# Patient Record
Sex: Female | Born: 1960 | Race: Black or African American | Hispanic: No | Marital: Married | Smoking: Former smoker
Health system: Southern US, Community
[De-identification: ages and names within clinical notes are randomized; demographics above are authoritative.]

## PROBLEM LIST (undated history)

## (undated) DIAGNOSIS — E119 Type 2 diabetes mellitus without complications: Secondary | ICD-10-CM

## (undated) DIAGNOSIS — I10 Essential (primary) hypertension: Secondary | ICD-10-CM

## (undated) DIAGNOSIS — K219 Gastro-esophageal reflux disease without esophagitis: Secondary | ICD-10-CM

## (undated) HISTORY — PX: APPENDECTOMY: SHX54

## (undated) HISTORY — DX: Gastro-esophageal reflux disease without esophagitis: K21.9

---

## 2015-09-19 ENCOUNTER — Encounter (HOSPITAL_COMMUNITY): Payer: Self-pay

## 2015-09-19 ENCOUNTER — Emergency Department (HOSPITAL_COMMUNITY)
Admission: EM | Admit: 2015-09-19 | Discharge: 2015-09-19 | Disposition: A | Discharge status: Home / Self Care | Payer: Medicaid Other | Attending: Emergency Medicine | Admitting: Emergency Medicine

## 2015-09-19 ENCOUNTER — Emergency Department (HOSPITAL_COMMUNITY): Payer: Medicaid Other

## 2015-09-19 DIAGNOSIS — M549 Dorsalgia, unspecified: Secondary | ICD-10-CM | POA: Insufficient documentation

## 2015-09-19 DIAGNOSIS — G4489 Other headache syndrome: Secondary | ICD-10-CM | POA: Diagnosis not present

## 2015-09-19 DIAGNOSIS — R51 Headache: Secondary | ICD-10-CM | POA: Diagnosis present

## 2015-09-19 LAB — COMPREHENSIVE METABOLIC PANEL
ALK PHOS: 46 U/L (ref 38–126)
ALT: 13 U/L — AB (ref 14–54)
AST: 20 U/L (ref 15–41)
Albumin: 3.5 g/dL (ref 3.5–5.0)
Anion gap: 10 (ref 5–15)
BUN: 7 mg/dL (ref 6–20)
CALCIUM: 9 mg/dL (ref 8.9–10.3)
CO2: 25 mmol/L (ref 22–32)
CREATININE: 0.72 mg/dL (ref 0.44–1.00)
Chloride: 103 mmol/L (ref 101–111)
Glucose, Bld: 90 mg/dL (ref 65–99)
Potassium: 3.4 mmol/L — ABNORMAL LOW (ref 3.5–5.1)
SODIUM: 138 mmol/L (ref 135–145)
Total Bilirubin: 0.8 mg/dL (ref 0.3–1.2)
Total Protein: 6.9 g/dL (ref 6.5–8.1)

## 2015-09-19 LAB — CBC
HCT: 38.6 % (ref 36.0–46.0)
HEMOGLOBIN: 12.8 g/dL (ref 12.0–15.0)
MCH: 28.4 pg (ref 26.0–34.0)
MCHC: 33.2 g/dL (ref 30.0–36.0)
MCV: 85.8 fL (ref 78.0–100.0)
Platelets: 255 10*3/uL (ref 150–400)
RBC: 4.5 MIL/uL (ref 3.87–5.11)
RDW: 12.1 % (ref 11.5–15.5)
WBC: 6.4 10*3/uL (ref 4.0–10.5)

## 2015-09-19 MED ORDER — METOCLOPRAMIDE HCL 5 MG/ML IJ SOLN
5.0000 mg | Freq: Once | INTRAMUSCULAR | Status: AC
  Administered 2015-09-19: 5 mg via INTRAVENOUS
  Filled 2015-09-19: qty 2

## 2015-09-19 MED ORDER — SODIUM CHLORIDE 0.9 % IV BOLUS (SEPSIS)
1000.0000 mL | Freq: Once | INTRAVENOUS | Status: AC
  Administered 2015-09-19: 1000 mL via INTRAVENOUS

## 2015-09-19 MED ORDER — DIPHENHYDRAMINE HCL 50 MG/ML IJ SOLN
25.0000 mg | Freq: Once | INTRAMUSCULAR | Status: AC
  Administered 2015-09-19: 25 mg via INTRAVENOUS
  Filled 2015-09-19: qty 1

## 2015-09-19 MED ORDER — BUTALBITAL-APAP-CAFFEINE 50-325-40 MG PO TABS: 1.0000 | ORAL_TABLET | Freq: Four times a day (QID) | ORAL | Status: AC | PRN

## 2015-09-19 NOTE — ED Notes (Signed)
MD at bedside. 

## 2015-09-19 NOTE — ED Provider Notes (Signed)
CSN: [id number]     Arrival date & time 09/19/15  1248 History   First MD Initiated Contact with Patient 09/19/15 1612     Chief Complaint  Patient presents with  . Headache  . Back Pain     (Consider location/radiation/quality/duration/timing/severity/associated sxs/prior Treatment) HPI Comments: Patient here complaining of headache 1 week which began after she sneezed. Has had these headaches before in the past when she was in a refugee For 20 years. Denies any fever or chills. Some mild blurred vision without light flashing. Denies any weakness in her arms or legs. No neck pain. Location of her headache is diffuse and is vaguely described. Symptoms wax and wane and nothing seems to make them better or worse. No treatment use prior to arrival.  Patient is a 55 y.o. female presenting with headaches and back pain. The history is provided by the patient. A language interpreter was used.  Headache Associated symptoms: back pain   Back Pain Associated symptoms: headaches     History reviewed. No pertinent past medical history. History reviewed. No pertinent past surgical history. No family history on file. Social History  Substance Use Topics  . Smoking status: Never Smoker   . Smokeless tobacco: None  . Alcohol Use: None   OB History    No data available     Review of Systems  Musculoskeletal: Positive for back pain.  Neurological: Positive for headaches.  All other systems reviewed and are negative.     Allergies  Review of patient's allergies indicates not on file.  Home Medications   Prior to Admission medications   Not on File   BP 131/74 mmHg  Pulse 75  Temp(Src) 97.5 F (36.4 C) (Oral)  Resp 20  SpO2 100% Physical Exam  Constitutional: She is oriented to person, place, and time. She appears well-developed and well-nourished.  Non-toxic appearance. No distress.  HENT:  Head: Normocephalic and atraumatic.  Eyes: Conjunctivae, EOM and lids are normal.  Pupils are equal, round, and reactive to light.  Neck: Normal range of motion. Neck supple. No tracheal deviation present. No thyroid mass present.  Cardiovascular: Normal rate, regular rhythm and normal heart sounds.  Exam reveals no gallop.   No murmur heard. Pulmonary/Chest: Effort normal and breath sounds normal. No stridor. No respiratory distress. She has no decreased breath sounds. She has no wheezes. She has no rhonchi. She has no rales.  Abdominal: Soft. Normal appearance and bowel sounds are normal. She exhibits no distension. There is no tenderness. There is no rebound and no CVA tenderness.  Musculoskeletal: Normal range of motion. She exhibits no edema or tenderness.  Neurological: She is alert and oriented to person, place, and time. She has normal strength. No cranial nerve deficit or sensory deficit. GCS eye subscore is 4. GCS verbal subscore is 5. GCS motor subscore is 6.  Skin: Skin is warm and dry. No abrasion and no rash noted.  Psychiatric: She has a normal mood and affect. Her speech is normal and behavior is normal.  Nursing note and vitals reviewed.   ED Course  Procedures (including critical care time) Labs Review Labs Reviewed  CBC  COMPREHENSIVE METABOLIC PANEL    Imaging Review No results found. I have personally reviewed and evaluated these images and lab results as part of my medical decision-making.   EKG Interpretation None      MDM   Final diagnoses:  None    Patient given Reglan and Benadryl and feels better. Repeat neurological  exam is stable. Will be given primary care referral as well as return precautions    Lacretia Leigh, MD 09/19/15 2120

## 2015-09-19 NOTE — ED Notes (Signed)
Patient here with headache and back pain x 1 week pain with ambulation.  Alert and oriented, denies visual changes, ambulatory all info from interpretor phone

## 2015-09-19 NOTE — ED Notes (Signed)
Interpreter line used for discharge instructions given by Md

## 2015-09-19 NOTE — Discharge Instructions (Signed)

## 2015-11-19 DIAGNOSIS — R1030 Lower abdominal pain, unspecified: Secondary | ICD-10-CM

## 2015-11-20 NOTE — Congregational Nurse Program (Signed)
Congregational Nurse Program Note  Date of Encounter: 11/19/2015  Past Medical History: No past medical history on file.  Encounter Details:     CNP Questionnaire - 11/20/15 2349    Patient Demographics   Is this a new or existing patient? New   Patient is considered a/an Refugee   Race African   Patient Assistance   Location of Patient Assistance Not Applicable   Patient's financial/insurance status Low Income;Medicaid   Uninsured Patient No   Patient referred to apply for the following financial assistance Not Applicable   Food insecurities addressed Not Applicable   Transportation assistance No   Assistance securing medications No   Educational health offerings Nutrition   Encounter Details   Primary purpose of visit [place]   Was an Emergency Department visit averted? Not Applicable   Does patient have a medical provider? No   Patient referred to Establish PCP   Was a mental health screening completed? (GAINS tool) No   Does patient have dental issues? No   Does patient have vision issues? No   Since previous encounter, have you referred patient for abnormal blood pressure that resulted in a new diagnosis or medication change? No   Since previous encounter, have you referred patient for abnormal blood glucose that resulted in a new diagnosis or medication change? No     11-19-15 Initial office visit for this lady speaking Swahili requesting assistance in establishing PCP for family including self, spouse and son. Medicaid information unavailable. Referral given from [facility] at [facility]. Complained of low back and low abdominal pain especially. Described problem with constipation. Due to clients limited time today, assessment incomplete. Needed more time for interpretation and Family's Medicaid information. Return 11-21-15 to schedule PCP and further assessment. Provide Medicaid copies.  Increase water intake. Jannetta Quint, RN/CN

## 2015-11-21 ENCOUNTER — Encounter (HOSPITAL_COMMUNITY): Payer: Self-pay | Admitting: *Deleted

## 2015-11-21 ENCOUNTER — Ambulatory Visit (HOSPITAL_COMMUNITY)
Admission: EM | Admit: 2015-11-21 | Discharge: 2015-11-21 | Disposition: A | Discharge status: Home / Self Care | Payer: Medicaid Other | Attending: Family Medicine | Admitting: Family Medicine

## 2015-11-21 DIAGNOSIS — F418 Other specified anxiety disorders: Secondary | ICD-10-CM

## 2015-11-21 DIAGNOSIS — R1030 Lower abdominal pain, unspecified: Secondary | ICD-10-CM

## 2015-11-21 DIAGNOSIS — IMO0001 Reserved for inherently not codable concepts without codable children: Secondary | ICD-10-CM

## 2015-11-21 DIAGNOSIS — F329 Major depressive disorder, single episode, unspecified: Secondary | ICD-10-CM

## 2015-11-21 DIAGNOSIS — F419 Anxiety disorder, unspecified: Principal | ICD-10-CM

## 2015-11-21 LAB — POCT I-STAT, CHEM 8
BUN: 13 mg/dL (ref 6–20)
CREATININE: 0.7 mg/dL (ref 0.44–1.00)
Calcium, Ion: 1.22 mmol/L (ref 1.12–1.23)
Chloride: 99 mmol/L — ABNORMAL LOW (ref 101–111)
Glucose, Bld: 90 mg/dL (ref 65–99)
HEMATOCRIT: 45 % (ref 36.0–46.0)
HEMOGLOBIN: 15.3 g/dL — AB (ref 12.0–15.0)
POTASSIUM: 3.7 mmol/L (ref 3.5–5.1)
Sodium: 140 mmol/L (ref 135–145)
TCO2: 28 mmol/L (ref 0–100)

## 2015-11-21 LAB — POCT URINALYSIS DIP (DEVICE)
Bilirubin Urine: NEGATIVE
GLUCOSE, UA: NEGATIVE mg/dL
Ketones, ur: NEGATIVE mg/dL
LEUKOCYTES UA: NEGATIVE
Nitrite: NEGATIVE
Protein, ur: NEGATIVE mg/dL
UROBILINOGEN UA: 0.2 mg/dL (ref 0.0–1.0)
pH: 6.5 (ref 5.0–8.0)

## 2015-11-21 MED ORDER — POLYETHYLENE GLYCOL 3350 17 G PO PACK: 17.0000 g | PACK | Freq: Every day | ORAL | Status: DC

## 2015-11-21 NOTE — Discharge Instructions (Signed)
Use medicine daily and see dr Mingo Amber for further checkup.

## 2015-11-21 NOTE — Congregational Nurse Program (Signed)
Congregational Nurse Program Note  Date of Encounter: 11/21/2015  Past Medical History: No past medical history on file.  Encounter Details:     CNP Questionnaire - 11/21/15 1634    Patient Demographics   Is this a new or existing patient? New   Patient is considered a/an Refugee   Race African   Patient Assistance   Location of Patient Assistance Not Applicable   Patient's financial/insurance status Medicaid   Uninsured Patient No   Patient referred to apply for the following financial assistance Not Applicable   Food insecurities addressed Not Applicable   Transportation assistance Yes   Type of Assistance Taxi Voucher Given   Assistance securing medications No   Educational health offerings Navigating the healthcare system;Medications   Encounter Details   Primary purpose of visit Acute Illness/Condition Visit   Was an Emergency Department visit averted? Not Applicable   Does patient have a medical provider? No   Patient referred to Establish PCP   Was a mental health screening completed? (GAINS tool) No   Does patient have dental issues? No   Was a dental referral made? Yes   Does patient have vision issues? No   Does your patient have an abnormal blood pressure today? No   Since previous encounter, have you referred patient for abnormal blood pressure that resulted in a new diagnosis or medication change? No   Does your patient have an abnormal blood glucose today? No   Since previous encounter, have you referred patient for abnormal blood glucose that resulted in a new diagnosis or medication change? No   Was there a life-saving intervention made? No     Received telephone call from [facility] on [facility], patient is being seen and does not have transportation home.  Patient was originally referred to Urgent Care by Jannetta Quint RN, CNP from [facility]. Traveled to Urgent Care to assist with patient needs.  Urgent Care discharge  instructions instructed patient to make an appointment with Dr. Annabell Sabal.  Via interpreter, scheduled appointment for Wednesday, Nov 27, 2015 at 2:00pm and reviewed with patient that her Rx would be picked up from pharmacy by Jannetta Quint and she would deliver it to her home.  Via interpreter, reviewed Rx administration instructions.  Patient verbalized understanding about next weeks doctor's appointment, how to take medication, how medication was going to be delivered to her home by [facility], she is to go see Jannetta Quint at the [place] next Tuesday and at that time she will receive a bus pass or taxi voucher to travel to her appointment on Wednesday and that we would be calling a taxi for her to take her home today.  Stayed with patient until Taxi arrived. Lessie Dings RN, [place]  [phone]

## 2015-11-21 NOTE — Congregational Nurse Program (Signed)
Congregational Nurse Program Note  Date of Encounter: 11/21/2015  Past Medical History: No past medical history on file.  Encounter Details:     CNP Questionnaire - 11/21/15 1436    Patient Demographics   Is this a new or existing patient? Existing   Patient is considered a/an Refugee   Race African   Patient Assistance   Patient's financial/insurance status Medicaid   Uninsured Patient No   Patient referred to apply for the following financial assistance Not Applicable   Food insecurities addressed Not Applicable   Transportation assistance Yes   Type of Assistance Taxi Voucher Given   Assistance securing medications No   Educational health offerings Navigating the healthcare system   Encounter Details   Primary purpose of visit Acute Illness/Condition Visit   Was an Emergency Department visit averted? Not Applicable   Does patient have a medical provider? No   Patient referred to Urgent Care   Was a mental health screening completed? (GAINS tool) No   Does patient have dental issues? No   Was a dental referral made? Yes   Does patient have vision issues? No   Since previous encounter, have you referred patient for abnormal blood pressure that resulted in a new diagnosis or medication change? No   Since previous encounter, have you referred patient for abnormal blood glucose that resulted in a new diagnosis or medication change? No     Return visit for this [place] speaking lady from the [place] as a [facility]. Continue to experience moderate to severe lower abdominal and back pain unresolved over several months. Thin and frail appearance with facial stress.  Pain felt upon pressure to lower abdomen. Denies nausea and vomiting. No history of surgery. "I feel like worms in my stomach, bubbling feeling." Hx of constipation. Had laboratory test in past. Unable to provide details. Resides with son and spouse. Return to continue follow-up and assistance scheduling family  PCP on May  09,2017. Refer to Urgent Care for evaluation today. Jannetta Quint, RN. [phone].

## 2015-11-21 NOTE — ED Notes (Signed)
PACIFIC    INTERPRETORS    UTILIZED

## 2015-11-21 NOTE — ED Notes (Addendum)
Pt  Reports       abd   Pain    And  Weakness      She  denys  Any  Fever  Or  Vomiting         She    Was  Referred  By    Congregational       Nurse          She  Reports  Back  Hurts                 She  Has  Only  Been  In  The  [place]  For  3       Months       She  Has  Been  C/o  Low  Back  And  Abdominal pain

## 2015-11-21 NOTE — ED Provider Notes (Signed)
CSN: [id number]     Arrival date & time 11/21/15  1340 History   First MD Initiated Contact with Patient 11/21/15 1431     Chief Complaint  Patient presents with  . Weakness   (Consider location/radiation/quality/duration/timing/severity/associated sxs/prior Treatment) Patient is a 55 y.o. female presenting with weakness. The history is provided by the patient. The history is limited by a language barrier. A language interpreter was used (swahili.).  Weakness This is a chronic problem. Episode onset: in refugee center [place]yr, in [place] for 55mos, current problems for prolonged period. Associated symptoms include chest pain, abdominal pain and headaches.    History reviewed. No pertinent past medical history. History reviewed. No pertinent past surgical history. History reviewed. No pertinent family history. Social History  Substance Use Topics  . Smoking status: Never Smoker   . Smokeless tobacco: None  . Alcohol Use: No   OB History    No data available     Review of Systems  Constitutional: Negative for fever.  Cardiovascular: Positive for chest pain.  Gastrointestinal: Positive for abdominal pain and constipation. Negative for nausea, vomiting and diarrhea.  Neurological: Positive for weakness and headaches.  All other systems reviewed and are negative.   Allergies  Review of patient's allergies indicates no known allergies.  Home Medications   Prior to Admission medications   Medication Sig Start Date End Date Taking? Authorizing Provider  butalbital-acetaminophen-caffeine (FIORICET) 50-325-40 MG tablet Take 1 tablet by mouth every 6 (six) hours as needed for headache. 09/19/15 09/18/16  Lacretia Leigh, MD  polyethylene glycol [facility] / Floria Raveling) packet Take 17 g by mouth daily. 11/21/15   Billy Fischer, MD   Meds Ordered and Administered this Visit  Medications - No data to display  BP 145/65 mmHg  Pulse 68  Temp(Src) 97.7 F (36.5 C) (Oral)  Resp 16  SpO2 100%  LMP   (Within Weeks) No data found.   Physical Exam  Constitutional: She is oriented to person, place, and time. She appears well-developed and well-nourished. No distress.  Neck: Normal range of motion. Neck supple.  Cardiovascular: Normal rate, regular rhythm, normal heart sounds and intact distal pulses.   Pulmonary/Chest: Effort normal and breath sounds normal.  Lymphadenopathy:    She has no cervical adenopathy.  Neurological: She is alert and oriented to person, place, and time.  Skin: Skin is warm and dry.  Nursing note and vitals reviewed.   ED Course  Procedures (including critical care time)  Labs Review Labs Reviewed  POCT URINALYSIS DIP (DEVICE) - Abnormal; Notable for the following:    Hgb urine dipstick TRACE (*)    All other components within normal limits  POCT I-STAT, CHEM 8 - Abnormal; Notable for the following:    Chloride 99 (*)    Hemoglobin 15.3 (*)    All other components within normal limits    Imaging Review No results found.   Visual Acuity Review  Right Eye Distance:   Left Eye Distance:   Bilateral Distance:    Right Eye Near:   Left Eye Near:    Bilateral Near:         MDM   1. Anxiety and depression        Billy Fischer, MD 11/21/15 1505

## 2015-11-26 NOTE — Congregational Nurse Program (Signed)
Congregational Nurse Program Note  Date of Encounter: 11/21/2015  Past Medical History: No past medical history on file.  Encounter Details:     CNP Questionnaire - 11/21/15 1634    Patient Demographics   Is this a new or existing patient? New   Patient is considered a/an Refugee   Race African   Patient Assistance   Location of Patient Assistance Not Applicable   Patient's financial/insurance status Medicaid   Uninsured Patient No   Patient referred to apply for the following financial assistance Not Applicable   Food insecurities addressed Not Applicable   Transportation assistance Yes   Type of Assistance Taxi Voucher Given   Assistance securing medications No   Educational health offerings Navigating the healthcare system;Medications   Encounter Details   Primary purpose of visit Acute Illness/Condition Visit   Was an Emergency Department visit averted? Not Applicable   Does patient have a medical provider? No   Patient referred to Establish PCP   Was a mental health screening completed? (GAINS tool) No   Does patient have dental issues? No   Was a dental referral made? Yes   Does patient have vision issues? No   Does your patient have an abnormal blood pressure today? No   Since previous encounter, have you referred patient for abnormal blood pressure that resulted in a new diagnosis or medication change? No   Does your patient have an abnormal blood glucose today? No   Since previous encounter, have you referred patient for abnormal blood glucose that resulted in a new diagnosis or medication change? No   Was there a life-saving intervention made? No      Transportation provided to client from [facility] to home. Prescription for Miralax/Glycolax packets taken to [facility], filled  and delivered to home. Instructions and details of med and purpose reviewed with client several times. Feedback indicated understanding. Appointment for Family Medicine scheduled for  Nov 27, 2015, 2:00 pm; [address] Assistance with transportation provided for appointment. Return to [place] office for taxi vouchers ON 11-26-15. Jannetta Quint, RN/CN. [phone].

## 2015-11-27 ENCOUNTER — Other Ambulatory Visit (HOSPITAL_COMMUNITY)
Admission: RE | Admit: 2015-11-27 | Discharge: 2015-11-27 | Disposition: A | Discharge status: Home / Self Care | Payer: Medicaid Other | Source: Ambulatory Visit | Attending: Family Medicine | Admitting: Family Medicine

## 2015-11-27 ENCOUNTER — Ambulatory Visit (HOSPITAL_COMMUNITY)
Admission: RE | Admit: 2015-11-27 | Discharge: 2015-11-27 | Disposition: A | Discharge status: Home / Self Care | Payer: Medicaid Other | Source: Ambulatory Visit | Attending: Family Medicine | Admitting: Family Medicine

## 2015-11-27 ENCOUNTER — Ambulatory Visit (INDEPENDENT_AMBULATORY_CARE_PROVIDER_SITE_OTHER): Payer: Medicaid Other | Admitting: Family Medicine

## 2015-11-27 VITALS — BP 121/71 | HR 83 | Temp 97.3°F | Ht 64.0 in | Wt 128.4 lb

## 2015-11-27 DIAGNOSIS — M545 Low back pain: Secondary | ICD-10-CM | POA: Diagnosis not present

## 2015-11-27 DIAGNOSIS — Z113 Encounter for screening for infections with a predominantly sexual mode of transmission: Secondary | ICD-10-CM | POA: Diagnosis not present

## 2015-11-27 DIAGNOSIS — M549 Dorsalgia, unspecified: Secondary | ICD-10-CM | POA: Insufficient documentation

## 2015-11-27 DIAGNOSIS — R1084 Generalized abdominal pain: Secondary | ICD-10-CM | POA: Diagnosis not present

## 2015-11-27 DIAGNOSIS — Z0289 Encounter for other administrative examinations: Secondary | ICD-10-CM

## 2015-11-27 DIAGNOSIS — R109 Unspecified abdominal pain: Secondary | ICD-10-CM | POA: Insufficient documentation

## 2015-11-27 DIAGNOSIS — R002 Palpitations: Secondary | ICD-10-CM

## 2015-11-27 DIAGNOSIS — L905 Scar conditions and fibrosis of skin: Secondary | ICD-10-CM

## 2015-11-27 DIAGNOSIS — G8929 Other chronic pain: Secondary | ICD-10-CM | POA: Insufficient documentation

## 2015-11-27 DIAGNOSIS — Z008 Encounter for other general examination: Secondary | ICD-10-CM | POA: Diagnosis present

## 2015-11-27 HISTORY — DX: Dorsalgia, unspecified: M54.9

## 2015-11-27 HISTORY — DX: Palpitations: R00.2

## 2015-11-27 HISTORY — DX: Scar conditions and fibrosis of skin: L90.5

## 2015-11-27 HISTORY — DX: Encounter for other administrative examinations: Z02.89

## 2015-11-27 LAB — CBC WITH DIFFERENTIAL/PLATELET
BASOS PCT: 0 %
Basophils Absolute: 0 cells/uL (ref 0–200)
Eosinophils Absolute: 54 cells/uL (ref 15–500)
Eosinophils Relative: 1 %
HCT: 40.8 % (ref 35.0–45.0)
Hemoglobin: 13.5 g/dL (ref 11.7–15.5)
LYMPHS PCT: 31 %
Lymphs Abs: 1674 cells/uL (ref 850–3900)
MCH: 28.8 pg (ref 27.0–33.0)
MCHC: 33.1 g/dL (ref 32.0–36.0)
MCV: 87.2 fL (ref 80.0–100.0)
MONOS PCT: 5 %
MPV: 9.9 fL (ref 7.5–12.5)
Monocytes Absolute: 270 cells/uL (ref 200–950)
Neutro Abs: 3402 cells/uL (ref 1500–7800)
Neutrophils Relative %: 63 %
PLATELETS: 262 10*3/uL (ref 140–400)
RBC: 4.68 MIL/uL (ref 3.80–5.10)
RDW: 13.4 % (ref 11.0–15.0)
WBC: 5.4 10*3/uL (ref 3.8–10.8)

## 2015-11-27 LAB — POCT H PYLORI SCREEN: H PYLORI SCREEN, POC: NEGATIVE

## 2015-11-27 LAB — TSH: TSH: 0.36 m[IU]/L — AB

## 2015-11-27 MED ORDER — ACETAMINOPHEN 500 MG PO TABS: 500.0000 mg | ORAL_TABLET | Freq: Four times a day (QID) | ORAL | Status: DC | PRN

## 2015-11-27 NOTE — Patient Instructions (Addendum)
Take the Tylenol for your back pain.   Come back in about a month.  We will have the results of your lab tests at that visit.
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
## 2015-11-28 LAB — HIV ANTIBODY (ROUTINE TESTING W REFLEX): HIV 1&2 Ab, 4th Generation: NONREACTIVE

## 2015-11-28 LAB — COMPREHENSIVE METABOLIC PANEL
ALK PHOS: 46 U/L (ref 33–130)
ALT: 14 U/L (ref 6–29)
AST: 17 U/L (ref 10–35)
Albumin: 4.1 g/dL (ref 3.6–5.1)
BILIRUBIN TOTAL: 0.3 mg/dL (ref 0.2–1.2)
BUN: 19 mg/dL (ref 7–25)
CHLORIDE: 103 mmol/L (ref 98–110)
CO2: 27 mmol/L (ref 20–31)
CREATININE: 0.86 mg/dL (ref 0.50–1.05)
Calcium: 9.2 mg/dL (ref 8.6–10.4)
Glucose, Bld: 73 mg/dL (ref 65–99)
Potassium: 3.9 mmol/L (ref 3.5–5.3)
SODIUM: 138 mmol/L (ref 135–146)
Total Protein: 7.1 g/dL (ref 6.1–8.1)

## 2015-11-28 LAB — HEPATITIS B SURFACE ANTIGEN: Hepatitis B Surface Ag: NEGATIVE

## 2015-11-28 LAB — RPR

## 2015-11-28 LAB — VARICELLA ZOSTER ANTIBODY, IGG: VARICELLA IGG: 510.2 {index} — AB (ref <135.00)

## 2015-11-28 LAB — BRAIN NATRIURETIC PEPTIDE: Brain Natriuretic Peptide: 23.1 pg/mL (ref <100)

## 2015-11-28 LAB — SICKLE CELL SCREEN: SICKLE CELL SCREEN: NEGATIVE

## 2015-11-29 LAB — URINE CYTOLOGY ANCILLARY ONLY
CHLAMYDIA, DNA PROBE: NEGATIVE
Neisseria Gonorrhea: NEGATIVE

## 2015-11-29 LAB — URINE CULTURE: Colony Count: 8000
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
## 2015-11-29 NOTE — Assessment & Plan Note (Signed)
RUQ pain on abdominal exam. Checking h pylori.  Fairly focal exam.  Depending on h pylori results, send for RUQ U/S.

## 2015-12-04 ENCOUNTER — Encounter: Payer: Self-pay | Admitting: Family Medicine

## 2015-12-11 DIAGNOSIS — R1030 Lower abdominal pain, unspecified: Secondary | ICD-10-CM

## 2015-12-11 NOTE — Congregational Nurse Program (Unsigned)
Congregational Nurse Program Note  Date of Encounter: 12/11/2015  Past Medical History: No past medical history on file.  Encounter Details:     CNP Questionnaire - 12/11/15 1405    Patient Demographics   Is this a new or existing patient? Existing   Patient is considered a/an Refugee   Race African   Patient Assistance   Location of Patient Assistance Not Applicable   Patient's financial/insurance status Low Income;Medicaid   Uninsured Patient No   Patient referred to apply for the following financial assistance Not Applicable   Food insecurities addressed Not Applicable   Transportation assistance No   Assistance securing medications No   Educational health offerings Medications   Encounter Details   Primary purpose of visit Education/Health Concerns   Was an Emergency Department visit averted? Not Applicable   Patient referred to Not Applicable   Was a mental health screening completed? (GAINS tool) No   Does patient have dental issues? Yes   Was a dental referral made? Yes   Does patient have vision issues? No   Does your patient have an abnormal blood pressure today? No   Since previous encounter, have you referred patient for abnormal blood pressure that resulted in a new diagnosis or medication change? No   Does your patient have an abnormal blood glucose today? No   Since previous encounter, have you referred patient for abnormal blood glucose that resulted in a new diagnosis or medication change? No   Was there a life-saving intervention made? No         Amb Nursing Assessment - 11/27/15 1445    Pre-visit preparation   Pre-visit preparation completed Yes   Abuse/Neglect Assessment   Do you feel unsafe in your current relationship? No   Do you feel physically threatened by others? No   Anyone hurting you at home, work, or school? No   Investment banker, operational Needed? Yes   Deborah Sampson   Interpreter ID  [phone]     Office visit for this lady Refuge from [facility].Expressed concern that recent medications prescribed causing drowsiness. "I feel sleepy all the time." Concerns about safety if sleeping too much while spouse away working at night. Requesting to d/c medications until week-end. Completed dosing of Myralax/Glycolax. Reports abdomen feeling much better. Overall expression improved and smiling without facial tension visible as before. Explained use of medications can be used as needed for headache every 6 hours versus routinely. Stressed drinking 4-6 or more glasses water daily,increase fresh veggies and fruits in diet.Return to see nurse 12-17-15 to review status. Jannetta Quint, RN/CN . [phone]

## 2016-01-14 DIAGNOSIS — R1030 Lower abdominal pain, unspecified: Secondary | ICD-10-CM

## 2016-01-14 NOTE — Congregational Nurse Program (Signed)
Congregational Nurse Program Note  Date of Encounter: 01/14/2016  Past Medical History: No past medical history on file.  Encounter Details:     CNP Questionnaire - 01/14/16 1435    Patient Demographics   Is this a new or existing patient? Existing   Patient is considered a/an Refugee   Race African   Patient Assistance   Location of Patient Assistance Not Applicable   Patient's financial/insurance status Low Income;Medicaid   Uninsured Patient No   Patient referred to apply for the following financial assistance Not Applicable   Food insecurities addressed Not Applicable   Transportation assistance No   Assistance securing medications No   Educational health offerings Medications   Encounter Details   Primary purpose of visit Education/Health Concerns   Was an Emergency Department visit averted? Not Applicable   Does patient have a medical provider? No   Patient referred to Not Applicable   Was a mental health screening completed? (GAINS tool) No   Does patient have dental issues? Yes   Does patient have vision issues? No   Does your patient have an abnormal blood pressure today? No   Since previous encounter, have you referred patient for abnormal blood pressure that resulted in a new diagnosis or medication change? No   Does your patient have an abnormal blood glucose today? No   Since previous encounter, have you referred patient for abnormal blood glucose that resulted in a new diagnosis or medication change? No   Was there a life-saving intervention made? No     Office visit to follow-up recent dental appointment. Appointment scheduled January 02, 2016 for dental care cancelled due to absence of interpreter. Family members unavailable to accompany to appointment due to work schedule. Reports feeling better after PCP visit and medication for constipation. Constipation continues to occur. Using Tylenol for pain on week-ends to avoid drowsiness while attending school and during  time home alone while husband working. Expressed concern about safety if in " deep sleep". Follow-up appointment scheduled with Dr. [facility] on January 24, 2016 at 10 am for re-evaluation of abdominal pain and medication. Jannetta Quint, RN/CN.

## 2016-01-24 ENCOUNTER — Ambulatory Visit (INDEPENDENT_AMBULATORY_CARE_PROVIDER_SITE_OTHER): Payer: Medicaid Other | Admitting: Family Medicine

## 2016-01-24 ENCOUNTER — Encounter: Payer: Self-pay | Admitting: Family Medicine

## 2016-01-24 VITALS — BP 131/62 | HR 70 | Temp 97.9°F | Ht 64.0 in | Wt 134.2 lb

## 2016-01-24 DIAGNOSIS — K5900 Constipation, unspecified: Secondary | ICD-10-CM | POA: Insufficient documentation

## 2016-01-24 DIAGNOSIS — K5901 Slow transit constipation: Secondary | ICD-10-CM | POA: Diagnosis not present

## 2016-01-24 DIAGNOSIS — R002 Palpitations: Secondary | ICD-10-CM

## 2016-01-24 DIAGNOSIS — K5909 Other constipation: Secondary | ICD-10-CM | POA: Insufficient documentation

## 2016-01-24 LAB — TSH: TSH: 0.7 m[IU]/L

## 2016-01-24 MED ORDER — DOCUSATE SODIUM 100 MG PO CAPS: 100.0000 mg | ORAL_CAPSULE | Freq: Two times a day (BID) | ORAL | Status: DC

## 2016-01-24 NOTE — Assessment & Plan Note (Signed)
To continue Miralax.  Can increase to 2-3 x daily if needed. Also adding colace. She needs colonoscopy based on age.  She is following up with me in 1 month and we'll discuss further at that visit.

## 2016-01-24 NOTE — Patient Instructions (Addendum)
We are rechecking your thyroid and I will call you with the results.  Take the colace 2 pills in the morning to help with constipation.  If you are still struggling, you can take 2 or 3 glasses of the powder to help you go.  Come back and see me in a month and we'll talk about a colonoscopy.

## 2016-01-24 NOTE — Progress Notes (Signed)
Subjective:   [place] interpreter [place] [phone] today.  Deborah Sampson is a 55 y.o. female who presents to [facility] today for lower abdominal pain:  1.  Constipation:  Not particularly better.  Going every 2-3 days at times.  Difficult for her to use the bathroom when she tries.  Very small amounts, denies any melena or hematochezia.  States she is using her medicine, but only using one glass per day.  Not taking anything else for relief.    2.  Palpitations:  Persists.  Worse during constipation.   No shortness of breath or chest pain.  Occurs once every 2-3 weeks, except for straining with constipation, which is more frequent.  No heat/cold intolerance.    ROS as above per HPI, otherwise neg.   The following portions of the patient's history were reviewed and updated as appropriate: allergies, current medications, past medical history, family and social history, and problem list. Patient is a nonsmoker.    PMH reviewed.  No past medical history on file. No past surgical history on file.  Medications reviewed. Current Outpatient Prescriptions  Medication Sig Dispense Refill  . acetaminophen (TYLENOL) 500 MG tablet Take 1 tablet (500 mg total) by mouth every 6 (six) hours as needed. 30 tablet 0  . butalbital-acetaminophen-caffeine (FIORICET) 50-325-40 MG tablet Take 1 tablet by mouth every 6 (six) hours as needed for headache. 20 tablet 0  . polyethylene glycol (MIRALAX / GLYCOLAX) packet Take 17 g by mouth daily. 14 each 0   No current facility-administered medications for this visit.     Objective:   Physical Exam Ht 5\' 4"  (1.626 m)  Wt 134 lb 3.2 oz (60.873 kg)  BMI 23.02 kg/m2  LMP 01/24/2016 Gen:  Alert, cooperative patient who appears stated age in no acute distress.  Vital signs reviewed. HEENT: EOMI,  MMM Neck:  No thyroid nodules Cardiac:  Regular rate and rhythm without murmur auscultated.  Good S1/S2. Pulm:  Clear to auscultation bilaterally with good air movement.   No wheezes or rales noted.   Ext:  No edema.   No results found for this or any previous visit (from the past 72 hour(s)).

## 2016-01-24 NOTE — Assessment & Plan Note (Signed)
Rechecking TSH today.  If still low, will need treatment.  Likely cause of palpitations.  Normal EKG last visit.

## 2016-01-25 LAB — HEPATITIS C ANTIBODY: HCV Ab: NEGATIVE

## 2016-01-27 ENCOUNTER — Encounter: Payer: Self-pay | Admitting: Family Medicine

## 2016-01-28 DIAGNOSIS — R1030 Lower abdominal pain, unspecified: Secondary | ICD-10-CM

## 2016-01-28 DIAGNOSIS — IMO0001 Reserved for inherently not codable concepts without codable children: Secondary | ICD-10-CM

## 2016-01-28 NOTE — Congregational Nurse Program (Signed)
Congregational Nurse Program Note  Date of Encounter: 01/28/2016  Past Medical History: No past medical history on file.  Encounter Details:     CNP Questionnaire - 01/28/16 1100    Patient Demographics   Is this a new or existing patient? Existing   Patient is considered a/an Refugee   Race African   Patient Assistance   Location of Patient Assistance Not Applicable   Patient's financial/insurance status Low Income;Medicaid   Uninsured Patient No   Patient referred to apply for the following financial assistance Not Applicable   Food insecurities addressed Not Applicable   Transportation assistance No   Assistance securing medications No   Educational health offerings Medications   Encounter Details   Primary purpose of visit Education/Health Concerns   Was an Emergency Department visit averted? Not Applicable   Does patient have a medical provider? No   Patient referred to Not Applicable   Was a mental health screening completed? (GAINS tool) No   Does patient have dental issues? Yes   Was a dental referral made? Yes   Does patient have vision issues? No   Does your patient have an abnormal blood pressure today? No   Since previous encounter, have you referred patient for abnormal blood pressure that resulted in a new diagnosis or medication change? No   Does your patient have an abnormal blood glucose today? No   Since previous encounter, have you referred patient for abnormal blood glucose that resulted in a new diagnosis or medication change? No   Was there a life-saving intervention made? No         Amb Nursing Assessment - 01/24/16 1036    Pre-visit preparation   Pre-visit preparation completed Yes   Abuse/Neglect Assessment   Do you feel unsafe in your current relationship? No   Do you feel physically threatened by others? No   Anyone hurting you at home, work, or school? 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 Reports abdominal pain and constipation improved with Colace, but prefers to have liquid medication, Miralax originally provided. Main concern today is to receive assistance in scheduling a dental appointment for tooth pain before Medicaid expires. Last appointment canceled due to absence of interpreter.  Plan: Cautioned regarding use of frequent laxatives and importance of taking Colace as directed. Drink 8 plus glasses of water daily and increase fiber in diet; less rice.  Appointment scheduled with Dr.Green; January 30, 2016 at 9:00 am. Request family member to accompany and interpret. Jannetta Quint, RN/CN

## 2016-01-30 DIAGNOSIS — K029 Dental caries, unspecified: Secondary | ICD-10-CM

## 2016-02-03 NOTE — Congregational Nurse Program (Signed)
Congregational Nurse Program Note  Date of Encounter: 01/30/2016  Past Medical History: No past medical history on file.  Encounter Details:     CNP Questionnaire - 01/30/16 0930    Patient Demographics   Is this a new or existing patient? Existing   Patient is considered a/an Refugee   Race African   Patient Assistance   Location of Patient Assistance Not Applicable   Patient's financial/insurance status Low Income;Medicaid   Uninsured Patient No   Patient referred to apply for the following financial assistance Not Applicable   Food insecurities addressed Not Applicable   Transportation assistance No   Assistance securing medications No   Educational health offerings Medications   Encounter Details   Primary purpose of visit Education/Health Concerns   Was an Emergency Department visit averted? Not Applicable   Does patient have a medical provider? No   Patient referred to Not Applicable   Was a mental health screening completed? (GAINS tool) No   Does patient have dental issues? Yes   Was a dental referral made? Yes   Does patient have vision issues? No   Does your patient have an abnormal blood pressure today? No   Since previous encounter, have you referred patient for abnormal blood pressure that resulted in a new diagnosis or medication change? No   Does your patient have an abnormal blood glucose today? No   Since previous encounter, have you referred patient for abnormal blood glucose that resulted in a new diagnosis or medication change? No   Was there a life-saving intervention made? No         Amb Nursing Assessment - 01/24/16 1036    Pre-visit preparation   Pre-visit preparation completed Yes   Abuse/Neglect Assessment   Do you feel unsafe in your current relationship? No   Do you feel physically threatened by others? No   Anyone hurting you at home, work, or school? No   Investment banker, operational Needed? Yes   [place]   Interpreter Name [place]   Interpreter ID [phone]     Follow-up with dentist, Dr. Rolly Salter office for appointment scheduled today at 9:00 am. Office contacted by family member, but unable to provide interpreter at time of appointment. Requested appointment be rescheduled. Jannetta Quint, RN/CN

## 2016-09-28 ENCOUNTER — Ambulatory Visit (INDEPENDENT_AMBULATORY_CARE_PROVIDER_SITE_OTHER): Payer: Medicaid Other | Admitting: Family Medicine

## 2016-09-28 ENCOUNTER — Encounter: Payer: Self-pay | Admitting: Family Medicine

## 2016-09-28 DIAGNOSIS — K5901 Slow transit constipation: Secondary | ICD-10-CM

## 2016-09-28 MED ORDER — POLYETHYLENE GLYCOL 3350 17 G PO PACK: 17.0000 g | PACK | Freq: Every day | ORAL | 0 refills | Status: DC

## 2016-09-28 MED ORDER — MINERAL OIL RE ENEM: 1.0000 | ENEMA | Freq: Once | RECTAL | 0 refills | Status: AC

## 2016-09-28 NOTE — Progress Notes (Signed)
   Subjective:    Patient ID: Deborah Sampson , female   DOB: 12-15-1960 , 56 y.o..   MRN: [id number]  HPI  Euphemia Lingerfelt is here for a same day visit for stomach pain  This visit was performed with a [place] phone interpreter  1. Stomach pain: Patient notes that for about 6 days she has had left-sided abdominal pain. She thinks that this is related to sometimes her body feeling hot and achy. She has had no sick contacts. She admits to nausea but no vomiting. Admits to feeling bloated. Has not tried any new foods recently. Denies any diarrhea. Admits to constipation and notes that her last bowel movement was 2 days ago and was small and dry. She has a history of constipation and has been taking MiraLAX in the past but has not taken any recently. No hematochezia or melena stools. Her by mouth intake has been decreased because she feels so bloated. She is mostly drinking milk. Denies shortness of breath, chest pain.  Review of Systems: Per HPI. All other systems reviewed and are negative.  Social Hx:  reports that she has never smoked. She has never used smokeless tobacco.   Objective:   BP 124/76   Pulse 74   Temp 97.7 F (36.5 C) (Oral)   Ht 5\' 4"  (1.626 m)   Wt 152 lb 12.8 oz (69.3 kg)   SpO2 98%   BMI 26.23 kg/m  Physical Exam  Gen: NAD, alert, cooperative with exam, well-appearing HEENT: NCAT, PERRL, clear conjunctiva, oropharynx clear, supple neck Cardiac: Regular rate and rhythm, no edema, capillary refill brisk  Respiratory: Clear to auscultation bilaterally, no wheezes, non-labored breathing Gastrointestinal: soft, tender to deep palpation in the left upper quadrant, nontender everywhere else, non distended, bowel sounds present Skin: no rashes, normal turgor  Neurological: no gross deficits.  Psych: good insight, normal mood and affect  Assessment & Plan:  Constipation Abdominal pain likely due to constipation. Abdominal exam reveals no peritoneal signs and no  signs of acute abdomen. -Discussed MiraLAX cleanout with patient in detail.  -Advised to use a daily MiraLAX afterwards.  -Discussed that if she still has not had a bowel movement after using MiraLAX and to use an enema. -If she has still not had a bowel movement after an enema return to clinic next -paper prescriptions for MiraLAX and enema provided  Meds ordered this encounter  Medications  . polyethylene glycol (MIRALAX / GLYCOLAX) packet    Sig: Take 17 g by mouth daily.    Dispense:  100 each    Refill:  0  . mineral oil enema    Sig: Place 133 mLs (1 enema total) rectally once.    Dispense:  133 mL    Refill:  0    Smitty Cords, MD [place], PGY-2

## 2016-09-28 NOTE — Patient Instructions (Addendum)
You have constipation which is hard stools that are difficult to pass. It is important to have regular bowel movements every 1-3 days that are soft and easy to pass. Hard stools increase your risk of hemorrhoids and are very uncomfortable.   To prevent constipation you can increase the amount of fiber in your diet. Examples of foods with fiber are leafy greens, whole grain breads, oatmeal and other grains.  It is also important to drink at least eight 8oz glass of water everyday.   If you have not has a bowel movement in 4-5 days you made need to clean out your bowel.  This will have establish normal movement through your bowel.    Miralax Clean out  Take 8 capfuls of miralax in 64 oz of gatorade. You can use any fluid that appeals to you (gatorade, water, juice)  Continue to drink at least eight 8 oz glasses of water throughout the day  You can repeat with another 8 packets of miralax in 64 oz of gatorade if you are not having a large amount of stools  You will need to be at home and close to a bathroom for about 8 hours when you do the above as you may need to go to the bathroom frequently.   If that does not work, you can try the enema  If the enema does not work, come back to the clinic.  After you are cleaned out: - Start Miralax once daily - if you are having diarrhea you can reduce Miralax

## 2016-09-28 NOTE — Assessment & Plan Note (Signed)
Abdominal pain likely due to constipation. Abdominal exam reveals no peritoneal signs and no signs of acute abdomen. -Discussed MiraLAX cleanout with patient in detail.  -Advised to use a daily MiraLAX afterwards.  -Discussed that if she still has not had a bowel movement after using MiraLAX and to use an enema. -If she has still not had a bowel movement after an enema return to clinic next -paper prescriptions for MiraLAX and enema provided

## 2016-12-15 ENCOUNTER — Ambulatory Visit (INDEPENDENT_AMBULATORY_CARE_PROVIDER_SITE_OTHER): Payer: BLUE CROSS/BLUE SHIELD | Admitting: Family Medicine

## 2016-12-15 ENCOUNTER — Encounter: Payer: Self-pay | Admitting: Family Medicine

## 2016-12-15 VITALS — BP 118/60 | HR 68 | Temp 98.1°F | Ht 64.0 in | Wt 156.0 lb

## 2016-12-15 DIAGNOSIS — R519 Headache, unspecified: Secondary | ICD-10-CM

## 2016-12-15 DIAGNOSIS — R51 Headache: Secondary | ICD-10-CM

## 2016-12-15 DIAGNOSIS — G4489 Other headache syndrome: Secondary | ICD-10-CM

## 2016-12-15 HISTORY — DX: Headache, unspecified: R51.9

## 2016-12-15 MED ORDER — KETOROLAC TROMETHAMINE 30 MG/ML IJ SOLN
30.0000 mg | Freq: Once | INTRAMUSCULAR | Status: AC
  Administered 2016-12-15: 30 mg via INTRAMUSCULAR

## 2016-12-15 MED ORDER — PROMETHAZINE HCL 25 MG PO TABS
25.0000 mg | ORAL_TABLET | Freq: Once | ORAL | Status: AC
Start: 2016-12-15 — End: 2016-12-15
  Administered 2016-12-15: 25 mg via ORAL

## 2016-12-15 MED ORDER — IBUPROFEN 400 MG PO TABS: 400.0000 mg | ORAL_TABLET | Freq: Three times a day (TID) | ORAL | 0 refills | Status: DC | PRN

## 2016-12-15 NOTE — Patient Instructions (Signed)
Cluster Headache Cluster headaches are deeply painful. They normally occur on one side of your head, but they may switch sides. Often, cluster headaches:  Are severe.  Happen often for a few weeks or months and then go away for a while.  Last from 15 minutes to 3 hours.  Happen at the same time each day.  Happen at night.  Happen many times a day.  Follow these instructions at home: Follow instructions from your doctor to care for yourself at home:  Go to bed at the same time each night. Get the same amount of sleep every night.  Avoid alcohol.  Stop smoking if you smoke. This includes cigarettes and e-cigarettes.  Take over-the-counter and prescription medicines only as told by your doctor.  Do not drive or use heavy machinery while taking prescription pain medicine.  Use oxygen as told by your doctor.  Exercise regularly.  Eat a healthy diet.  Write down when each headache happened, what kind of pain you had, how bad your pain was, and what you tried to help your pain. This is called a headache diary. Use it as told by your doctor.  Contact a doctor if:  Your headaches get worse or they happen more often.  Your medicines are not helping. Get help right away if:  You pass out (faint).  You get weak or lose feeling (have numbness) on one side of your body or face.  You see two of everything (double vision).  You feel sick to your stomach (nauseous) or you throw up (vomit), and you do not stop after many hours.  You have trouble with your balance or with walking.  You have trouble talking.  You have neck pain or stiffness.  You have a fever. This information is not intended to replace advice given to you by your health care provider. Make sure you discuss any questions you have with your health care provider. Document Released: 08/13/2004 Document Revised: 03/13/2016 Document Reviewed: 03/13/2016 Elsevier Interactive Patient Education  2017 Elsevier  Inc.  

## 2016-12-15 NOTE — Progress Notes (Signed)
Subjective:     Patient ID: Deborah Sampson, female   DOB: 01-02-1961, 56 y.o.   MRN: [id number]  Headache   This is a new problem. The current episode started in the past 7 days (Started 3-4 days ago). The problem occurs intermittently. The problem has been waxing and waning. The pain is located in the bilateral, frontal, parietal and temporal region. Radiates to: back of her neck. The pain quality is similar to prior headaches (She has had headache in the past but this is worse. She has recurrent headache at baseline but worsened recently). The quality of the pain is described as sharp and aching. The pain is at a severity of 8/10. The pain is moderate. Associated symptoms include neck pain. Pertinent negatives include no anorexia, blurred vision, dizziness, eye pain, fever, hearing loss, loss of balance, phonophobia, photophobia, seizures, visual change, vomiting or weakness. Associated symptoms comments: Feels heaviness in her head. Feels tired, also feels weak inher eyes.. Nothing aggravates the symptoms. She has tried nothing for the symptoms. The treatment provided no relief.   Current Outpatient Prescriptions on File Prior to Visit  Medication Sig Dispense Refill  . acetaminophen (TYLENOL) 500 MG tablet Take 1 tablet (500 mg total) by mouth every 6 (six) hours as needed. 30 tablet 0  . docusate sodium (COLACE) 100 MG capsule Take 1 capsule (100 mg total) by mouth 2 (two) times daily. 60 capsule 2  . polyethylene glycol (MIRALAX / GLYCOLAX) packet Take 17 g by mouth daily. 100 each 0   No current facility-administered medications on file prior to visit.    No past medical history on file.   Review of Systems  Constitutional: Negative for fever.  HENT: Negative for hearing loss.   Eyes: Negative for blurred vision, photophobia and pain.  Respiratory: Negative.   Cardiovascular: Negative.   Gastrointestinal: Negative.  Negative for anorexia and vomiting.  Musculoskeletal: Positive for neck  pain.  Neurological: Positive for headaches. Negative for dizziness, seizures, weakness and loss of balance.  All other systems reviewed and are negative.  Vitals:   12/15/16 1057  BP: 118/60  Pulse: 68  Temp: 98.1 F (36.7 C)  TempSrc: Oral  SpO2: 98%  Weight: 156 lb (70.8 kg)  Height: 5\' 4"  (1.626 m)       Objective:   Physical Exam  Constitutional: She is oriented to person, place, and time. She appears well-developed. No distress.  HENT:  Head: Normocephalic and atraumatic.  Right Ear: Tympanic membrane, external ear and ear canal normal.  Left Ear: Tympanic membrane, external ear and ear canal normal.  Mouth/Throat: Oropharynx is clear and moist.  Eyes: Conjunctivae, EOM and lids are normal. Pupils are equal, round, and reactive to light. Right pupil is reactive. Left pupil is reactive.  Fundoscopic exam:      The right eye shows no papilledema.       The left eye shows no papilledema.    Neck: Neck supple. No neck rigidity. No Brudzinski's sign and no Kernig's sign noted. No thyroid mass present.  Cardiovascular: Normal rate, regular rhythm and normal heart sounds.   No murmur heard. Pulmonary/Chest: Effort normal and breath sounds normal. No respiratory distress. She has no wheezes.  Abdominal: Bowel sounds are normal. She exhibits no mass. There is no tenderness.  Musculoskeletal: Normal range of motion. She exhibits no edema.  Neurological: She is alert and oriented to person, place, and time. She has normal strength and normal reflexes. She displays no tremor. No cranial  nerve deficit or sensory deficit. She displays no Babinski's sign on the right side. She displays no Babinski's sign on the left side.  Nursing note and vitals reviewed.      Assessment:     Headache     Plan:     Check problem list   More than 50% of the 45 min face to face encounter was spent on counseling and coordination of care.

## 2016-12-15 NOTE — Assessment & Plan Note (Addendum)
Old record reviewed. Seems to have had recurrent headache in the past. Lightly migraine vs stress induced headache. No red flags. No neurologic deficit on physical exam. No signs of meningeal irritation. Phenergan 25 mg oral and Toradol 30 mg IM given during today's visit.  She was monitored for few min and she endorsed some improvement of her symptoms. Indication for ED visit discussed. F/U with PCP recommended to reevaluate and consider prophylactic treatment for her headache vs need for imaging. In the mean time use Ibuprofen as needed for headache. She verbalized understanding. Interpreter used.

## 2016-12-21 ENCOUNTER — Ambulatory Visit (HOSPITAL_COMMUNITY)
Admission: EM | Admit: 2016-12-21 | Discharge: 2016-12-21 | Disposition: A | Discharge status: Home / Self Care | Payer: BLUE CROSS/BLUE SHIELD | Attending: Emergency Medicine | Admitting: Emergency Medicine

## 2016-12-21 ENCOUNTER — Encounter (HOSPITAL_COMMUNITY): Payer: Self-pay | Admitting: Emergency Medicine

## 2016-12-21 DIAGNOSIS — R1031 Right lower quadrant pain: Secondary | ICD-10-CM | POA: Diagnosis present

## 2016-12-21 DIAGNOSIS — D259 Leiomyoma of uterus, unspecified: Secondary | ICD-10-CM | POA: Diagnosis not present

## 2016-12-21 DIAGNOSIS — R1030 Lower abdominal pain, unspecified: Secondary | ICD-10-CM | POA: Diagnosis not present

## 2016-12-21 LAB — POCT URINALYSIS DIP (DEVICE)
BILIRUBIN URINE: NEGATIVE
Glucose, UA: NEGATIVE mg/dL
HGB URINE DIPSTICK: NEGATIVE
Leukocytes, UA: NEGATIVE
Nitrite: NEGATIVE
PH: 6 (ref 5.0–8.0)
Protein, ur: NEGATIVE mg/dL
SPECIFIC GRAVITY, URINE: 1.02 (ref 1.005–1.030)
Urobilinogen, UA: 0.2 mg/dL (ref 0.0–1.0)

## 2016-12-21 LAB — COMPREHENSIVE METABOLIC PANEL
ALBUMIN: 3.8 g/dL (ref 3.5–5.0)
ALT: 17 U/L (ref 14–54)
AST: 26 U/L (ref 15–41)
Alkaline Phosphatase: 54 U/L (ref 38–126)
Anion gap: 10 (ref 5–15)
BILIRUBIN TOTAL: 0.8 mg/dL (ref 0.3–1.2)
BUN: 10 mg/dL (ref 6–20)
CO2: 24 mmol/L (ref 22–32)
Calcium: 8.9 mg/dL (ref 8.9–10.3)
Chloride: 100 mmol/L — ABNORMAL LOW (ref 101–111)
Creatinine, Ser: 0.74 mg/dL (ref 0.44–1.00)
GFR calc Af Amer: >60 mL/min (ref >60)
GFR calc non Af Amer: >60 mL/min (ref >60)
GLUCOSE: 83 mg/dL (ref 65–99)
POTASSIUM: 4 mmol/L (ref 3.5–5.1)
Sodium: 134 mmol/L — ABNORMAL LOW (ref 135–145)
TOTAL PROTEIN: 7.1 g/dL (ref 6.5–8.1)

## 2016-12-21 LAB — CBC WITH DIFFERENTIAL/PLATELET
BASOS ABS: 0 10*3/uL (ref 0.0–0.1)
BASOS PCT: 0 %
Eosinophils Absolute: 0 10*3/uL (ref 0.0–0.7)
Eosinophils Relative: 1 %
HEMATOCRIT: 40.6 % (ref 36.0–46.0)
HEMOGLOBIN: 13.7 g/dL (ref 12.0–15.0)
Lymphocytes Relative: 32 %
Lymphs Abs: 2.1 10*3/uL (ref 0.7–4.0)
MCH: 29 pg (ref 26.0–34.0)
MCHC: 33.7 g/dL (ref 30.0–36.0)
MCV: 85.8 fL (ref 78.0–100.0)
Monocytes Absolute: 0.4 10*3/uL (ref 0.1–1.0)
Monocytes Relative: 6 %
NEUTROS ABS: 4.1 10*3/uL (ref 1.7–7.7)
NEUTROS PCT: 61 %
Platelets: 300 10*3/uL (ref 150–400)
RBC: 4.73 MIL/uL (ref 3.87–5.11)
RDW: 12.5 % (ref 11.5–15.5)
WBC: 6.6 10*3/uL (ref 4.0–10.5)

## 2016-12-21 LAB — POCT PREGNANCY, URINE: PREG TEST UR: NEGATIVE

## 2016-12-21 NOTE — ED Triage Notes (Signed)
The patient presented to the [facility] with a complaint of abdominal pain and a headache x 2 weeks.

## 2016-12-21 NOTE — ED Triage Notes (Signed)
Pt c/o lower abd pain and headache onset earlier today.  Pt denies any nausea or vomiting

## 2016-12-21 NOTE — ED Provider Notes (Signed)
CSN: [id number]     Arrival date & time 12/21/16  1631 History   First MD Initiated Contact with Patient 12/21/16 1956     Chief Complaint  Patient presents with  . Abdominal Pain  . Headache   (Consider location/radiation/quality/duration/timing/severity/associated sxs/prior Treatment) 56 year old female presents with lower abdominal pain that has been occurring for the past 2 months but much worse today. Pain is radiating to her back. Also experiencing headache but no fever, URI symptoms, nausea, vomiting or diarrhea. Her last period was 3 months ago and uncertain if pain is related to GYN issue. Was seen at her PCP about 1 week ago for headache. Was given Toradol and Phenergan with no relief. No distinct mention of abdominal pain in the notes from her PCP. Denies any distinct pain when urinating. No unusual vaginal discharge. Has not taken any medication for symptoms today. Using an audio interpreter but having a poor connection so very difficult to obtain an accurate history.    The history is limited by a language barrier. A language interpreter was used (Very difficult to understand interpreter- connection kept breaking up so limited information obtained).    History reviewed. No pertinent past medical history. History reviewed. No pertinent surgical history. History reviewed. No pertinent family history. Social History  Substance Use Topics  . Smoking status: Never Smoker  . Smokeless tobacco: Never Used  . Alcohol use No   OB History    No data available     Review of Systems  Constitutional: Positive for appetite change and fatigue. Negative for chills and fever.  HENT: Negative for congestion, ear pain, sinus pain, sinus pressure, sore throat and trouble swallowing.   Eyes: Negative for photophobia and visual disturbance.  Respiratory: Negative for cough, chest tightness, shortness of breath and wheezing.   Cardiovascular: Negative for chest pain.  Gastrointestinal:  Positive for abdominal pain. Negative for blood in stool, diarrhea, nausea and vomiting.  Genitourinary: Positive for pelvic pain. Negative for difficulty urinating, dysuria, flank pain, hematuria, vaginal bleeding and vaginal discharge.  Musculoskeletal: Positive for back pain.  Skin: Negative for rash and wound.  Neurological: Positive for weakness and headaches. Negative for dizziness, syncope and light-headedness.  Hematological: Negative for adenopathy.    Allergies  Penicillins  Home Medications   Prior to Admission medications   Medication Sig Start Date End Date Taking? Authorizing Provider  acetaminophen (TYLENOL) 500 MG tablet Take 1 tablet (500 mg total) by mouth every 6 (six) hours as needed. 11/27/15   Alveda Reasons, MD  docusate sodium (COLACE) 100 MG capsule Take 1 capsule (100 mg total) by mouth 2 (two) times daily. 01/24/16   Alveda Reasons, MD  ibuprofen (ADVIL,MOTRIN) 400 MG tablet Take 1 tablet (400 mg total) by mouth every 8 (eight) hours as needed for headache. 12/15/16   Kinnie Feil, MD  polyethylene glycol (MIRALAX / GLYCOLAX) packet Take 17 g by mouth daily. 09/28/16   Carlyle Dolly, MD   Meds Ordered and Administered this Visit  Medications - No data to display  BP 125/66 (BP Location: Left Arm)   Pulse 73   Temp 98.3 F (36.8 C) (Oral)   Resp 16   SpO2 99%  No data found.   Physical Exam  Constitutional: She appears well-developed and well-nourished. She appears ill. She appears distressed.  Sitting in wheelchair and needs help changing positions due to intense abdominal pain. Is doubled over in pain when standing.   HENT:  Head:  Normocephalic and atraumatic.  Right Ear: Hearing, tympanic membrane, external ear and ear canal normal.  Left Ear: Hearing, tympanic membrane, external ear and ear canal normal.  Nose: Nose normal.  Mouth/Throat: Uvula is midline, oropharynx is clear and moist and mucous membranes are normal.  Eyes:  Conjunctivae and EOM are normal.  Neck: Normal range of motion. Neck supple.  Cardiovascular: Normal rate, regular rhythm and normal heart sounds.   No murmur heard. Pulmonary/Chest: Effort normal and breath sounds normal. No respiratory distress. She has no wheezes. She has no rales.  Abdominal: Soft. Normal appearance and bowel sounds are normal. There is no hepatosplenomegaly. There is generalized tenderness and tenderness in the right lower quadrant and left lower quadrant. There is guarding. There is no rigidity, no rebound, no CVA tenderness and negative Murphy's sign.  Musculoskeletal: Normal range of motion.  Lymphadenopathy:    She has no cervical adenopathy.  Neurological: She is alert.  Skin: Skin is warm and dry.    Urgent Care Course     Procedures (including critical care time)  Labs Review Labs Reviewed  POCT URINALYSIS DIP (DEVICE) - Abnormal; Notable for the following:       Result Value   Ketones, ur TRACE (*)    All other components within normal limits  POCT PREGNANCY, URINE    Imaging Review No results found.   Visual Acuity Review  Right Eye Distance:   Left Eye Distance:   Bilateral Distance:    Right Eye Near:   Left Eye Near:    Bilateral Near:         MDM   1. Lower abdominal pain    Reviewed urinalysis results with patient through interpreter. Due to intense abdominal pain (patient doubled over in pain) and uncertain of cause, recommend patient go to ER now for further evaluation. Patient understands and is transported by shuttle to the ER.     Katy Apo, NP 12/22/16 [phone]

## 2016-12-21 NOTE — ED Notes (Signed)
Family member translated for intake.

## 2016-12-22 ENCOUNTER — Emergency Department (HOSPITAL_COMMUNITY)
Admission: EM | Admit: 2016-12-22 | Discharge: 2016-12-22 | Disposition: A | Discharge status: Home / Self Care | Payer: BLUE CROSS/BLUE SHIELD | Attending: Emergency Medicine | Admitting: Emergency Medicine

## 2016-12-22 ENCOUNTER — Emergency Department (HOSPITAL_COMMUNITY): Payer: BLUE CROSS/BLUE SHIELD

## 2016-12-22 DIAGNOSIS — R1084 Generalized abdominal pain: Secondary | ICD-10-CM

## 2016-12-22 DIAGNOSIS — D259 Leiomyoma of uterus, unspecified: Secondary | ICD-10-CM

## 2016-12-22 MED ORDER — IOPAMIDOL (ISOVUE-300) INJECTION 61%
100.0000 mL | Freq: Once | INTRAVENOUS | Status: AC | PRN
  Administered 2016-12-22: 100 mL via INTRAVENOUS

## 2016-12-22 MED ORDER — ONDANSETRON HCL 4 MG/2ML IJ SOLN
4.0000 mg | Freq: Once | INTRAMUSCULAR | Status: AC
  Administered 2016-12-22: 4 mg via INTRAVENOUS
  Filled 2016-12-22: qty 2

## 2016-12-22 MED ORDER — HYDROCODONE-ACETAMINOPHEN 5-325 MG PO TABS: 1.0000 | ORAL_TABLET | ORAL | 0 refills | Status: DC | PRN

## 2016-12-22 MED ORDER — SODIUM CHLORIDE 0.9 % IV SOLN
Freq: Once | INTRAVENOUS | Status: AC
  Administered 2016-12-22: 04:00:00 via INTRAVENOUS

## 2016-12-22 MED ORDER — MORPHINE SULFATE (PF) 4 MG/ML IV SOLN
4.0000 mg | Freq: Once | INTRAVENOUS | Status: AC
  Administered 2016-12-22: 4 mg via INTRAVENOUS
  Filled 2016-12-22: qty 1

## 2016-12-22 NOTE — ED Notes (Signed)
Patient transported to CT 

## 2016-12-22 NOTE — Discharge Instructions (Signed)
Your CT cans shows you have Fibroids in your uterus you will need to see a gynecologist for further evaluation

## 2016-12-22 NOTE — ED Provider Notes (Signed)
[facility] DEPT Provider Note   CSN: [id number] Arrival date & time: 12/21/16  2125     History   Chief Complaint Chief Complaint  Patient presents with  . Abdominal Pain  . Headache    HPI Deborah Sampson is a 56 y.o. female.  56 year old female who presents to the emergency vomiting tonight with worsening abdominal pain.  She states that the pain started approximately a month ago, not associated with nausea, vomiting, diarrhea, constipation.  Denies any vaginal discharge.  She is concerned she has not had a menstrual cycle in 3 months, although she is 56 years old. She was seen at urgent care and due to the acuity and intensity of her pain was sent to the emergency room for further evaluation. With the help of an interpreter-  She states that she's never had pain like this before.  She has no significant GYN history.  Denies any vaginal discharge or dysuria. She describes the pain as cramping and intense starting in the left and right lower quadrant and radiating to the back bilaterally      History reviewed. No pertinent past medical history.  Patient Active Problem List   Diagnosis Date Noted  . Headache 12/15/2016  . Constipation 01/24/2016  . Refugee health examination 11/27/2015  . Back pain 11/27/2015  . Abdominal pain 11/27/2015  . Palpitations 11/27/2015  . Scars 11/27/2015    History reviewed. No pertinent surgical history.  OB History    No data available       Home Medications    Prior to Admission medications   Medication Sig Start Date End Date Taking? Authorizing Provider  ibuprofen (ADVIL,MOTRIN) 400 MG tablet Take 1 tablet (400 mg total) by mouth every 8 (eight) hours as needed for headache. 12/15/16  Yes Kinnie Feil, MD  acetaminophen (TYLENOL) 500 MG tablet Take 1 tablet (500 mg total) by mouth every 6 (six) hours as needed. Patient not taking: Reported on 12/22/2016 11/27/15   Alveda Reasons, MD  docusate sodium (COLACE) 100 MG  capsule Take 1 capsule (100 mg total) by mouth 2 (two) times daily. Patient not taking: Reported on 12/22/2016 01/24/16   Alveda Reasons, MD  HYDROcodone-acetaminophen (NORCO/VICODIN) 5-325 MG tablet Take 1 tablet by mouth every 4 (four) hours as needed. 12/22/16   Junius Creamer, NP  polyethylene glycol [facility] / Floria Raveling) packet Take 17 g by mouth daily. Patient not taking: Reported on 12/22/2016 09/28/16   Carlyle Dolly, MD    Family History No family history on file.  Social History Social History  Substance Use Topics  . Smoking status: Never Smoker  . Smokeless tobacco: Never Used  . Alcohol use No     Allergies   Penicillins   Review of Systems Review of Systems  Constitutional: Negative for fever.  Respiratory: Negative for shortness of breath.   Cardiovascular: Negative for chest pain.  Gastrointestinal: Positive for abdominal pain. Negative for abdominal distention, constipation, diarrhea, nausea, rectal pain and vomiting.  Genitourinary: Negative for dysuria and frequency.  Skin: Negative for rash and wound.  All other systems reviewed and are negative.    Physical Exam Updated Vital Signs BP 123/79 (BP Location: Right Arm)   Pulse 65   Temp 98.2 F (36.8 C) (Oral)   Resp 16   Ht 5\' 4"  (1.626 m)   SpO2 99%   Physical Exam  Constitutional: She appears well-developed and well-nourished.  HENT:  Head: Normocephalic.  Right Ear: External ear normal.  Left  Ear: External ear normal.  Eyes: Pupils are equal, round, and reactive to light.  Neck: Normal range of motion.  Cardiovascular: Normal rate and regular rhythm.   Pulmonary/Chest: Effort normal and breath sounds normal.  Abdominal: Soft. Bowel sounds are normal. She exhibits no distension. There is tenderness. There is no guarding.  Musculoskeletal: Normal range of motion.  Neurological: She is alert.  Skin: Skin is warm.  Psychiatric: She has a normal mood and affect.  Nursing note and vitals  reviewed.    ED Treatments / Results  Labs (all labs ordered are listed, but only abnormal results are displayed) Labs Reviewed  COMPREHENSIVE METABOLIC PANEL - Abnormal; Notable for the following:       Result Value   Sodium 134 (*)    Chloride 100 (*)    All other components within normal limits  CBC WITH DIFFERENTIAL/PLATELET    EKG  EKG Interpretation None       Radiology Ct Abdomen Pelvis W Contrast  Result Date: 12/22/2016 CLINICAL DATA:  Abdominal pain. EXAM: CT ABDOMEN AND PELVIS WITH CONTRAST TECHNIQUE: Multidetector CT imaging of the abdomen and pelvis was performed using the standard protocol following bolus administration of intravenous contrast. CONTRAST:  133mL ISOVUE-300 IOPAMIDOL (ISOVUE-300) INJECTION 61% COMPARISON:  None. FINDINGS: Lower chest: Atelectasis or scarring at the lung bases, left greater than right. Hepatobiliary: No focal liver abnormality is seen. No gallstones, gallbladder wall thickening, or biliary dilatation. Pancreas: No ductal dilatation or inflammation. Spleen: Normal in size without focal abnormality. Adrenals/Urinary Tract: No adrenal nodule. No hydronephrosis or perinephric edema. Small subcentimeter low-density lesions within both kidneys are too small to characterize, likely cysts. Urinary bladder is nondistended, possible bladder wall thickening. Stomach/Bowel: The stomach is decompressed. No evidence of bowel wall thickening, distention or inflammation. Small volume of colonic stool. Minimal high density within the proximal appendix may be an appendicolith, however the appendix is normal in caliber without periappendiceal inflammation or evidence of appendicitis. Vascular/Lymphatic: No significant vascular findings are present. No enlarged abdominal or pelvic lymph nodes. Reproductive: The uterus is bulbous, with probable fibroids. No adnexal mass. Other: No free air, free fluid, or intra-abdominal fluid collection. 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 There are no acute or suspicious osseous abnormalities. IMPRESSION: 1. Possible urinary bladder wall thickening, may simply be secondary to nondistention, cannot exclude cystitis. Otherwise no acute abnormality. 2. Probable uterine fibroids. 3. Possible appendicolith at the base of otherwise normal appendix. No evidence of appendicitis or periappendiceal inflammation. Electronically Signed   By: Jeb Levering M.D.   On: 12/22/2016 05:09    Procedures Procedures (including critical care time)  Medications Ordered in ED Medications  0.9 %  sodium chloride infusion ( Intravenous New Bag/Given 12/22/16 0357)  morphine 4 MG/ML injection 4 mg (4 mg Intravenous Given 12/22/16 0359)  ondansetron (ZOFRAN) injection 4 mg (4 mg Intravenous Given 12/22/16 0358)  iopamidol (ISOVUE-300) 61 % injection 100 mL (100 mLs Intravenous Contrast Given 12/22/16 0425)     Initial Impression / Assessment and Plan / ED Course  I have reviewed the triage vital signs and the nursing notes.  Pertinent labs & imaging results that were available during my care of the patient were reviewed by me and considered in my medical decision making (see chart for details).      CT scan shows that the patient has numerous small fibroids within the uterus, although other abdominal organs within normal parameters.  She'll be discharged home  with a prescription for Norco and GYN follow-up  Final Clinical Impressions(s) / ED Diagnoses   Final diagnoses:  Uterine leiomyoma, unspecified location  Generalized abdominal pain    New Prescriptions New Prescriptions   HYDROCODONE-ACETAMINOPHEN (NORCO/VICODIN) 5-325 MG TABLET    Take 1 tablet by mouth every 4 (four) hours as needed.     Junius Creamer, NP 12/22/16 [id number]    Ripley Fraise, MD 12/22/16 [phone]

## 2016-12-29 ENCOUNTER — Telehealth: Payer: Self-pay | Admitting: Family Medicine

## 2016-12-29 NOTE — Telephone Encounter (Signed)
Clinical info completed on Return to work form.  Place form in [place] box for completion.  Katharina Caper, April D, [place]

## 2016-12-29 NOTE — Telephone Encounter (Signed)
Return to work form dropped off for at front desk for completion.  Verified that patient section of form has been completed.  Last DOS with PCP was 01/24/16.  Placed form in team folder to be completed by clinical staff.  Crista Luria

## 2016-12-29 NOTE — Telephone Encounter (Signed)
Will complete when I'm back in the office on Friday.

## 2017-01-01 NOTE — Telephone Encounter (Signed)
Patient came into clinic today.  I was able to complete the form for her and give it to her.  She simply needed a form from her work stating she was cleared to go back to work.  She feels well and wants to go back to work.

## 2017-03-15 ENCOUNTER — Ambulatory Visit (INDEPENDENT_AMBULATORY_CARE_PROVIDER_SITE_OTHER): Payer: BLUE CROSS/BLUE SHIELD | Admitting: Family Medicine

## 2017-03-15 DIAGNOSIS — L819 Disorder of pigmentation, unspecified: Secondary | ICD-10-CM | POA: Insufficient documentation

## 2017-03-15 MED ORDER — TRIAMCINOLONE ACETONIDE 0.1 % EX CREA: 1.0000 "application " | TOPICAL_CREAM | Freq: Two times a day (BID) | CUTANEOUS | 0 refills | Status: DC

## 2017-03-15 NOTE — Progress Notes (Signed)
    Subjective:  Deborah Sampson is a 56 y.o. female who presents to the [facility] today with a chief complaint of skin changes on legs  HPI:  57yo F recently moved to [place] from [place] who is here today for same day visit due to some skin changes she has noticed on her bilateral legs.  There are hypopigmented lesions scattered on her bilateral legs from her knees down to the anterior surfaces of her feet. They're not painful, scaly, red, warm or itchy. She denies any numbness or tingling in her legs.  She started having these changes about 1 year ago and feels that they're now getting bigger.  We do not have any pictures on file, unfortunately.  No one else in her family has any skin lesions like this.   Tobacco use reviewed Medication: reviewed and updated ROS: see HPI   Objective:  Physical Exam: There were no vitals taken for this visit.  Gen: 57 year old female in NAD, resting comfortably Skin: warm, dry, scattered hypopigmented lesions that are well circumscribed from the bilateral knees down to the anterior surfaces of the feet and are circumferentially  No results found for this or any previous visit (from the past 72 hour(s)).        Assessment/Plan:  Hypopigmentation Multiple scattered hypopigmented lesions on the lower extremities that are nonpalpable non-scaly, nonpainful and non-itchy and has no changes in sensation.  Unlikely to be leprosy. 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  - We will try Kenalog 0.1% 6 weeks - Follow patient in 2 weeks if no progress, will get biopsy

## 2017-03-15 NOTE — Assessment & Plan Note (Signed)
Multiple scattered hypopigmented lesions on the lower extremities that are nonpalpable non-scaly, nonpainful and non-itchy and has no changes in sensation.  Unlikely to be leprosy. 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  - We will try Kenalog 0.1% 6 weeks - Follow patient in 2 weeks if no progress, will get biopsy

## 2017-03-15 NOTE — Patient Instructions (Signed)
Deborah Sampson, you were seen today for skin changes on your legs.  I have prescribed you a cream that you can apply two times a day and I will see you back in the office in two weeks an we can see if it improves.   It was very nice meeting you today, Deborah Sampson. Rosalyn Gess, [place] Resident PGY-2 03/15/2017 12:11 PM

## 2017-04-09 ENCOUNTER — Ambulatory Visit: Payer: BLUE CROSS/BLUE SHIELD | Admitting: Family Medicine

## 2017-04-23 ENCOUNTER — Ambulatory Visit (HOSPITAL_COMMUNITY)
Admission: RE | Admit: 2017-04-23 | Discharge: 2017-04-23 | Disposition: A | Discharge status: Home / Self Care | Payer: BLUE CROSS/BLUE SHIELD | Source: Ambulatory Visit | Attending: Family Medicine | Admitting: Family Medicine

## 2017-04-23 ENCOUNTER — Ambulatory Visit (INDEPENDENT_AMBULATORY_CARE_PROVIDER_SITE_OTHER): Payer: BLUE CROSS/BLUE SHIELD | Admitting: Family Medicine

## 2017-04-23 ENCOUNTER — Encounter: Payer: Self-pay | Admitting: Family Medicine

## 2017-04-23 VITALS — BP 118/66 | HR 67 | Temp 97.9°F | Ht 64.0 in | Wt 161.0 lb

## 2017-04-23 DIAGNOSIS — R002 Palpitations: Secondary | ICD-10-CM

## 2017-04-23 DIAGNOSIS — L819 Disorder of pigmentation, unspecified: Secondary | ICD-10-CM

## 2017-04-23 MED ORDER — TRIAMCINOLONE ACETONIDE 0.1 % EX CREA: 1.0000 "application " | TOPICAL_CREAM | Freq: Two times a day (BID) | CUTANEOUS | 2 refills | Status: DC

## 2017-04-23 NOTE — Assessment & Plan Note (Signed)
Possibly vitiligo though not a clear presentation. No other hypopigmented spots noted. She did have some relief of her symptoms and return of normal pigment with triamcinolone. We'll provide her with a refill today.

## 2017-04-23 NOTE — Progress Notes (Signed)
Subjective:   Deborah Sampson interpreter [phone] Deborah Sampson used for entire visit  Deborah Sampson is a 56 y.o. female who presents to [facility] today for hypopigmentation on legs:  1.  Hypopigmentation:  Seen here about a month ago for the same.  Present while in [place].  However over the past 1.5 years these have been worsening.  Prescribed Triamcinolone at that time with Relief but she ran out of this and has not been able to secure a refill. No pain. No itching.  2.  Palpitations:  Patient states this is been ongoing for the past several months or maybe longer but she just has not brought this up at her previous visit. Can happen once or twice a week. Last for a few seconds and then resolves. Occasionally become short of breath but happens. No lightheadedness. No syncope or presyncope symptoms.  No chest pain on rest or exertion. No recent illnesses.  3.  Family history of diabetes:  Would like her blood sugar checked.  No polyuria/polydispia.     ROS as above per HPI.    The following portions of the patient's history were reviewed and updated as appropriate: allergies, current medications, past medical history, family and social history, and problem list. Patient is a nonsmoker.    PMH reviewed.  No past medical history on file. No past surgical history on file.  Medications reviewed. Current Outpatient Prescriptions  Medication Sig Dispense Refill  . acetaminophen (TYLENOL) 500 MG tablet Take 1 tablet (500 mg total) by mouth every 6 (six) hours as needed. (Patient not taking: Reported on 12/22/2016) 30 tablet 0  . docusate sodium (COLACE) 100 MG capsule Take 1 capsule (100 mg total) by mouth 2 (two) times daily. (Patient not taking: Reported on 12/22/2016) 60 capsule 2  . HYDROcodone-acetaminophen (NORCO/VICODIN) 5-325 MG tablet Take 1 tablet by mouth every 4 (four) hours as needed. 10 tablet 0  . ibuprofen (ADVIL,MOTRIN) 400 MG tablet Take 1 tablet (400 mg total) by mouth every 8 (eight) hours  as needed for headache. 60 tablet 0  . polyethylene glycol (MIRALAX / GLYCOLAX) packet Take 17 g by mouth daily. (Patient not taking: Reported on 12/22/2016) 100 each 0  . triamcinolone cream (KENALOG) 0.1 % Apply 1 application topically 2 (two) times daily. Dispense qs x4 weeks 30 g 0   No current facility-administered medications for this visit.      Objective:   Physical Exam BP 118/66   Pulse 67   Temp 97.9 F (36.6 C) (Oral)   Ht 5\' 4"  (1.626 m)   Wt 161 lb (73 kg)   LMP 03/27/2017   SpO2 97%   BMI 27.64 kg/m  Gen:  Alert, cooperative patient who appears stated age in no acute distress.  Vital signs reviewed. HEENT: EOMI,  MMM Cardiac:  Regular rate and rhythm  Pulm:  Clear to auscultation bilaterally with good air movement.  No wheezes or rales noted.   Skin: Flat, hypopigmented macules scattered circumferentially around bilateral legs and feet. None on hands arms or upper body. No excoriations. No other lesions noted.  No results found for this or any previous visit (from the past 72 hour(s)).

## 2017-04-23 NOTE — Assessment & Plan Note (Signed)
Has had this previously in the past on records review. She has had a low TSH the past. We are rechecking TSH today. EKG showed borderline QTC. Some left atrial enlargement. Otherwise normal EKG. If symptoms persist will send to cardiology for Holter monitor.

## 2017-04-23 NOTE — Patient Instructions (Signed)
It was good to see you today!  Use the cream on your legs.  We will get your family scheduled with [place].    Come back and see me in 1 month. We will talk about your labs at that visit.

## 2017-04-24 LAB — TSH
TSH: 1.23 u[IU]/mL (ref 0.450–4.500)
TSH: 1.28 u[IU]/mL (ref 0.450–4.500)

## 2017-04-24 LAB — BASIC METABOLIC PANEL
BUN / CREAT RATIO: 9 (ref 9–23)
BUN: 7 mg/dL (ref 6–24)
CO2: 24 mmol/L (ref 20–29)
CREATININE: 0.79 mg/dL (ref 0.57–1.00)
Calcium: 9.4 mg/dL (ref 8.7–10.2)
Chloride: 99 mmol/L (ref 96–106)
GFR, EST AFRICAN AMERICAN: 98 mL/min/{1.73_m2} (ref >59)
GFR, EST NON AFRICAN AMERICAN: 85 mL/min/{1.73_m2} (ref >59)
Glucose: 109 mg/dL — ABNORMAL HIGH (ref 65–99)
POTASSIUM: 4.2 mmol/L (ref 3.5–5.2)
SODIUM: 138 mmol/L (ref 134–144)

## 2017-04-24 LAB — CBC
HEMATOCRIT: 38.8 % (ref 34.0–46.6)
HEMOGLOBIN: 13.4 g/dL (ref 11.1–15.9)
MCH: 29.8 pg (ref 26.6–33.0)
MCHC: 34.5 g/dL (ref 31.5–35.7)
MCV: 86 fL (ref 79–97)
Platelets: 300 10*3/uL (ref 150–379)
RBC: 4.49 x10E6/uL (ref 3.77–5.28)
RDW: 12.8 % (ref 12.3–15.4)
WBC: 5.4 10*3/uL (ref 3.4–10.8)

## 2017-04-24 LAB — HEMOGLOBIN A1C
Est. average glucose Bld gHb Est-mCnc: 108 mg/dL
Hgb A1c MFr Bld: 5.4 % (ref 4.8–5.6)

## 2017-04-26 ENCOUNTER — Encounter: Payer: Self-pay | Admitting: Family Medicine

## 2017-11-12 ENCOUNTER — Ambulatory Visit (INDEPENDENT_AMBULATORY_CARE_PROVIDER_SITE_OTHER): Payer: BLUE CROSS/BLUE SHIELD | Admitting: Family Medicine

## 2017-11-12 ENCOUNTER — Other Ambulatory Visit: Payer: Self-pay

## 2017-11-12 VITALS — BP 140/82 | HR 65 | Temp 97.5°F | Ht 64.0 in | Wt 167.2 lb

## 2017-11-12 DIAGNOSIS — K5901 Slow transit constipation: Secondary | ICD-10-CM | POA: Diagnosis not present

## 2017-11-12 MED ORDER — POLYETHYLENE GLYCOL 3350 17 G PO PACK: 17.0000 g | PACK | Freq: Every day | ORAL | 0 refills | Status: DC

## 2017-11-12 MED ORDER — PSYLLIUM 0.52 G PO CAPS: 0.5200 g | ORAL_CAPSULE | Freq: Every day | ORAL | 0 refills | Status: DC

## 2017-11-12 MED ORDER — IBUPROFEN 400 MG PO TABS: 400.0000 mg | ORAL_TABLET | Freq: Three times a day (TID) | ORAL | 0 refills | Status: DC | PRN

## 2017-11-12 NOTE — Patient Instructions (Addendum)
It was great seeing you today! We have addressed the following issues today  1. You are constipated I will prescribed miralax which will help you. If you do not have a bowel movement in the next day or so you should have an ENEMA. 2. I also prescribed a new medication you will take 1-2 caps a day to help you be regular with your bowel movement. It is rich in fiber. 3. Follow up with your regular doctor in 2-3 weeks.  If we did any lab work today, and the results require attention, either me or my nurse will get in touch with you. If everything is normal, you will get a letter in mail and a message via . If you don't hear from [place] in two weeks, please give [place] a call. Otherwise, we look forward to seeing you again at your next visit. If you have any questions or concerns before then, please call the clinic at [phone].  Please bring all your medications to every doctors visit  Sign up for My Chart to have easy access to your labs results, and communication with your Primary care physician. Please ask Front Desk for some assistance.   Please check-out at the front desk before leaving the clinic.    Take Care,   Dr. Andy Gauss

## 2017-11-13 NOTE — Assessment & Plan Note (Signed)
Patient presented with generalized abdominal pain for the past few days.  Denies any nausea and vomiting.  Exam is remarkable for tenderness to palpation in all 4 quadrants worse in the lower quadrant bilaterally.  Mild abdominal distention.  History,  symptoms and physical exam findings are consistent with constipation. No other red flags concerning for abdominal infection, pancreatitis, appendicitis or diverticular. disease. --Prescribed MiraLAX 1-2 Daily in the next few days.  If no improvement in the next 2 days patient will likely need enema.  --Prescribed Metamucil 1 to 2 capsules daily to promote regularity in bowel movement.  --Patient will make an appointment to see PCP in 2 to 3 weeks to follow-up on symptoms.

## 2017-11-13 NOTE — Progress Notes (Signed)
   Subjective:    Patient ID: Deborah Sampson, female    DOB: [birth year], 57 y.o.   MRN: [id number]   CC: Abdominal pain and distention  HPI: Patient is a 57 year old female who presents today complaining of generalized abdominal pain and distention for the past few days.  Patient reports that she has had pain for the past week worsening in the past 2 to 3 days.  She also feels her abdomen is distended.  Patient reports last bowel movement was this evening, however she had very little stool.  Patient has a history of constipation requiring MiraLAX past.  She denies any vomiting, nausea, fever, chills.  Interpreter was used during visit.  Smoking status reviewed   ROS: all other systems were reviewed and are negative other than in the HPI   History reviewed. No pertinent past medical history.  History reviewed. No pertinent surgical history.  Past medical history, surgical, family, and social history reviewed and updated in the EMR as appropriate.  Objective:  BP 140/82   Pulse 65   Temp (!) 97.5 F (36.4 C) (Oral)   Ht 5\' 4"  (1.626 m)   Wt 167 lb 3.2 oz (75.8 kg)   SpO2 97%   BMI 28.70 kg/m   Vitals and nursing note reviewed  General: NAD, pleasant, able to participate in exam Cardiac: RRR, normal heart sounds, no murmurs. 2+ radial and PT pulses bilaterally Respiratory: CTAB, normal effort, No wheezes, rales or rhonchi Abdomen: soft, tender to palpation in all 4 quadrants worse in right left lower quadrant, mildly distended, no hepatic or splenomegaly, +BS Extremities: no edema or cyanosis. WWP. Skin: warm and dry, no rashes noted Neuro: alert and oriented x4, no focal deficits Psych: Normal affect and mood   Assessment & Plan:    Constipation Patient presented with generalized abdominal pain for the past few days.  Denies any nausea and vomiting.  Exam is remarkable for tenderness to palpation in all 4 quadrants worse in the lower quadrant bilaterally.  Mild abdominal  distention.  History,  symptoms and physical exam findings are consistent with constipation. No other red flags concerning for abdominal infection, pancreatitis, appendicitis or diverticular. disease. --Prescribed MiraLAX 1-2 Daily in the next few days.  If no improvement in the next 2 days patient will likely need enema.  --Prescribed Metamucil 1 to 2 capsules daily to promote regularity in bowel movement.  --Patient will make an appointment to see PCP in 2 to 3 weeks to follow-up on symptoms.     Marjie Skiff, MD [place] PGY-2

## 2017-12-07 ENCOUNTER — Ambulatory Visit: Payer: BLUE CROSS/BLUE SHIELD | Admitting: Family Medicine

## 2018-02-23 ENCOUNTER — Ambulatory Visit: Payer: BLUE CROSS/BLUE SHIELD | Admitting: Family Medicine

## 2018-03-21 ENCOUNTER — Ambulatory Visit (INDEPENDENT_AMBULATORY_CARE_PROVIDER_SITE_OTHER)
Admission: EM | Admit: 2018-03-21 | Discharge: 2018-03-21 | Disposition: A | Discharge status: Home / Self Care | Payer: BLUE CROSS/BLUE SHIELD | Source: Home / Self Care

## 2018-03-21 ENCOUNTER — Encounter (HOSPITAL_COMMUNITY): Payer: Self-pay | Admitting: Emergency Medicine

## 2018-03-21 ENCOUNTER — Emergency Department (HOSPITAL_COMMUNITY)
Admission: EM | Admit: 2018-03-21 | Discharge: 2018-03-21 | Disposition: A | Discharge status: Home / Self Care | Payer: BLUE CROSS/BLUE SHIELD | Attending: Emergency Medicine | Admitting: Emergency Medicine

## 2018-03-21 ENCOUNTER — Other Ambulatory Visit: Payer: Self-pay

## 2018-03-21 DIAGNOSIS — G44209 Tension-type headache, unspecified, not intractable: Secondary | ICD-10-CM

## 2018-03-21 DIAGNOSIS — R079 Chest pain, unspecified: Secondary | ICD-10-CM | POA: Diagnosis not present

## 2018-03-21 DIAGNOSIS — R002 Palpitations: Secondary | ICD-10-CM | POA: Diagnosis not present

## 2018-03-21 DIAGNOSIS — R51 Headache: Secondary | ICD-10-CM | POA: Diagnosis present

## 2018-03-21 DIAGNOSIS — Z79899 Other long term (current) drug therapy: Secondary | ICD-10-CM | POA: Diagnosis not present

## 2018-03-21 LAB — BASIC METABOLIC PANEL
Anion gap: 8 (ref 5–15)
BUN: 8 mg/dL (ref 6–20)
CO2: 27 mmol/L (ref 22–32)
CREATININE: 0.89 mg/dL (ref 0.44–1.00)
Calcium: 9.3 mg/dL (ref 8.9–10.3)
Chloride: 104 mmol/L (ref 98–111)
GFR calc Af Amer: >60 mL/min (ref >60)
Glucose, Bld: 126 mg/dL — ABNORMAL HIGH (ref 70–99)
POTASSIUM: 4.4 mmol/L (ref 3.5–5.1)
SODIUM: 139 mmol/L (ref 135–145)

## 2018-03-21 LAB — CBC
HCT: 41.8 % (ref 36.0–46.0)
Hemoglobin: 13.9 g/dL (ref 12.0–15.0)
MCH: 29.4 pg (ref 26.0–34.0)
MCHC: 33.3 g/dL (ref 30.0–36.0)
MCV: 88.6 fL (ref 78.0–100.0)
PLATELETS: 265 10*3/uL (ref 150–400)
RBC: 4.72 MIL/uL (ref 3.87–5.11)
RDW: 11.7 % (ref 11.5–15.5)
WBC: 5.4 10*3/uL (ref 4.0–10.5)

## 2018-03-21 LAB — I-STAT TROPONIN, ED: Troponin i, poc: 0.02 ng/mL (ref 0.00–0.08)

## 2018-03-21 MED ORDER — ASPIRIN 81 MG PO CHEW
324.0000 mg | CHEWABLE_TABLET | Freq: Once | ORAL | Status: AC
  Administered 2018-03-21: 324 mg via ORAL

## 2018-03-21 MED ORDER — NAPROXEN 500 MG PO TABS: 500.0000 mg | ORAL_TABLET | Freq: Two times a day (BID) | ORAL | 0 refills | Status: DC

## 2018-03-21 MED ORDER — ASPIRIN 81 MG PO CHEW
CHEWABLE_TABLET | ORAL | Status: AC
  Filled 2018-03-21: qty 4

## 2018-03-21 NOTE — ED Provider Notes (Signed)
[facility] EMERGENCY DEPARTMENT Provider Note   CSN: [id number] Arrival date & time: 03/21/18  1554     History   Chief Complaint Chief Complaint  Patient presents with  . Headache    HPI Deborah Sampson is a 57 y.o. female with a history of palpitations who presents to the emergency department from UC with a chief complaint of headache.   The patient reports that she awoke this morning with an all-over headache. She describes the headache as feeling like a band around her head.  She reports that she was having some associated photophobia, neck pain, generalized weakness, and feeling like her heart was racing.   She denies fever, chills, numbness, weakness, nausea, vomiting, abdominal pain, chest pain, cough, rash, dizziness, lightheadedness, tinnitus, otalgia, or URI symptoms.  She denies a history of frequent headaches.  She states that she took a pill at home for her symptoms no improvement.  She is unsure of the name of the medication.  She was given for ASA prior to arrival, and reports that her headache as well as her other symptoms have now all resolved.  No recent tick bites.  No recent travel outside of the country.  No recent change in her diet.  She reports that she works very long hours and does not sleep as much as she should.  She reports that she was on her way to work today when she wanted to stop by urgent care and get checked out for her headache prior to going into work.  At urgent care, there was some concern for chest pain and dyspnea so the patient had an EKG performed and was advised to follow-up with the ED for further evaluation.   The history is provided by the patient. The history is limited by a language barrier. A language interpreter was used Stephani Police).    History reviewed. No pertinent past medical history.  Patient Active Problem List   Diagnosis Date Noted  . Hypopigmentation 03/15/2017  . Headache 12/15/2016  .  Constipation 01/24/2016  . Refugee health examination 11/27/2015  . Back pain 11/27/2015  . Palpitations 11/27/2015  . Scars 11/27/2015    History reviewed. No pertinent surgical history.   OB History   None      Home Medications    Prior to Admission medications   Medication Sig Start Date End Date Taking? Authorizing Provider  ibuprofen (ADVIL,MOTRIN) 400 MG tablet Take 1 tablet (400 mg total) by mouth every 8 (eight) hours as needed for headache. 11/12/17   Diallo, Earna Coder, MD  naproxen (NAPROSYN) 500 MG tablet Take 1 tablet (500 mg total) by mouth 2 (two) times daily. 03/21/18   Christina Gintz A, PA-C  polyethylene glycol (MIRALAX / GLYCOLAX) packet Take 17 g by mouth daily. 11/12/17   Diallo, Earna Coder, MD  psyllium (METAMUCIL) 0.52 g capsule Take 1 capsule (0.52 g total) by mouth daily. 11/12/17   Diallo, Earna Coder, MD  triamcinolone cream (KENALOG) 0.1 % Apply 1 application topically 2 (two) times daily. Dispense qs x4 weeks 04/23/17   Alveda Reasons, MD    Family History No family history on file.  Social History Social History   Tobacco Use  . Smoking status: Never Smoker  . Smokeless tobacco: Never Used  Substance Use Topics  . Alcohol use: No  . Drug use: No     Allergies   Penicillins   Review of Systems Review of Systems  Constitutional: Negative for activity change, chills and fever.  HENT: Negative for congestion and sore throat.   Eyes: Positive for photophobia. Negative for pain.  Respiratory: Negative for cough and shortness of breath.   Cardiovascular: Positive for palpitations. Negative for chest pain.  Gastrointestinal: Negative for abdominal pain, diarrhea, nausea and vomiting.  Genitourinary: Negative for dysuria.  Musculoskeletal: Positive for neck pain. Negative for back pain and neck stiffness.  Skin: Negative for rash.  Allergic/Immunologic: Negative for immunocompromised state.  Neurological: Positive for weakness (generalized) and  headaches.  Psychiatric/Behavioral: Negative for confusion.     Physical Exam Updated Vital Signs BP 132/70   Pulse 60   Temp 98.5 F (36.9 C) (Oral)   Resp 13   SpO2 100%   Physical Exam  Constitutional: She is oriented to person, place, and time. No distress.  HENT:  Head: Normocephalic.  Eyes: Pupils are equal, round, and reactive to light. Conjunctivae and EOM are normal. No scleral icterus.  Neck: Neck supple.  No meningismus.  Cardiovascular: Normal rate, regular rhythm, normal heart sounds and intact distal pulses. Exam reveals no gallop and no friction rub.  No murmur heard. Pulmonary/Chest: Effort normal. No stridor. No respiratory distress. She has no wheezes. She has no rales. She exhibits no tenderness.  Abdominal: Soft. She exhibits no distension and no mass. There is no tenderness. There is no rebound and no guarding. No hernia.  Neurological: She is alert and oriented to person, place, and time.  Cranial nerves II through XII are grossly intact.  Sensation is intact and equal throughout the bilateral upper and lower extremity's.  5 out of 5 strength against resistance of the bilateral upper and lower extremities.  Finger-to-nose is intact bilaterally.  No clonus.  Normal gait.   Skin: Skin is warm. No rash noted. She is not diaphoretic.  Psychiatric: Her behavior is normal.  Nursing note and vitals reviewed.  ED Treatments / Results  Labs (all labs ordered are listed, but only abnormal results are displayed) Labs Reviewed  BASIC METABOLIC PANEL - Abnormal; Notable for the following components:      Result Value   Glucose, Bld 126 (*)    All other components within normal limits  CBC  CBG MONITORING, ED  I-STAT TROPONIN, ED    EKG None  Radiology No results found.  Procedures Procedures (including critical care time)  Medications Ordered in ED Medications - No data to display   Initial Impression / Assessment and Plan / ED Course  I have  reviewed the triage vital signs and the nursing notes.  Pertinent labs & imaging results that were available during my care of the patient were reviewed by me and considered in my medical decision making (see chart for details).     57 year old female history of palpitations presenting to the emergency department from urgent care with headache, neck pain, generalized weakness, photophobia, and palpitations, onset this morning. A [place] interpreter through [employer] was used for the entirety of my interactions with this patient.  Upon reviewing her medical record, it appears that she was seen at urgent care on 12/15/2016 with very similar symptoms.  She is also previously been evaluated for palpitations.  In the ED, her headache has resolved after she was given ASA.  She is requesting a work note for the next 2 days so that she can sleep and get more rest.  EKG with normal sinus rhythm.  Troponin is negative.  The patient is adamant that she was not having any chest pain or shortness of breath  along with her symptoms.  Labs are otherwise unremarkable.  On exam, she has no focal neuro deficits.  No meningismus.   At this time, she is asymptomatic, well-appearing, in no acute distress.  She is safe for discharge to home with outpatient follow-up.   Final Clinical Impressions(s) / ED Diagnoses   Final diagnoses:  Acute non intractable tension-type headache  Palpitations    ED Discharge Orders         Ordered    naproxen (NAPROSYN) 500 MG tablet  2 times daily     03/21/18 1937           Heidee Audi, Laymond Purser, PA-C 03/22/18 0111    Dorie Rank, MD 03/23/18 2210

## 2018-03-21 NOTE — ED Triage Notes (Signed)
Ronald interpreter used for translation during triage. Pt reports that today she began having a headache, feeling generally weak, having shortness of breath, back pain, and feeling like her heart was racing. Pt has no neuro deficits. States she took an unknown pill and 4 ASA prior to arrival and that her headache has improved.

## 2018-03-21 NOTE — Discharge Instructions (Addendum)
Thank you for allowing me to care for you today in the Emergency Department.   Take 1 tablet of naproxen 2 times daily for pain or headache.  If you continue to feel symptoms, such as your heart beating out of your chest, please follow-up with your primary care provider, Dr. Mingo Amber.  Return to the emergency department if you develop a high fever, temperature of 100.4 F or higher, with stiffness of the neck so that you are not able to move your neck, vomiting, severe headache, seeing double vision, or other new, concerning symptoms.

## 2018-03-21 NOTE — ED Notes (Signed)
EKG given Dr. Gwendlyn Deutscher, sending patient to the ER

## 2018-03-21 NOTE — ED Notes (Addendum)
Patient Alert and oriented to baseline. Stable and ambulatory to baseline. Language interpreter used for discharge instructions, Patient verbalized understanding of the discharge instructions.  Patient belongings were taken by the patient.

## 2018-03-21 NOTE — ED Notes (Signed)
Interpreter service used for patient assessment.

## 2018-03-21 NOTE — ED Notes (Signed)
Pt presented with heart palpitations and chest pain, seeming SOB EKG was completed Pt discharged to ED per provider

## 2018-03-21 NOTE — ED Notes (Signed)
Attempted UA collection. Patient aware urine sample is needed for UA.

## 2018-06-15 ENCOUNTER — Encounter (HOSPITAL_COMMUNITY): Payer: Self-pay | Admitting: Emergency Medicine

## 2018-06-15 ENCOUNTER — Ambulatory Visit (HOSPITAL_COMMUNITY)
Admission: EM | Admit: 2018-06-15 | Discharge: 2018-06-15 | Disposition: A | Discharge status: Home / Self Care | Payer: BLUE CROSS/BLUE SHIELD | Attending: Family Medicine | Admitting: Family Medicine

## 2018-06-15 DIAGNOSIS — L732 Hidradenitis suppurativa: Secondary | ICD-10-CM | POA: Diagnosis not present

## 2018-06-15 MED ORDER — NAPROXEN 500 MG PO TABS: 500.0000 mg | ORAL_TABLET | Freq: Two times a day (BID) | ORAL | 0 refills | Status: DC

## 2018-06-15 MED ORDER — SULFAMETHOXAZOLE-TRIMETHOPRIM 800-160 MG PO TABS: 1.0000 | ORAL_TABLET | Freq: Two times a day (BID) | ORAL | 0 refills | Status: AC

## 2018-06-15 NOTE — ED Triage Notes (Signed)
Pt here with abscess under right axillary area

## 2018-06-17 NOTE — ED Provider Notes (Signed)
Deborah Sampson   [id number] 06/15/18 Arrival Time: 1112  ASSESSMENT & PLAN:  1. Hidradenitis suppurativa of right axilla   Declines I&D today. Prefers trial of antibiotic.  Meds ordered this encounter  Medications  . naproxen (NAPROSYN) 500 MG tablet    Sig: Take 1 tablet (500 mg total) by mouth 2 (two) times daily.    Dispense:  20 tablet    Refill:  0  . sulfamethoxazole-trimethoprim (BACTRIM DS,SEPTRA DS) 800-160 MG tablet    Sig: Take 1 tablet by mouth 2 (two) times daily for 10 days.    Dispense:  20 tablet    Refill:  0   Follow-up Information    Alveda Reasons, MD.   Specialty:  Family Medicine Why:  As needed. Contact information: [place] [address] [place].   Specialty:  Urgent Care Why:  If you are not seeing improvement with the antibiotic. Contact information: [address] [phone]         Reviewed expectations re: course of current medical issues. Questions answered. Outlined signs and symptoms indicating need for more acute intervention. Patient verbalized understanding. After Visit Summary given.   SUBJECTIVE:  Deborah Sampson is a 57 y.o. female who presents with a skin complaint.   Location: R axilla; no trauma to area Onset: gradual Duration: several days Associated pruritis? none Associated pain? Moderate Progression: increasing steadily  Drainage? No  Known trigger? No  New soaps/lotions/topicals/detergents/environmental exposures? No Contacts with similar? No Recent travel? No  Other associated symptoms: none Therapies tried thus far: none Arthralgia or myalgia? none Recent illness? none Fever? none No specific aggravating or alleviating factors reported.  ROS: As per HPI.  OBJECTIVE: Vitals:   06/15/18 1130  BP: 131/86  Pulse: 71  Resp: 18  Temp: 98.1 F (36.7 C)  TempSrc: Oral   SpO2: 100%    General appearance: alert; no distress Lungs: clear to auscultation bilaterally Heart: regular rate and rhythm Extremities: no edema Skin: warm and dry; approx 2 x 1 cm indurations of her R axilla; tender to touch; no drainage or bleeding; FROM of RUE Psychological: alert and cooperative; normal mood and affect  Allergies  Allergen Reactions  . Penicillins Rash     Social History   Socioeconomic History  . Marital status: Married    Spouse name: Not on file  . Number of children: Not on file  . Years of education: Not on file  . Highest education level: Not on file  Occupational History  . Not on file  Social Needs  . Financial resource strain: Not on file  . Food insecurity:    Worry: Not on file    Inability: Not on file  . Transportation needs:    Medical: Not on file    Non-medical: Not on file  Tobacco Use  . Smoking status: Never Smoker  . Smokeless tobacco: Never Used  Substance and Sexual Activity  . Alcohol use: No  . Drug use: No  . Sexual activity: Not on file  Lifestyle  . Physical activity:    Days per week: Not on file    Minutes per session: Not on file  . Stress: Not on file  Relationships  . Social connections:    Talks on phone: Not on file    Gets together: Not on file    Attends religious service: Not on  file    Active member of club or organization: Not on file    Attends meetings of clubs or organizations: Not on file    Relationship status: Not on file  . Intimate partner violence:    Fear of current or ex partner: Not on file    Emotionally abused: Not on file    Physically abused: Not on file    Forced sexual activity: Not on file  Other Topics Concern  . Not on file  Social History Narrative  . Not on file   History reviewed. No pertinent family history. History reviewed. No pertinent surgical history.   Vanessa Kick, MD 06/17/18 [id number]

## 2018-07-05 ENCOUNTER — Ambulatory Visit: Payer: BLUE CROSS/BLUE SHIELD | Admitting: Family Medicine

## 2018-11-01 ENCOUNTER — Telehealth (INDEPENDENT_AMBULATORY_CARE_PROVIDER_SITE_OTHER): Payer: BLUE CROSS/BLUE SHIELD | Admitting: Family Medicine

## 2018-11-01 ENCOUNTER — Other Ambulatory Visit: Payer: Self-pay

## 2018-11-01 DIAGNOSIS — R519 Headache, unspecified: Secondary | ICD-10-CM

## 2018-11-01 DIAGNOSIS — R51 Headache: Secondary | ICD-10-CM

## 2018-11-01 MED ORDER — NAPROXEN 500 MG PO TABS: 500.0000 mg | ORAL_TABLET | Freq: Two times a day (BID) | ORAL | 1 refills | Status: DC

## 2018-11-01 NOTE — Addendum Note (Signed)
Addended byMingo Amber, Leylah Tarnow H on: 11/01/2018 10:12 AM   Modules accepted: Orders

## 2018-11-01 NOTE — Progress Notes (Signed)
[facility] Telemedicine Visit  Patient consented to have virtual visit. Method of visit: Telephone  Encounter participants: Patient: Deborah Sampson - located at home Provider: Annabell Sabal - located at office Others (if applicable): Interpreter (husband acted as Astronomer at patient request)  Chief Complaint: Headaches  HPI:  Patient has been complaining of headaches on and off for the past 2 weeks.  Not a daily occurrence.  She does not have any medicine at home to take for this.  She said no changes in vision.  No nausea or vomiting.  Dull bilateral aching frontal region of her head.  Typically in the past this is improved with either naproxen or ibuprofen but she does not have either of these at home.  She is continuing to work.  She would like to be out of work today and tomorrow for her headaches.  No fevers or chills.  No recent URI symptoms.  She has no cough.  No confusion.  No changes in mental status.  ROS: per HPI  Pertinent PMHx: Past no history of headaches.  Exam:  Respiratory: Strong voice.  Good work of breathing.  (I was able to hear her while she was talking with the interpreter.)  Assessment/Plan:  1.  Headaches: -She has longstanding history of these in the past. -No red flags. -Question history of migraines.  This is not actually diagnosed. -Plan to refill her naproxen.  It is more likely these are tension style headaches.  Treating him as such.  Red flags and warning precautions provided.  Time spent during visit with patient: 11 minutes

## 2018-11-01 NOTE — Addendum Note (Signed)
Addended byMingo Amber, Kilie Rund H on: 11/01/2018 04:09 PM   Modules accepted: Orders

## 2018-11-02 ENCOUNTER — Ambulatory Visit (HOSPITAL_COMMUNITY)
Admission: EM | Admit: 2018-11-02 | Discharge: 2018-11-02 | Disposition: A | Discharge status: Home / Self Care | Payer: BLUE CROSS/BLUE SHIELD | Attending: Family Medicine | Admitting: Family Medicine

## 2018-11-02 ENCOUNTER — Other Ambulatory Visit: Payer: Self-pay

## 2018-11-02 ENCOUNTER — Encounter (HOSPITAL_COMMUNITY): Payer: Self-pay

## 2018-11-02 DIAGNOSIS — R509 Fever, unspecified: Secondary | ICD-10-CM

## 2018-11-02 DIAGNOSIS — R1011 Right upper quadrant pain: Secondary | ICD-10-CM

## 2018-11-02 DIAGNOSIS — K5900 Constipation, unspecified: Secondary | ICD-10-CM

## 2018-11-02 DIAGNOSIS — R519 Headache, unspecified: Secondary | ICD-10-CM

## 2018-11-02 DIAGNOSIS — R51 Headache: Secondary | ICD-10-CM

## 2018-11-02 MED ORDER — ACETAMINOPHEN 500 MG PO TABS: 500.0000 mg | ORAL_TABLET | Freq: Four times a day (QID) | ORAL | 0 refills | Status: DC | PRN

## 2018-11-02 MED ORDER — FLUTICASONE PROPIONATE 50 MCG/ACT NA SUSP: 2.0000 | Freq: Every day | NASAL | 0 refills | Status: DC

## 2018-11-02 NOTE — Discharge Instructions (Addendum)
You were not tested for COVID today Get plenty of rest and push fluids.  Drink at least half your body weight in ounces Tylenol prescribed.  Take as needed for fever and/or headache Follow up with PCP next week for recheck and to ensure your symptoms are improving.   You should remain isolated in your home for 7 days from symptom onset AND greater than 72 hours after symptoms resolution (absence of fever without the use of fever-reducing medication and improvement in respiratory symptoms), whichever is longer Call or go to the ED if you have any new or worsening symptoms such as fever, worsening cough, shortness of breath, chest tightness, chest pain, turning blue, changes in mental status, etc...  Please follow up with PCP for right upper abdominal discomfort.  Symptoms most likely related to gallstones.  Follow up with PCP for further evaluation and management.  Go to the ED if you have any new or worsening symptoms such as nausea, vomiting, severe abdominal discomfort, changes in bowel or bladder habits, etc...  You may also use OTC miralax as needed for constipation

## 2018-11-02 NOTE — ED Provider Notes (Addendum)
[place]   [id number] 11/02/18 Arrival Time: 8676   CC: fever, HA, and fatigue SUBJECTIVE: History from: Phone interpreter  Deborah Sampson is a 58 y.o. female who presents with HA, subjective fever, nasal congestion, and fatigue x 2 weeks.  Denies sick exposure to COVID, flu or strep.  Denies recent travel. Localizes HA to top of head, and describes as burning.  Has tried warm water with ginger and honey with minimal relief.  Denies previous symptoms in the past.   Denies rhinorrhea, sore throat, cough, SOB, wheezing, chest pain, chest pressure, nausea, vomiting, changes in bladder habits.    Pt also mentions RUQ pain for the past few days.  Denies precipitating event.  Worse with eating.  Has a PCP.    Also mentions constipation, but last bowel movement this morning, smaller than normal.    ROS: As per HPI.  History reviewed. No pertinent past medical history. History reviewed. No pertinent surgical history. Allergies  Allergen Reactions  . Penicillins Rash   No current facility-administered medications on file prior to encounter.    Current Outpatient Medications on File Prior to Encounter  Medication Sig Dispense Refill  . ibuprofen (ADVIL,MOTRIN) 400 MG tablet Take 1 tablet (400 mg total) by mouth every 8 (eight) hours as needed for headache. 60 tablet 0  . naproxen (NAPROSYN) 500 MG tablet Take 1 tablet (500 mg total) by mouth 2 (two) times daily. 20 tablet 1  . polyethylene glycol (MIRALAX / GLYCOLAX) packet Take 17 g by mouth daily. 100 each 0  . psyllium (METAMUCIL) 0.52 g capsule Take 1 capsule (0.52 g total) by mouth daily. 60 capsule 0  . triamcinolone cream (KENALOG) 0.1 % Apply 1 application topically 2 (two) times daily. Dispense qs x4 weeks 80 g 2   Social History   Socioeconomic History  . Marital status: Married    Spouse name: Not on file  . Number of children: Not on file  . Years of education: Not on file  . Highest education level: Not on  file  Occupational History  . Not on file  Social Needs  . Financial resource strain: Not on file  . Food insecurity:    Worry: Not on file    Inability: Not on file  . Transportation needs:    Medical: Not on file    Non-medical: Not on file  Tobacco Use  . Smoking status: Never Smoker  . Smokeless tobacco: Never Used  Substance and Sexual Activity  . Alcohol use: No  . Drug use: No  . Sexual activity: Not on file  Lifestyle  . Physical activity:    Days per week: Not on file    Minutes per session: Not on file  . Stress: Not on file  Relationships  . Social connections:    Talks on phone: Not on file    Gets together: Not on file    Attends religious service: Not on file    Active member of club or organization: Not on file    Attends meetings of clubs or organizations: Not on file    Relationship status: Not on file  . Intimate partner violence:    Fear of current or ex partner: Not on file    Emotionally abused: Not on file    Physically abused: Not on file    Forced sexual activity: Not on file  Other Topics Concern  . Not on file  Social History Narrative  . Not on file  Family History  Family history unknown: Yes    OBJECTIVE:  Vitals:   11/02/18 1246  BP: (!) 141/72  Pulse: 75  Resp: 18  Temp: 98.2 F (36.8 C)  TempSrc: Oral  SpO2: 99%     General appearance: alert; appears mildly fatigued, but nontoxic; speaking in full sentences and tolerating own secretions HEENT: NCAT, mildly TTP over crown of head, no obvious rash; Ears: EACs clear, TMs pearly gray; Eyes: PERRL.  EOM grossly intact. Nose: nares patent without rhinorrhea, Throat: oropharynx clear, tonsils non erythematous or enlarged, uvula midline  Neck: supple without LAD Lungs: unlabored respirations, symmetrical air entry; cough: absent; no respiratory distress; CTAB Heart: regular rate and rhythm.  Radial pulses 2+ symmetrical bilaterally Abdomen: soft, nondistended, normal active bowel  sounds; TTP over RUQ; no guarding; + Murphy's sign Skin: warm and dry Psychological: alert and cooperative; normal mood and affect  ASSESSMENT & PLAN:  1. Fever, unspecified   2. Acute nonintractable headache, unspecified headache type   3. RUQ discomfort   4. Constipation, unspecified constipation type     Meds ordered this encounter  Medications  . acetaminophen (TYLENOL) 500 MG tablet    Sig: Take 1 tablet (500 mg total) by mouth every 6 (six) hours as needed.    Dispense:  30 tablet    Refill:  0    Order Specific Question:   Supervising Provider    Answer:   Raylene Everts [[id number]]  . fluticasone (FLONASE) 50 MCG/ACT nasal spray    Sig: Place 2 sprays into both nostrils daily.    Dispense:  16 g    Refill:  0    Order Specific Question:   Supervising Provider    Answer:   Raylene Everts [[id number]]   You were not tested for COVID today Get plenty of rest and push fluids.  Drink at least half your body weight in ounces Tylenol prescribed.  Take as needed for fever and/or headache Follow up with PCP next week for recheck and to ensure your symptoms are improving.   You should remain isolated in your home for 7 days from symptom onset AND greater than 72 hours after symptoms resolution (absence of fever without the use of fever-reducing medication and improvement in respiratory symptoms), whichever is longer Call or go to the ED if you have any new or worsening symptoms such as fever, worsening cough, shortness of breath, chest tightness, chest pain, turning blue, changes in mental status, etc...  Please follow up with PCP for right upper abdominal discomfort.  Symptoms most likely related to gallstones.  Follow up with PCP for further evaluation and management.  Go to the ED if you have any new or worsening symptoms such as nausea, vomiting, severe abdominal discomfort, changes in bowel or bladder habits, etc...  You may also use OTC miralax as needed for constipation   Flonase also sent to pharmacy for nasal congestion  Reviewed expectations re: course of current medical issues. Questions answered. Outlined signs and symptoms indicating need for more acute intervention. Patient verbalized understanding. After Visit Summary given.    Lestine Box, PA-C 11/02/18 1414

## 2018-11-02 NOTE — ED Triage Notes (Signed)
Pt presents with severe headache and fatigue that she says has progressively got worse over the past 2 weeks.

## 2018-11-21 ENCOUNTER — Observation Stay (HOSPITAL_COMMUNITY): Payer: BLUE CROSS/BLUE SHIELD | Admitting: Certified Registered"

## 2018-11-21 ENCOUNTER — Observation Stay (HOSPITAL_COMMUNITY)
Admission: EM | Admit: 2018-11-21 | Discharge: 2018-11-22 | Disposition: A | Discharge status: Home / Self Care | Payer: BLUE CROSS/BLUE SHIELD | Attending: General Surgery | Admitting: General Surgery

## 2018-11-21 ENCOUNTER — Other Ambulatory Visit: Payer: Self-pay

## 2018-11-21 ENCOUNTER — Encounter (HOSPITAL_COMMUNITY)
Admission: EM | Disposition: A | Discharge status: Home / Self Care | Payer: Self-pay | Source: Home / Self Care | Attending: Emergency Medicine

## 2018-11-21 ENCOUNTER — Ambulatory Visit (HOSPITAL_COMMUNITY)
Admission: EM | Admit: 2018-11-21 | Discharge: 2018-11-21 | Disposition: A | Discharge status: Home / Self Care | Payer: BLUE CROSS/BLUE SHIELD | Source: Home / Self Care

## 2018-11-21 ENCOUNTER — Encounter (HOSPITAL_COMMUNITY): Payer: Self-pay | Admitting: Emergency Medicine

## 2018-11-21 ENCOUNTER — Emergency Department (HOSPITAL_COMMUNITY): Payer: BLUE CROSS/BLUE SHIELD

## 2018-11-21 ENCOUNTER — Encounter (HOSPITAL_COMMUNITY): Payer: Self-pay

## 2018-11-21 DIAGNOSIS — Z79899 Other long term (current) drug therapy: Secondary | ICD-10-CM | POA: Insufficient documentation

## 2018-11-21 DIAGNOSIS — Z9049 Acquired absence of other specified parts of digestive tract: Secondary | ICD-10-CM

## 2018-11-21 DIAGNOSIS — M48061 Spinal stenosis, lumbar region without neurogenic claudication: Secondary | ICD-10-CM | POA: Diagnosis not present

## 2018-11-21 DIAGNOSIS — K3580 Unspecified acute appendicitis: Secondary | ICD-10-CM

## 2018-11-21 DIAGNOSIS — Z880 Allergy status to penicillin: Secondary | ICD-10-CM | POA: Insufficient documentation

## 2018-11-21 DIAGNOSIS — Z1159 Encounter for screening for other viral diseases: Secondary | ICD-10-CM | POA: Diagnosis not present

## 2018-11-21 DIAGNOSIS — Z791 Long term (current) use of non-steroidal anti-inflammatories (NSAID): Secondary | ICD-10-CM | POA: Diagnosis not present

## 2018-11-21 DIAGNOSIS — Z7951 Long term (current) use of inhaled steroids: Secondary | ICD-10-CM | POA: Diagnosis not present

## 2018-11-21 DIAGNOSIS — R1031 Right lower quadrant pain: Secondary | ICD-10-CM

## 2018-11-21 DIAGNOSIS — K5900 Constipation, unspecified: Secondary | ICD-10-CM | POA: Diagnosis not present

## 2018-11-21 HISTORY — DX: Acquired absence of other specified parts of digestive tract: Z90.49

## 2018-11-21 HISTORY — PX: LAPAROSCOPIC APPENDECTOMY: SHX408

## 2018-11-21 HISTORY — DX: Unspecified acute appendicitis: K35.80

## 2018-11-21 LAB — COMPREHENSIVE METABOLIC PANEL
ALT: 22 U/L (ref 0–44)
AST: 20 U/L (ref 15–41)
Albumin: 3.6 g/dL (ref 3.5–5.0)
Alkaline Phosphatase: 63 U/L (ref 38–126)
Anion gap: 10 (ref 5–15)
BUN: 6 mg/dL (ref 6–20)
CO2: 26 mmol/L (ref 22–32)
Calcium: 9.6 mg/dL (ref 8.9–10.3)
Chloride: 101 mmol/L (ref 98–111)
Creatinine, Ser: 0.83 mg/dL (ref 0.44–1.00)
GFR calc Af Amer: >60 mL/min (ref >60)
GFR calc non Af Amer: >60 mL/min (ref >60)
Glucose, Bld: 115 mg/dL — ABNORMAL HIGH (ref 70–99)
Potassium: 4.3 mmol/L (ref 3.5–5.1)
Sodium: 137 mmol/L (ref 135–145)
Total Bilirubin: 0.7 mg/dL (ref 0.3–1.2)
Total Protein: 7.6 g/dL (ref 6.5–8.1)

## 2018-11-21 LAB — URINALYSIS, ROUTINE W REFLEX MICROSCOPIC
Bacteria, UA: NONE SEEN
Bilirubin Urine: NEGATIVE
Glucose, UA: NEGATIVE mg/dL
Ketones, ur: NEGATIVE mg/dL
Nitrite: NEGATIVE
Protein, ur: NEGATIVE mg/dL
Specific Gravity, Urine: 1.012 (ref 1.005–1.030)
pH: 6 (ref 5.0–8.0)

## 2018-11-21 LAB — POCT URINALYSIS DIP (DEVICE)
Bilirubin Urine: NEGATIVE
Glucose, UA: 100 mg/dL — AB
Ketones, ur: NEGATIVE mg/dL
Leukocytes,Ua: NEGATIVE
Nitrite: NEGATIVE
Protein, ur: NEGATIVE mg/dL
Specific Gravity, Urine: 1.025 (ref 1.005–1.030)
Urobilinogen, UA: 0.2 mg/dL (ref 0.0–1.0)
pH: 6 (ref 5.0–8.0)

## 2018-11-21 LAB — CBC
HCT: 39.9 % (ref 36.0–46.0)
Hemoglobin: 13.2 g/dL (ref 12.0–15.0)
MCH: 28.1 pg (ref 26.0–34.0)
MCHC: 33.1 g/dL (ref 30.0–36.0)
MCV: 84.9 fL (ref 80.0–100.0)
Platelets: 285 10*3/uL (ref 150–400)
RBC: 4.7 MIL/uL (ref 3.87–5.11)
RDW: 11.6 % (ref 11.5–15.5)
WBC: 13.9 10*3/uL — ABNORMAL HIGH (ref 4.0–10.5)
nRBC: 0 % (ref 0.0–0.2)

## 2018-11-21 LAB — LIPASE, BLOOD: Lipase: 27 U/L (ref 11–51)

## 2018-11-21 LAB — I-STAT BETA HCG BLOOD, ED (MC, WL, AP ONLY): I-stat hCG, quantitative: <5 m[IU]/mL (ref <5)

## 2018-11-21 LAB — SARS CORONAVIRUS 2 BY RT PCR (HOSPITAL ORDER, PERFORMED IN ~~LOC~~ HOSPITAL LAB): SARS Coronavirus 2: NEGATIVE

## 2018-11-21 SURGERY — APPENDECTOMY, LAPAROSCOPIC
Anesthesia: General | Site: Abdomen

## 2018-11-21 MED ORDER — KETOROLAC TROMETHAMINE 15 MG/ML IJ SOLN: 15.0000 mg | Freq: Four times a day (QID) | INTRAMUSCULAR | Status: DC | PRN

## 2018-11-21 MED ORDER — MORPHINE SULFATE (PF) 2 MG/ML IV SOLN: 1.0000 mg | INTRAVENOUS | Status: DC | PRN

## 2018-11-21 MED ORDER — OXYCODONE HCL 5 MG PO TABS: 5.0000 mg | ORAL_TABLET | ORAL | 0 refills | Status: DC | PRN

## 2018-11-21 MED ORDER — ACETAMINOPHEN 500 MG PO TABS: 1000.0000 mg | ORAL_TABLET | Freq: Four times a day (QID) | ORAL | Status: DC

## 2018-11-21 MED ORDER — LACTATED RINGERS IV SOLN
INTRAVENOUS | Status: DC | PRN
  Administered 2018-11-21: 18:00:00 via INTRAVENOUS

## 2018-11-21 MED ORDER — SUCCINYLCHOLINE CHLORIDE 200 MG/10ML IV SOSY
PREFILLED_SYRINGE | INTRAVENOUS | Status: DC | PRN
  Administered 2018-11-21: 80 mg via INTRAVENOUS

## 2018-11-21 MED ORDER — SODIUM CHLORIDE 0.9% FLUSH: 3.0000 mL | Freq: Once | INTRAVENOUS | Status: DC

## 2018-11-21 MED ORDER — IOHEXOL 300 MG/ML  SOLN
100.0000 mL | Freq: Once | INTRAMUSCULAR | Status: AC | PRN
  Administered 2018-11-21: 100 mL via INTRAVENOUS

## 2018-11-21 MED ORDER — ACETAMINOPHEN 500 MG PO TABS
1000.0000 mg | ORAL_TABLET | Freq: Four times a day (QID) | ORAL | Status: DC
  Administered 2018-11-21: 1000 mg via ORAL
  Filled 2018-11-21: qty 2

## 2018-11-21 MED ORDER — OXYCODONE HCL 5 MG PO TABS: 5.0000 mg | ORAL_TABLET | ORAL | Status: DC | PRN

## 2018-11-21 MED ORDER — ONDANSETRON 4 MG PO TBDP: 4.0000 mg | ORAL_TABLET | Freq: Four times a day (QID) | ORAL | Status: DC | PRN

## 2018-11-21 MED ORDER — SUGAMMADEX SODIUM 200 MG/2ML IV SOLN
INTRAVENOUS | Status: DC | PRN
  Administered 2018-11-21: 150 mg via INTRAVENOUS

## 2018-11-21 MED ORDER — POTASSIUM CHLORIDE 2 MEQ/ML IV SOLN: INTRAVENOUS | Status: DC

## 2018-11-21 MED ORDER — LACTATED RINGERS IV SOLN
INTRAVENOUS | Status: DC
  Administered 2018-11-21: 18:00:00 via INTRAVENOUS

## 2018-11-21 MED ORDER — ONDANSETRON HCL 4 MG/2ML IJ SOLN: 4.0000 mg | Freq: Four times a day (QID) | INTRAMUSCULAR | Status: DC | PRN

## 2018-11-21 MED ORDER — FENTANYL CITRATE (PF) 100 MCG/2ML IJ SOLN
INTRAMUSCULAR | Status: DC | PRN
  Administered 2018-11-21 (×2): 50 ug via INTRAVENOUS
  Administered 2018-11-21: 100 ug via INTRAVENOUS
  Administered 2018-11-21: 50 ug via INTRAVENOUS

## 2018-11-21 MED ORDER — ONDANSETRON HCL 4 MG/2ML IJ SOLN
INTRAMUSCULAR | Status: DC | PRN
  Administered 2018-11-21: 4 mg via INTRAVENOUS

## 2018-11-21 MED ORDER — MIDAZOLAM HCL 5 MG/5ML IJ SOLN
INTRAMUSCULAR | Status: DC | PRN
  Administered 2018-11-21: 2 mg via INTRAVENOUS

## 2018-11-21 MED ORDER — SODIUM CHLORIDE 0.9 % IV SOLN
1.0000 g | Freq: Two times a day (BID) | INTRAVENOUS | Status: DC
  Administered 2018-11-21: 1 g via INTRAVENOUS
  Filled 2018-11-21 (×2): qty 1

## 2018-11-21 MED ORDER — SODIUM CHLORIDE 0.9 % IV SOLN: INTRAVENOUS | Status: DC

## 2018-11-21 MED ORDER — PROPOFOL 10 MG/ML IV BOLUS
INTRAVENOUS | Status: AC
  Filled 2018-11-21: qty 20

## 2018-11-21 MED ORDER — BUPIVACAINE-EPINEPHRINE 0.25% -1:200000 IJ SOLN
INTRAMUSCULAR | Status: DC | PRN
  Administered 2018-11-21: 20 mL

## 2018-11-21 MED ORDER — KETOROLAC TROMETHAMINE 30 MG/ML IJ SOLN: 30.0000 mg | Freq: Once | INTRAMUSCULAR | Status: DC | PRN

## 2018-11-21 MED ORDER — ROCURONIUM BROMIDE 50 MG/5ML IV SOSY
PREFILLED_SYRINGE | INTRAVENOUS | Status: DC | PRN
  Administered 2018-11-21: 40 mg via INTRAVENOUS

## 2018-11-21 MED ORDER — PROPOFOL 10 MG/ML IV BOLUS
INTRAVENOUS | Status: DC | PRN
  Administered 2018-11-21: 130 mg via INTRAVENOUS

## 2018-11-21 MED ORDER — HYDROMORPHONE HCL 1 MG/ML IJ SOLN: 0.2500 mg | INTRAMUSCULAR | Status: DC | PRN

## 2018-11-21 MED ORDER — ENOXAPARIN SODIUM 40 MG/0.4ML ~~LOC~~ SOLN: 40.0000 mg | SUBCUTANEOUS | Status: DC

## 2018-11-21 MED ORDER — IBUPROFEN 400 MG PO TABS: 600.0000 mg | ORAL_TABLET | Freq: Three times a day (TID) | ORAL | Status: DC

## 2018-11-21 MED ORDER — MORPHINE SULFATE (PF) 2 MG/ML IV SOLN
2.0000 mg | Freq: Once | INTRAVENOUS | Status: AC
  Administered 2018-11-21: 15:00:00 2 mg via INTRAVENOUS
  Filled 2018-11-21: qty 1

## 2018-11-21 MED ORDER — CELECOXIB 200 MG PO CAPS
400.0000 mg | ORAL_CAPSULE | ORAL | Status: AC
  Administered 2018-11-21: 400 mg via ORAL
  Filled 2018-11-21 (×2): qty 2

## 2018-11-21 MED ORDER — FENTANYL CITRATE (PF) 250 MCG/5ML IJ SOLN
INTRAMUSCULAR | Status: AC
  Filled 2018-11-21: qty 5

## 2018-11-21 MED ORDER — DEXAMETHASONE SODIUM PHOSPHATE 10 MG/ML IJ SOLN
INTRAMUSCULAR | Status: DC | PRN
  Administered 2018-11-21: 10 mg via INTRAVENOUS

## 2018-11-21 MED ORDER — ACETAMINOPHEN 500 MG PO TABS
1000.0000 mg | ORAL_TABLET | ORAL | Status: AC
  Administered 2018-11-21: 1000 mg via ORAL
  Filled 2018-11-21: qty 2

## 2018-11-21 MED ORDER — 0.9 % SODIUM CHLORIDE (POUR BTL) OPTIME
TOPICAL | Status: DC | PRN
  Administered 2018-11-21: 1000 mL

## 2018-11-21 MED ORDER — BUPIVACAINE-EPINEPHRINE (PF) 0.25% -1:200000 IJ SOLN
INTRAMUSCULAR | Status: AC
  Filled 2018-11-21: qty 30

## 2018-11-21 MED ORDER — SIMETHICONE 80 MG PO CHEW: 40.0000 mg | CHEWABLE_TABLET | Freq: Four times a day (QID) | ORAL | Status: DC | PRN

## 2018-11-21 MED ORDER — SODIUM CHLORIDE 0.9 % IV SOLN
1.0000 g | Freq: Two times a day (BID) | INTRAVENOUS | Status: DC
  Filled 2018-11-21 (×3): qty 1

## 2018-11-21 MED ORDER — MIDAZOLAM HCL 2 MG/2ML IJ SOLN
INTRAMUSCULAR | Status: AC
  Filled 2018-11-21: qty 2

## 2018-11-21 MED ORDER — PROMETHAZINE HCL 25 MG/ML IJ SOLN: 6.2500 mg | INTRAMUSCULAR | Status: DC | PRN

## 2018-11-21 MED ORDER — POLYETHYLENE GLYCOL 3350 17 G PO PACK: 17.0000 g | PACK | Freq: Every day | ORAL | Status: DC

## 2018-11-21 MED ORDER — LIDOCAINE 2% (20 MG/ML) 5 ML SYRINGE
INTRAMUSCULAR | Status: DC | PRN
  Administered 2018-11-21: 60 mg via INTRAVENOUS

## 2018-11-21 MED ORDER — SODIUM CHLORIDE 0.9 % IR SOLN
Status: DC | PRN
  Administered 2018-11-21: 1000 mL

## 2018-11-21 SURGICAL SUPPLY — 43 items
APPLIER CLIP ROT 10 11.4 M/L (STAPLE)
CANISTER SUCT 3000ML PPV (MISCELLANEOUS) ×3 IMPLANT
CHLORAPREP W/TINT 26ML (MISCELLANEOUS) ×3 IMPLANT
CLIP APPLIE ROT 10 11.4 M/L (STAPLE) IMPLANT
CLOSURE WOUND 1/2 X4 (GAUZE/BANDAGES/DRESSINGS) ×1
COVER SURGICAL LIGHT HANDLE (MISCELLANEOUS) ×3 IMPLANT
COVER WAND RF STERILE (DRAPES) ×3 IMPLANT
CUTTER FLEX LINEAR 45M (STAPLE) ×3 IMPLANT
DERMABOND ADVANCED (GAUZE/BANDAGES/DRESSINGS) ×2
DERMABOND ADVANCED .7 DNX12 (GAUZE/BANDAGES/DRESSINGS) ×1 IMPLANT
ELECT REM PT RETURN 9FT ADLT (ELECTROSURGICAL) ×3
ELECTRODE REM PT RTRN 9FT ADLT (ELECTROSURGICAL) ×1 IMPLANT
GLOVE BIO SURGEON STRL SZ7 (GLOVE) ×3 IMPLANT
GLOVE BIOGEL PI IND STRL 7.5 (GLOVE) ×1 IMPLANT
GLOVE BIOGEL PI INDICATOR 7.5 (GLOVE) ×2
GOWN STRL REUS W/ TWL LRG LVL3 (GOWN DISPOSABLE) ×3 IMPLANT
GOWN STRL REUS W/TWL LRG LVL3 (GOWN DISPOSABLE) ×6
GRASPER SUT TROCAR 14GX15 (MISCELLANEOUS) ×3 IMPLANT
KIT BASIN OR (CUSTOM PROCEDURE TRAY) ×3 IMPLANT
KIT TURNOVER KIT B (KITS) ×3 IMPLANT
NS IRRIG 1000ML POUR BTL (IV SOLUTION) ×3 IMPLANT
PAD ABD 7.5X8 STRL (GAUZE/BANDAGES/DRESSINGS) ×3 IMPLANT
PAD ARMBOARD 7.5X6 YLW CONV (MISCELLANEOUS) ×6 IMPLANT
POUCH RETRIEVAL ECOSAC 10 (ENDOMECHANICALS) ×1 IMPLANT
POUCH RETRIEVAL ECOSAC 10MM (ENDOMECHANICALS) ×2
RELOAD 45 VASCULAR/THIN (ENDOMECHANICALS) IMPLANT
RELOAD STAPLE TA45 3.5 REG BLU (ENDOMECHANICALS) ×3 IMPLANT
SCISSORS LAP 5X35 DISP (ENDOMECHANICALS) ×3 IMPLANT
SET IRRIG TUBING LAPAROSCOPIC (IRRIGATION / IRRIGATOR) ×3 IMPLANT
SET TUBE SMOKE EVAC HIGH FLOW (TUBING) ×3 IMPLANT
SHEARS HARMONIC ACE PLUS 36CM (ENDOMECHANICALS) ×3 IMPLANT
SLEEVE ENDOPATH XCEL 5M (ENDOMECHANICALS) ×3 IMPLANT
SPECIMEN JAR SMALL (MISCELLANEOUS) ×3 IMPLANT
STRIP CLOSURE SKIN 1/2X4 (GAUZE/BANDAGES/DRESSINGS) ×2 IMPLANT
SUT MNCRL AB 4-0 PS2 18 (SUTURE) ×3 IMPLANT
SUT VICRYL 0 UR6 27IN ABS (SUTURE) ×3 IMPLANT
TOWEL OR 17X24 6PK STRL BLUE (TOWEL DISPOSABLE) ×3 IMPLANT
TOWEL OR 17X26 10 PK STRL BLUE (TOWEL DISPOSABLE) ×3 IMPLANT
TRAY FOLEY CATH SILVER 16FR (SET/KITS/TRAYS/PACK) ×3 IMPLANT
TRAY LAPAROSCOPIC MC (CUSTOM PROCEDURE TRAY) ×3 IMPLANT
TROCAR XCEL BLUNT TIP 100MML (ENDOMECHANICALS) ×3 IMPLANT
TROCAR XCEL NON-BLD 5MMX100MML (ENDOMECHANICALS) ×3 IMPLANT
WATER STERILE IRR 1000ML POUR (IV SOLUTION) ×3 IMPLANT

## 2018-11-21 NOTE — H&P (Signed)
[facility] Admission Note  Deborah Sampson [birth year]  [id number].    Requesting MD: Ray Chief Complaint/Reason for Consult: appendicitis HPI:  Patient is a 58 year old female who presented from home after going to urgent care. Pain started yesterday evening, is 8/10 located in the RLQ and is worse with trying to defecate. Feels constipated. Associated nausea without vomiting. Denied fever, chills, chest pain, SOB, urinary symptoms. Denied recent travel or known exposure to anyone with COVID-19. No other PMH reported. No blood thinning medications. Allergic to PCNs.   Past abdominal surgeries: none  ROS: Review of Systems  Constitutional: Negative for chills and fever.  Respiratory: Negative for shortness of breath and wheezing.   Cardiovascular: Negative for chest pain and palpitations.  Gastrointestinal: Positive for abdominal pain, constipation and nausea. Negative for vomiting.  Genitourinary: Negative for dysuria, frequency and urgency.  All other systems reviewed and are negative.   Family History  Family history unknown: Yes    History reviewed. No pertinent past medical history.  History reviewed. No pertinent surgical history.  Social History:  reports that she has never smoked. She has never used smokeless tobacco. She reports that she does not drink alcohol or use drugs.  Allergies:  Allergies  Allergen Reactions  . Penicillins Rash    (Not in a hospital admission)   Blood pressure 118/85, pulse 90, temperature 99.1 F (37.3 C), temperature source Oral, resp. rate 18, SpO2 99 %. Physical Exam: Physical Exam Vitals signs reviewed.  Constitutional:      General: She is not in acute distress.    Appearance: She is well-developed.  HENT:     Head: Normocephalic and atraumatic.     Mouth/Throat:     Mouth: Mucous membranes are moist.  Eyes:     General: No scleral icterus.    Extraocular Movements: Extraocular movements intact.     Pupils:  Pupils are equal, round, and reactive to light.  Cardiovascular:     Rate and Rhythm: Normal rate and regular rhythm.     Heart sounds: Normal heart sounds.  Pulmonary:     Effort: Pulmonary effort is normal. No respiratory distress.     Breath sounds: Normal breath sounds. No stridor. No wheezing, rhonchi or rales.  Chest:     Chest wall: No tenderness.  Abdominal:     General: Bowel sounds are normal. There is no distension.     Palpations: Abdomen is soft. There is no hepatomegaly or splenomegaly.     Tenderness: There is abdominal tenderness in the right lower quadrant. There is no guarding or rebound.     Hernia: No hernia is present.  Skin:    General: Skin is warm and dry.  Neurological:     General: No focal deficit present.     Mental Status: She is alert and oriented to person, place, and time.     Cranial Nerves: No cranial nerve deficit.  Psychiatric:        Mood and Affect: Mood normal.        Behavior: Behavior normal.     Results for orders placed or performed during the hospital encounter of 11/21/18 (from the past 48 hour(s))  Lipase, blood     Status: None   Collection Time: 11/21/18 11:35 AM  Result Value Ref Range   Lipase 27 11 - 51 U/L    Comment: Performed at [facility], [place] [address] [place], [place] [address]  Comprehensive metabolic panel  Status: Abnormal   Collection Time: 11/21/18 11:35 AM  Result Value Ref Range   Sodium 137 135 - 145 mmol/L   Potassium 4.3 3.5 - 5.1 mmol/L   Chloride 101 98 - 111 mmol/L   CO2 26 22 - 32 mmol/L   Glucose, Bld 115 (H) 70 - 99 mg/dL   BUN 6 6 - 20 mg/dL   Creatinine, Ser 0.83 0.44 - 1.00 mg/dL   Calcium 9.6 8.9 - 10.3 mg/dL   Total Protein 7.6 6.5 - 8.1 g/dL   Albumin 3.6 3.5 - 5.0 g/dL   AST 20 15 - 41 U/L   ALT 22 0 - 44 U/L   Alkaline Phosphatase 63 38 - 126 U/L   Total Bilirubin 0.7 0.3 - 1.2 mg/dL   GFR calc non Af Amer >60 >60 mL/min   GFR calc Af Amer >60 >60 mL/min   Anion gap 10 5 -  15    Comment: Performed at [facility], [address], [place], [place] [address]  CBC     Status: Abnormal   Collection Time: 11/21/18 11:35 AM  Result Value Ref Range   WBC 13.9 (H) 4.0 - 10.5 K/uL   RBC 4.70 3.87 - 5.11 MIL/uL   Hemoglobin 13.2 12.0 - 15.0 g/dL   HCT 39.9 36.0 - 46.0 %   MCV 84.9 80.0 - 100.0 fL   MCH 28.1 26.0 - 34.0 pg   MCHC 33.1 30.0 - 36.0 g/dL   RDW 11.6 11.5 - 15.5 %   Platelets 285 150 - 400 K/uL   nRBC 0.0 0.0 - 0.2 %    Comment: Performed at [facility], [place] [address] [place], [place] [address]  I-Stat beta hCG blood, ED     Status: None   Collection Time: 11/21/18 11:41 AM  Result Value Ref Range   I-stat hCG, quantitative <5.0 <5 mIU/mL   Comment 3            Comment:   GEST. AGE      CONC.  (mIU/mL)   <=1 WEEK        5 - 50     2 WEEKS       50 - 500     3 WEEKS       100 - 10,000     4 WEEKS     1,000 - 30,000        FEMALE AND NON-PREGNANT FEMALE:     LESS THAN 5 mIU/mL   Urinalysis, Routine w reflex microscopic     Status: Abnormal   Collection Time: 11/21/18  1:08 PM  Result Value Ref Range   Color, Urine YELLOW YELLOW   APPearance CLEAR CLEAR   Specific Gravity, Urine 1.012 1.005 - 1.030   pH 6.0 5.0 - 8.0   Glucose, UA NEGATIVE NEGATIVE mg/dL   Hgb urine dipstick SMALL (A) NEGATIVE   Bilirubin Urine NEGATIVE NEGATIVE   Ketones, ur NEGATIVE NEGATIVE mg/dL   Protein, ur NEGATIVE NEGATIVE mg/dL   Nitrite NEGATIVE NEGATIVE   Leukocytes,Ua TRACE (A) NEGATIVE   RBC / HPF 0-5 0 - 5 RBC/hpf   WBC, UA 0-5 0 - 5 WBC/hpf   Bacteria, UA NONE SEEN NONE SEEN   Squamous Epithelial / LPF 0-5 0 - 5   Mucus PRESENT     Comment: Performed at [facility], [place] [address], [place], [place] [address]   Ct Abdomen Pelvis W Contrast  Result Date: 11/21/2018  CLINICAL DATA:  Right lower quadrant abdominal pain. EXAM: CT ABDOMEN AND PELVIS WITH CONTRAST TECHNIQUE: Multidetector CT imaging of the abdomen and pelvis was performed  using the standard protocol following bolus administration of intravenous contrast. CONTRAST:  160mL OMNIPAQUE IOHEXOL 300 MG/ML  SOLN COMPARISON:  CT scan dated 12/22/2016 FINDINGS: Lower chest: Minimal linear scarring at the left lung base. Otherwise normal. Hepatobiliary: No focal liver abnormality is seen. No gallstones, gallbladder wall thickening, or biliary dilatation. Pancreas: Unremarkable. No pancreatic ductal dilatation or surrounding inflammatory changes. Spleen: Normal in size without focal abnormality. Adrenals/Urinary Tract: Adrenal glands are normal. Fourteen and 13 mm simple appearing cyst in the upper pole of the otherwise normal left kidney. 9 mm cyst in the mid right kidney. The right kidney is otherwise normal. No hydronephrosis. Bladder appears normal. Stomach/Bowel: The appendix is dilated to 14 mm with periappendiceal inflammation. The stomach and small bowel and the remainder of the colon are normal. Vascular/Lymphatic: No significant vascular findings are present. No enlarged abdominal or pelvic lymph nodes. Reproductive: Uterus and bilateral adnexa are unremarkable. Other: No abdominal wall hernia or abnormality. No abdominopelvic ascites. Musculoskeletal: No acute abnormality. Moderate degenerative disc disease at L4-5 with a broad-based disc protrusion with hypertrophy of the ligamentum flavum and facet joints creating moderately severe compression of the thecal sac. IMPRESSION: 1. Acute appendicitis. 2. Moderately severe spinal stenosis at L4-5. 3. Critical Value/emergent results were called by telephone at the time of interpretation on 11/21/2018 at 3:32 pm to Mr. Penni Bombard, [place], who verbally acknowledged these results. Electronically Signed   By: Lorriane Shire M.D.   On: 11/21/2018 15:32      Assessment/Plan Moderately severe L4-5 spinal stenosis - seen on CT today  Acute appendicitis - CT today: The appendix is dilated to 14 mm with periappendiceal Inflammation. - WBC 13.9,  afebrile - to OR for laparoscopic appendectomy - risks and benefits of surgery discussed with patient and her husband with an interpretor, and she wishes to proceed  Wellington Hampshire, [facility] 11/21/2018, 5:31 PM Pager: [phone] Mon 7:00 am -11:30 AM Tues-Fri 7:00 am-4:30 pm Sat-Sun 7:00 am-11:30 am

## 2018-11-21 NOTE — Discharge Instructions (Signed)
Patient with 2 day history of RLQ pain that is constant, stabbing pain. Has other generalized abdominal pain that is intermittent. She has had nausea with 1 episode of vomiting, has not tolerated oral intake since then. Has had subjective fever last night.  Had RLQ tenderness and suprapubic tenderness on exam. Guarding without obvious rebound.  Discharged to ED for further evaluation to rule out appendicitis.

## 2018-11-21 NOTE — ED Triage Notes (Signed)
Pt states she has abdominal pain on the right side. This started yesterday.

## 2018-11-21 NOTE — Anesthesia Preprocedure Evaluation (Addendum)
Anesthesia Evaluation  Patient identified by MRN, date of birth, ID band Patient awake    Reviewed: Allergy & Precautions, NPO status , Patient's Chart, lab work & pertinent test results  Airway Mallampati: II  TM Distance: >3 FB Neck ROM: Full    Dental no notable dental hx. (+) Teeth Intact, Dental Advisory Given   Pulmonary neg pulmonary ROS,    Pulmonary exam normal breath sounds clear to auscultation       Cardiovascular negative cardio ROS Normal cardiovascular exam Rhythm:Regular Rate:Normal     Neuro/Psych negative neurological ROS  negative psych ROS   GI/Hepatic negative GI ROS, Neg liver ROS,   Endo/Other  negative endocrine ROS  Renal/GU negative Renal ROS  negative genitourinary   Musculoskeletal negative musculoskeletal ROS (+)   Abdominal   Peds negative pediatric ROS (+)  Hematology negative hematology ROS (+)   Anesthesia Other Findings   Reproductive/Obstetrics negative OB ROS                            Anesthesia Physical Anesthesia Plan  ASA: I  Anesthesia Plan: General   Post-op Pain Management:    Induction: Intravenous, Rapid sequence and Cricoid pressure planned  PONV Risk Score and Plan: 3 and Ondansetron, Dexamethasone, Treatment may vary due to age or medical condition and Midazolam  Airway Management Planned: Oral ETT  Additional Equipment:   Intra-op Plan:   Post-operative Plan: Extubation in OR  Informed Consent: I have reviewed the patients History and Physical, chart, labs and discussed the procedure including the risks, benefits and alternatives for the proposed anesthesia with the patient or authorized representative who has indicated his/her understanding and acceptance.     Dental advisory given  Plan Discussed with: CRNA and Surgeon  Anesthesia Plan Comments:        Anesthesia Quick Evaluation

## 2018-11-21 NOTE — Anesthesia Postprocedure Evaluation (Signed)
Anesthesia Post Note  Patient: Materials engineer  Procedure(s) Performed: APPENDECTOMY LAPAROSCOPIC (N/A Abdomen)     Patient location during evaluation: PACU Anesthesia Type: General Level of consciousness: awake and alert Pain management: pain level controlled Vital Signs Assessment: post-procedure vital signs reviewed and stable Respiratory status: spontaneous breathing, nonlabored ventilation and respiratory function stable Cardiovascular status: blood pressure returned to baseline and stable Postop Assessment: no apparent nausea or vomiting Anesthetic complications: no    Last Vitals:  Vitals:   11/21/18 2010 11/21/18 2024  BP: 128/75 121/70  Pulse: 76 67  Resp: 17 11  Temp:    SpO2: 98% 96%    Last Pain:  Vitals:   11/21/18 1614  TempSrc:   PainSc: 7                  Merrit Friesen,W. EDMOND

## 2018-11-21 NOTE — Discharge Instructions (Signed)
[facility] LAPAROSCOPIC SURG[facility]: POST OP INSTRUCTIONS  Always review your discharge instruction sheet given to you by the facility where your surgery was performed. IF YOU HAVE DISABILITY OR FAMILY LEAVE FORMS, YOU MUST BRING THEM TO THE OFFICE FOR PROCESSING.   DO NOT GIVE THEM TO YOUR DOCTOR.  1. A prescription for pain medication may be given to you upon discharge.  Take your pain medication as prescribed, if needed.  If narcotic pain medicine is not needed, then you may take acetaminophen (Tylenol), naprosyn (Alleve), or ibuprofen (Advil) as needed. 2. Take your usually prescribed medications unless otherwise directed. 3. If you need a refill on your pain medication, please contact your pharmacy.  They will contact our office to request authorization. Prescriptions will not be filled after 5pm or on week-ends. 4. You should follow a light diet the first few days after arrival home, such as soup and crackers, etc.  Be sure to include lots of fluids daily. 5. Most patients will experience some swelling and bruising in the area of the incisions.  Ice packs will help.  Swelling and bruising can take several days to resolve.  6. It is common to experience some constipation if taking pain medication after surgery.  Increasing fluid intake and taking a stool softener (such as Colace) will usually help or prevent this problem from occurring.  A mild laxative (Milk of Magnesia or Miralax) should be taken according to package instructions if there are no bowel movements after 48 hours. 7. Unless discharge instructions indicate otherwise, you may remove your bandages 48 hours after surgery, and you may shower at that time.  You may have steri-strips (small skin tapes) in place directly over the incision.  These strips should be left on the skin for 7-10 days.  If your surgeon used skin glue on the incision, you may shower in 24 hours.  The glue will flake  off over the next 2-3 weeks.  Any sutures or staples will be removed at the office during your follow-up visit. 8. ACTIVITIES:  You may resume regular (light) daily activities beginning the next day--such as daily self-care, walking, climbing stairs--gradually increasing activities as tolerated.  You may have sexual intercourse when it is comfortable.  Refrain from any heavy lifting or straining until approved by your doctor. a. You may drive when you are no longer taking prescription pain medication, you can comfortably wear a seatbelt, and you can safely maneuver your car and apply brakes. b. RETURN TO WORK:  __________________________________________________________ 9. You should see your doctor in the office for a follow-up appointment approximately 2-3 weeks after your surgery.  Make sure that you call for this appointment within a day or two after you arrive home to insure a convenient appointment time. 10. OTHER INSTRUCTIONS: __________________________________________________________________________________________________________________________ __________________________________________________________________________________________________________________________ WHEN TO CALL YOUR DOCTOR: 1. Fever over 101.0 2. Inability to urinate 3. Continued bleeding from incision. 4. Increased pain, redness, or drainage from the incision. 5. Increasing abdominal pain  The clinic staff is available to answer your questions during regular business hours.  Please don't hesitate to call and ask to speak to one of the nurses for clinical concerns.  If you have a medical emergency, go to the nearest emergency room or call 911.  A surgeon from [facility] is always on call at the hospital. [address], [address], [place], [place]  [address] ? [address] [address], , [place]   [address] [phone] ? [phone] ? [phone]   [phone] Web site: [email or web address]  

## 2018-11-21 NOTE — Anesthesia Procedure Notes (Signed)
Procedure Name: Intubation Date/Time: 11/21/2018 6:29 PM Performed by: Orlie Dakin, CRNA Pre-anesthesia Checklist: Emergency Drugs available, Patient identified, Suction available and Patient being monitored Patient Re-evaluated:Patient Re-evaluated prior to induction Oxygen Delivery Method: Circle system utilized Preoxygenation: Pre-oxygenation with 100% oxygen Induction Type: IV induction and Rapid sequence Laryngoscope Size: Miller and 3 Grade View: Grade I Tube type: Oral Tube size: 7.0 mm Number of attempts: 1 Airway Equipment and Method: Stylet Placement Confirmation: ETT inserted through vocal cords under direct vision,  positive ETCO2 and breath sounds checked- equal and bilateral Secured at: 22 cm Tube secured with: Tape Comments: 4x4s bite block used.

## 2018-11-21 NOTE — ED Provider Notes (Signed)
[place]    CSN: [id number] Arrival date & time: 11/21/18  0846     History   Chief Complaint Chief Complaint  Patient presents with  . Abdominal Pain    HPI Deborah Sampson is a 58 y.o. female.   58 year old female comes in for 2-day history of right lower quadrant pain.  HPI obtained by patient through phone translator.  Pain is currently significantly that is constant, stabbing in sensation.  States she also has generalized abdominal pain that is intermittent. She has had nausea with one episode of vomiting.  She tried to eat some food last night which she seems to tolerate, but has not tried oral intake today.  She had subjective fever last night.  Denies urinary symptoms such as frequency, dysuria, hematuria.  Denies vaginal discharge, itching.  She has not tried anything for the symptoms.     History reviewed. No pertinent past medical history.  Patient Active Problem List   Diagnosis Date Noted  . Hypopigmentation 03/15/2017  . Headache 12/15/2016  . Constipation 01/24/2016  . Refugee health examination 11/27/2015  . Back pain 11/27/2015  . Palpitations 11/27/2015  . Scars 11/27/2015    History reviewed. No pertinent surgical history.  OB History   No obstetric history on file.      Home Medications    Prior to Admission medications   Medication Sig Start Date End Date Taking? Authorizing Provider  acetaminophen (TYLENOL) 500 MG tablet Take 1 tablet (500 mg total) by mouth every 6 (six) hours as needed. 11/02/18   Wurst, [place], PA-C  fluticasone (FLONASE) 50 MCG/ACT nasal spray Place 2 sprays into both nostrils daily. 11/02/18   Wurst, [place], PA-C  ibuprofen (ADVIL,MOTRIN) 400 MG tablet Take 1 tablet (400 mg total) by mouth every 8 (eight) hours as needed for headache. 11/12/17   Diallo, Earna Coder, MD  naproxen (NAPROSYN) 500 MG tablet Take 1 tablet (500 mg total) by mouth 2 (two) times daily. 11/01/18   Alveda Reasons, MD  polyethylene  glycol [facility] / Floria Raveling) packet Take 17 g by mouth daily. 11/12/17   Diallo, Earna Coder, MD  psyllium (METAMUCIL) 0.52 g capsule Take 1 capsule (0.52 g total) by mouth daily. 11/12/17   Diallo, Earna Coder, MD  triamcinolone cream (KENALOG) 0.1 % Apply 1 application topically 2 (two) times daily. Dispense qs x4 weeks 04/23/17   Alveda Reasons, MD    Family History Family History  Family history unknown: Yes    Social History Social History   Tobacco Use  . Smoking status: Never Smoker  . Smokeless tobacco: Never Used  Substance Use Topics  . Alcohol use: No  . Drug use: No     Allergies   Penicillins   Review of Systems Review of Systems  Reason unable to perform ROS: See HPI as above.     Physical Exam Triage Vital Signs ED Triage Vitals  Enc Vitals Group     BP 11/21/18 0929 120/68     Pulse Rate 11/21/18 0929 99     Resp 11/21/18 0929 16     Temp 11/21/18 0929 99 F (37.2 C)     Temp Source 11/21/18 0929 Oral     SpO2 11/21/18 0929 100 %     Weight 11/21/18 0925 160 lb (72.6 kg)     Height --      Head Circumference --      Peak Flow --      Pain Score 11/21/18 0925 6  Pain Loc --      Pain Edu? --      Excl. in [place]? --    No data found.  Updated Vital Signs BP 120/68 (BP Location: Left Arm)   Pulse 99   Temp 99 F (37.2 C) (Oral)   Resp 16   Wt 160 lb (72.6 kg)   SpO2 100%   BMI 27.46 kg/m   Physical Exam Constitutional:      General: She is not in acute distress.    Appearance: She is well-developed. She is not ill-appearing, toxic-appearing or diaphoretic.  HENT:     Head: Normocephalic and atraumatic.  Eyes:     Conjunctiva/sclera: Conjunctivae normal.     Pupils: Pupils are equal, round, and reactive to light.  Cardiovascular:     Rate and Rhythm: Normal rate and regular rhythm.     Heart sounds: Normal heart sounds. No murmur. No friction rub. No gallop.   Pulmonary:     Effort: Pulmonary effort is normal.     Breath sounds:  Normal breath sounds. No wheezing or rales.  Abdominal:     General: Bowel sounds are normal.     Palpations: Abdomen is soft.     Tenderness: There is no right CVA tenderness or left CVA tenderness.     Comments: RLQ pain and suprapubic pain with guarding. No rebound. +Mcburney's. No obvious Rovsing's, obturator, psoas sign.   Skin:    General: Skin is warm and dry.  Neurological:     Mental Status: She is alert and oriented to person, place, and time.  Psychiatric:        Behavior: Behavior normal.        Judgment: Judgment normal.      UC Treatments / Results  Labs (all labs ordered are listed, but only abnormal results are displayed) Labs Reviewed  POCT URINALYSIS DIP (DEVICE) - Abnormal; Notable for the following components:      Result Value   Glucose, UA 100 (*)    Hgb urine dipstick TRACE (*)    All other components within normal limits    EKG None  Radiology No results found.  Procedures Procedures (including critical care time)  Medications Ordered in UC Medications - No data to display  Initial Impression / Assessment and Plan / UC Course  I have reviewed the triage vital signs and the nursing notes.  Pertinent labs & imaging results that were available during my care of the patient were reviewed by me and considered in my medical decision making (see chart for details).    58 year old female comes in for 2-day history of right lower quadrant abdominal pain.  She has had nausea with one episode of nonbilious nonbloody emesis.  Has not tried oral intake today.  She has had one episode of subjective fever.  She showed trace blood in urine without leukocytes or nitrates.  On exam, she has suprapubic and right lower quadrant tenderness with guarding.  No obvious rebound.  Given history and exam, cannot rule out appendicitis, will discharge in stable condition to the emergency department for further evaluation and management needed.  Final Clinical Impressions(s)  / UC Diagnoses   Final diagnoses:  RLQ abdominal pain   ED Prescriptions    None        Ok Edwards, PA-C 11/21/18 1043

## 2018-11-21 NOTE — Interval H&P Note (Signed)
History and Physical Interval Note:  11/21/2018 5:59 PM  Deborah Sampson  has presented today for surgery, with the diagnosis of appendicitis.  The various methods of treatment have been discussed with the patient and family. After consideration of risks, benefits and other options for treatment, the patient has consented to  Procedure(s): APPENDECTOMY LAPAROSCOPIC (N/A) as a surgical intervention.  The patient's history has been reviewed, patient examined, no change in status, stable for surgery.  I have reviewed the patient's chart and labs.  Questions were answered to the patient's satisfaction.     Rolm Bookbinder

## 2018-11-21 NOTE — ED Triage Notes (Signed)
Pt sent her from UC to r/o appendicitis. She reports abdominal pain on the RLQ x2 days.

## 2018-11-21 NOTE — Transfer of Care (Signed)
Immediate Anesthesia Transfer of Care Note  Patient: Deborah Sampson  Procedure(s) Performed: APPENDECTOMY LAPAROSCOPIC (N/A Abdomen)  Patient Location: PACU  Anesthesia Type:General  Level of Consciousness: drowsy and patient cooperative  Airway & Oxygen Therapy: Patient Spontanous Breathing and Patient connected to face mask oxygen  Post-op Assessment: Report given to RN and Post -op Vital signs reviewed and stable  Post vital signs: Reviewed and stable  Last Vitals:  Vitals Value Taken Time  BP 126/82 11/21/2018  7:24 PM  Temp    Pulse 94 11/21/2018  7:25 PM  Resp 20 11/21/2018  7:25 PM  SpO2 100 % 11/21/2018  7:25 PM  Vitals shown include unvalidated device data.  Last Pain:  Vitals:   11/21/18 1614  TempSrc:   PainSc: 7          Complications: No apparent anesthesia complications

## 2018-11-21 NOTE — ED Notes (Signed)
Per interpreter pt has RLQ pain and difficulty having bowel movement and reports pain with that. She reports some difficulty urinating yesterday that has resolved.

## 2018-11-21 NOTE — Op Note (Signed)
Preoperative diagnosis: Acute appendicitis Postoperative diagnosis: Same as above Procedure: Laparoscopic appendectomy Surgeon: Dr. Serita Grammes Anesthesia: General Specimens: Appendix to pathology Estimated blood loss: 10 cc Complications: None Drains: None Sponge needle count correct at completion Disposition to recovery stable  Indications: This a 58 year old female who presents with less than 24 hours of right lower quadrant pain, elevated white blood cell count, right lower quadrant tenderness and a CT scan confirmed appendicitis.  I discussed via an interpreter going to the operating room for laparoscopic appendectomy.  Procedure: After informed consent was obtained the patient was taken to the operating room.  She was given antibiotics.  SCDs were in place.  She was placed under general anesthesia without complication.  A Foley catheter was placed.  She was prepped and draped in the standard sterile surgical fashion.  A surgical timeout was then performed.  I filtrated Marcaine below her umbilicus.  I made a vertical incision.  I grasped the fascia with a Kocher clamp and incised this.  I entered the peritoneum bluntly.  I then placed a 0 Vicryl pursestring suture through the fascia.  I inserted a Hassan trocar and insufflated the abdomen to 15 mmHg pressure.  I then inserted 2 further 5 mm trochars in the suprapubic region and left lower quadrant under direct vision after infiltration with local anesthetic.  I then was able to view the appendix.  I remove the omentum off of it.  She had acute suppurative appendicitis that was not perforated.  I used a harmonic scalpel to take down the appendiceal mesentery.  I also took down the white line of Toldt as there was some retroperitoneal attachments.  I then was able to identify the base.  The base was viable although was dilated.  I then used the GIA stapler to divide the appendix at the base.  I placed it in a bag and removed it from the  abdomen.  Hemostasis was observed.  I then remove the [facility] trocar.  I then tied down my pursestring.  I placed 2 additional 0 Vicryl sutures through this to completely obliterate this defect.  I then desufflated the abdomen and remove the remaining trocars.  I then closed these with 4-0 Monocryl, glue, and Steri-Strips.  She tolerated this well was extubated and transferred to recovery stable.

## 2018-11-21 NOTE — ED Provider Notes (Signed)
[place] EMERGENCY DEPARTMENT Provider Note   CSN: [id number] Arrival date & time: 11/21/18  1053    History   Chief Complaint Chief Complaint  Patient presents with   Abdominal Pain    R/O APPENDICITIS    HPI Deborah Sampson is a 59 y.o. female.   Level 5 caveat secondary to arrival.  HPI  58 year old female presents today from home via urgent care.  She began having right lower quadrant pain yesterday evening.  She complains of pain with attempting to have a bowel movement feels like she cannot stool.  She has had some nausea but no vomiting.  She has had decreased p.o. intake with juice only this morning.  She describes the pain as 8 out of 10.  It is worse with movement and palpation.  She has taken no medications for this.  She denies any UTI symptoms.  Her last menstrual cycle was several months ago.  She has not had cough, chest pain, dyspnea, fever, or chills.  She denies any exposure to anybody with COVID-19 or any travel recently.  History reviewed. No pertinent past medical history.  Patient Active Problem List   Diagnosis Date Noted   Hypopigmentation 03/15/2017   Headache 12/15/2016   Constipation 01/24/2016   Refugee health examination 11/27/2015   Back pain 11/27/2015   Palpitations 11/27/2015   Scars 11/27/2015    History reviewed. No pertinent surgical history.   OB History   No obstetric history on file.      Home Medications    Prior to Admission medications   Medication Sig Start Date End Date Taking? Authorizing Provider  acetaminophen (TYLENOL) 500 MG tablet Take 1 tablet (500 mg total) by mouth every 6 (six) hours as needed. 11/02/18   Wurst, [place], PA-C  fluticasone (FLONASE) 50 MCG/ACT nasal spray Place 2 sprays into both nostrils daily. 11/02/18   Wurst, [place], PA-C  ibuprofen (ADVIL,MOTRIN) 400 MG tablet Take 1 tablet (400 mg total) by mouth every 8 (eight) hours as needed for headache. 11/12/17   Diallo,  Earna Coder, MD  naproxen (NAPROSYN) 500 MG tablet Take 1 tablet (500 mg total) by mouth 2 (two) times daily. 11/01/18   Alveda Reasons, MD  polyethylene glycol [facility] / Floria Raveling) packet Take 17 g by mouth daily. 11/12/17   Diallo, Earna Coder, MD  psyllium (METAMUCIL) 0.52 g capsule Take 1 capsule (0.52 g total) by mouth daily. 11/12/17   Diallo, Earna Coder, MD  triamcinolone cream (KENALOG) 0.1 % Apply 1 application topically 2 (two) times daily. Dispense qs x4 weeks 04/23/17   Alveda Reasons, MD    Family History Family History  Family history unknown: Yes    Social History Social History   Tobacco Use   Smoking status: Never Smoker   Smokeless tobacco: Never Used  Substance Use Topics   Alcohol use: No   Drug use: No     Allergies   Penicillins   Review of Systems Review of Systems  All other systems reviewed and are negative.    Physical Exam Updated Vital Signs BP 110/68 (BP Location: Right Arm)    Pulse 95    Temp 99.1 F (37.3 C) (Oral)    Resp 18    SpO2 98%   Physical Exam Vitals signs and nursing note reviewed.  Constitutional:      General: She is not in acute distress.    Appearance: She is well-developed and normal weight.  HENT:     Head: Normocephalic.  Mouth/Throat:     Mouth: Mucous membranes are moist.  Eyes:     Extraocular Movements: Extraocular movements intact.  Cardiovascular:     Rate and Rhythm: Normal rate and regular rhythm.     Heart sounds: Normal heart sounds.  Abdominal:     General: Abdomen is flat.     Palpations: Abdomen is soft.     Tenderness: There is abdominal tenderness in the right lower quadrant.  Skin:    General: Skin is warm and dry.     Capillary Refill: Capillary refill takes less than 2 seconds.  Neurological:     General: No focal deficit present.     Mental Status: She is alert. She is disoriented.  Psychiatric:        Mood and Affect: Mood normal.      ED Treatments / Results  Labs (all  labs ordered are listed, but only abnormal results are displayed) Labs Reviewed  COMPREHENSIVE METABOLIC PANEL - Abnormal; Notable for the following components:      Result Value   Glucose, Bld 115 (*)    All other components within normal limits  CBC - Abnormal; Notable for the following components:   WBC 13.9 (*)    All other components within normal limits  URINALYSIS, ROUTINE W REFLEX MICROSCOPIC - Abnormal; Notable for the following components:   Hgb urine dipstick SMALL (*)    Leukocytes,Ua TRACE (*)    All other components within normal limits  SARS CORONAVIRUS 2 (HOSPITAL ORDER, [place] [facility])  LIPASE, BLOOD  I-STAT BETA HCG BLOOD, ED ([facility], [facility], [facility] ONLY)    EKG None  Radiology Ct Abdomen Pelvis W Contrast  Result Date: 11/21/2018 CLINICAL DATA:  Right lower quadrant abdominal pain. EXAM: CT ABDOMEN AND PELVIS WITH CONTRAST TECHNIQUE: Multidetector CT imaging of the abdomen and pelvis was performed using the standard protocol following bolus administration of intravenous contrast. CONTRAST:  19mL OMNIPAQUE IOHEXOL 300 MG/ML  SOLN COMPARISON:  CT scan dated 12/22/2016 FINDINGS: Lower chest: Minimal linear scarring at the left lung base. Otherwise normal. Hepatobiliary: No focal liver abnormality is seen. No gallstones, gallbladder wall thickening, or biliary dilatation. Pancreas: Unremarkable. No pancreatic ductal dilatation or surrounding inflammatory changes. Spleen: Normal in size without focal abnormality. Adrenals/Urinary Tract: Adrenal glands are normal. Fourteen and 13 mm simple appearing cyst in the upper pole of the otherwise normal left kidney. 9 mm cyst in the mid right kidney. The right kidney is otherwise normal. No hydronephrosis. Bladder appears normal. Stomach/Bowel: The appendix is dilated to 14 mm with periappendiceal inflammation. The stomach and small bowel and the remainder of the colon are normal. Vascular/Lymphatic: No significant vascular  findings are present. No enlarged abdominal or pelvic lymph nodes. Reproductive: Uterus and bilateral adnexa are unremarkable. Other: No abdominal wall hernia or abnormality. No abdominopelvic ascites. Musculoskeletal: No acute abnormality. Moderate degenerative disc disease at L4-5 with a broad-based disc protrusion with hypertrophy of the ligamentum flavum and facet joints creating moderately severe compression of the thecal sac. IMPRESSION: 1. Acute appendicitis. 2. Moderately severe spinal stenosis at L4-5. 3. Critical Value/emergent results were called by telephone at the time of interpretation on 11/21/2018 at 3:32 pm to Mr. Penni Bombard, [place], who verbally acknowledged these results. Electronically Signed   By: Lorriane Shire M.D.   On: 11/21/2018 15:32    Procedures Procedures (including critical care time)  Medications Ordered in ED Medications  sodium chloride flush (NS) 0.9 % injection 3 mL (has no administration  in time range)  cefoTEtan (CEFOTAN) 1 g in sodium chloride 0.9 % 100 mL IVPB (has no administration in time range)  morphine 2 MG/ML injection 2 mg (2 mg Intravenous Given 11/21/18 1440)  iohexol (OMNIPAQUE) 300 MG/ML solution 100 mL (100 mLs Intravenous Contrast Given 11/21/18 1456)     Initial Impression / Assessment and Plan / ED Course  I have reviewed the triage vital signs and the nursing notes.  Pertinent labs & imaging results that were available during my care of the patient were reviewed by me and considered in my medical decision making (see chart for details).       Radiology called diagnosis of appendicitis COVID testing is ordered and pending Patient is n.p.o.  She has had cefotetan 2 g ordered. Discussed with general surgery and will see for admission   Final Clinical Impressions(s) / ED Diagnoses   Final diagnoses:  Acute appendicitis, unspecified acute appendicitis type    ED Discharge Orders    None       Pattricia Boss, MD 11/21/18 1614

## 2018-11-22 ENCOUNTER — Encounter (HOSPITAL_COMMUNITY): Payer: Self-pay | Admitting: General Surgery

## 2018-11-22 NOTE — Progress Notes (Signed)
Pt discharged home last night into care of husband. Discharge instructions reviewed at length via [employer] interpreter. Pt's husband was listening on the phone to discharge instructions. Both patient and her husband verbalized understanding of instructions through interpreter. Patient indicated that she has a friend   who speaks [place] and can help her make appointment with surgeon's office. Pt asked for a note for her employer and I told her through the interpreter that she should request that when she makes the appointment.

## 2018-11-22 NOTE — Discharge Summary (Signed)
[facility] [place] [facility] Discharge Summary   Patient ID: Deborah Sampson MRN: [id number] DOB/AGE: 1961/06/12 58 y.o.  Admit date: 11/21/2018 Discharge date: 11/21/2018  Admitting Diagnosis: Acute appendicitis   Discharge Diagnosis Patient Active Problem List   Diagnosis Date Noted  . Acute appendicitis 11/21/2018  . S/P laparoscopic appendectomy 11/21/2018  . Hypopigmentation 03/15/2017  . Headache 12/15/2016  . Constipation 01/24/2016  . Refugee health examination 11/27/2015  . Back pain 11/27/2015  . Palpitations 11/27/2015  . Scars 11/27/2015    Consultants None  Imaging: Ct Abdomen Pelvis W Contrast  Result Date: 11/21/2018 CLINICAL DATA:  Right lower quadrant abdominal pain. EXAM: CT ABDOMEN AND PELVIS WITH CONTRAST TECHNIQUE: Multidetector CT imaging of the abdomen and pelvis was performed using the standard protocol following bolus administration of intravenous contrast. CONTRAST:  153mL OMNIPAQUE IOHEXOL 300 MG/ML  SOLN COMPARISON:  CT scan dated 12/22/2016 FINDINGS: Lower chest: Minimal linear scarring at the left lung base. Otherwise normal. Hepatobiliary: No focal liver abnormality is seen. No gallstones, gallbladder wall thickening, or biliary dilatation. Pancreas: Unremarkable. No pancreatic ductal dilatation or surrounding inflammatory changes. Spleen: Normal in size without focal abnormality. Adrenals/Urinary Tract: Adrenal glands are normal. Fourteen and 13 mm simple appearing cyst in the upper pole of the otherwise normal left kidney. 9 mm cyst in the mid right kidney. The right kidney is otherwise normal. No hydronephrosis. Bladder appears normal. Stomach/Bowel: The appendix is dilated to 14 mm with periappendiceal inflammation. The stomach and small bowel and the remainder of the colon are normal. Vascular/Lymphatic: No significant vascular findings are present. No enlarged abdominal or pelvic lymph nodes. Reproductive: Uterus and bilateral adnexa are unremarkable.  Other: No abdominal wall hernia or abnormality. No abdominopelvic ascites. Musculoskeletal: No acute abnormality. Moderate degenerative disc disease at L4-5 with a broad-based disc protrusion with hypertrophy of the ligamentum flavum and facet joints creating moderately severe compression of the thecal sac. IMPRESSION: 1. Acute appendicitis. 2. Moderately severe spinal stenosis at L4-5. 3. Critical Value/emergent results were called by telephone at the time of interpretation on 11/21/2018 at 3:32 pm to Mr. Penni Bombard, [place], who verbally acknowledged these results. Electronically Signed   By: Lorriane Shire M.D.   On: 11/21/2018 15:32    Procedures Dr. Donne Hazel (11/21/18) - Laparoscopic Appendectomy  Hospital Course:  Deborah Sampson is a 58yo female who presented to [facility] 5[facility] with 1 day of abdominal pain and nausea.  Workup showed acute appendicitis.  Patient was admitted and underwent procedure listed above.  Tolerated procedure well.  On POD0, the patient was voiding well, tolerating diet, ambulating well, pain well controlled, vital signs stable, incisions c/d/i and felt stable for discharge home.  Patient will follow up as below and knows to call with questions or concerns.     Allergies as of 11/22/2018      Reactions   Penicillins Rash      Medication List    TAKE these medications   acetaminophen 500 MG tablet Commonly known as:  TYLENOL Take 1 tablet (500 mg total) by mouth every 6 (six) hours as needed.   fluticasone 50 MCG/ACT nasal spray Commonly known as:  FLONASE Place 2 sprays into both nostrils daily.   ibuprofen 400 MG tablet Commonly known as:  ADVIL Take 1 tablet (400 mg total) by mouth every 8 (eight) hours as needed for headache.   naproxen 500 MG tablet Commonly known as:  NAPROSYN Take 1 tablet (500 mg total) by mouth 2 (two) times daily.   oxyCODONE 5 MG  immediate release tablet Commonly known as:  Oxy IR/ROXICODONE Take 1 tablet (5 mg total) by mouth every 4 (four)  hours as needed for moderate pain.   polyethylene glycol 17 g packet Commonly known as:  MIRALAX / GLYCOLAX Take 17 g by mouth daily.   psyllium 0.52 g capsule Commonly known as:  Metamucil Take 1 capsule (0.52 g total) by mouth daily.   triamcinolone cream 0.1 % Commonly known as:  KENALOG Apply 1 application topically 2 (two) times daily. Dispense qs x4 weeks        Follow-up [facility] In 3 weeks.   Specialty:  General Surgery Contact information: [address] [place] [phone]          Signed: Wellington Hampshire, [facility] 11/22/2018, 10:50 AM Pager: [phone] Mon-Thurs 7:00 am-4:30 pm Fri 7:00 am -11:30 AM Sat-Sun 7:00 am-11:30 am

## 2018-12-05 ENCOUNTER — Other Ambulatory Visit: Payer: Self-pay | Admitting: Family Medicine

## 2018-12-19 ENCOUNTER — Ambulatory Visit (INDEPENDENT_AMBULATORY_CARE_PROVIDER_SITE_OTHER): Payer: BLUE CROSS/BLUE SHIELD | Admitting: Family Medicine

## 2018-12-19 ENCOUNTER — Other Ambulatory Visit: Payer: Self-pay

## 2018-12-19 VITALS — BP 118/60 | HR 69 | Temp 98.0°F | Wt 164.0 lb

## 2018-12-19 DIAGNOSIS — R1033 Periumbilical pain: Secondary | ICD-10-CM

## 2018-12-19 NOTE — Patient Instructions (Addendum)
Go to [facility] surgery at 1:15pm. Their address is [address], [place], [place] [address]. They are going to check on your abdominal pain and see when to do about this.   Lamount Cohen muri Devol yo hagati saa 1h15. Aderesi yabo ni [address] [place], [place], [place] [address]. Bagiye gusuzuma ububabare bwo munda urebe icyo wakora kuri ibi.

## 2018-12-19 NOTE — Progress Notes (Signed)
   CC: abdominal pain   HPI  [place] interpreter used. Unfortunately patient often did not answer the question that was asked, wonder if the interpretation was accurate.   Recent sx - lap appy on 5/4. She still has pain and was told to go back to work on 6/2. Pain feels like around the incision. She feels weak all over her body. For work, she works at the [employer] she [employer], she lifts plates about 8kg. They did not call her surgeon. No fever. BM daily, and she takes miralax for this. For pain, she has tried the oxy, which helped, and now she has finished this. She has not taken tylenol or used any medications for pain the last few days. She feels the pain more when she is seated, no drainage from incision. She does feel more pain when she moves around. Pain feels more deep than in the incisions. She has less appetite than previous.   ROS: Denies CP, SOB, abdominal pain, dysuria, changes in BMs.   CC, SH/smoking status, and VS noted  Objective: BP 118/60   Pulse 69   Temp 98 F (36.7 C) (Oral)   Wt 164 lb (74.4 kg)   SpO2 98%   BMI 28.15 kg/m  Gen: NAD, alert, cooperative, and pleasant. HEENT: NCAT, EOMI, PERRL CV: RRR, no murmur Resp: CTAB, no wheezes, non-labored Abd: soft, well healed incision clean and dry without drainage. Pain with palpation of bilateral lower quadrants, she uses her hand to grab my hand due to fear of pain. There is fullness below the umbilical incision without fluctuance, redness, or erythema.  Ext: No edema, warm Neuro: Alert and oriented, Speech clear, No gross deficits  Assessment and plan:  Abdominal pain -patient with abdominal pain 4 weeks after lap appendectomy.  She has no fever or chills which makes abscess less likely.  She does have tenderness to bilateral lower quadrants as well as some fullness around the umbilicus.  She has not vomited but has had decreased appetite and has had also regular bowel movements.  The regular bowel  movements and lack of vomiting make SBO less likely.  Differential would include SBO, abscess, or painful scar tissue around the incision which seems the most likely to me. Exam not consistent with peritonitis. Patient due to language barrier did not understand that she was supposed to follow-up with the surgeon.  I called over to [facility] [place] [facility] and spoke to the triage nurse who was able to work the patient in for 130 this afternoon.  Patient was given instructions to go to the emergency room if things change.  Appreciate the help of the surgery team.  If the patient were to re-present to our office without making it to the surgery appointment, as they do have challenges with language barrier although I did give the address of [facility] and showed them on the map where this was, I would consider imaging.  Ralene Ok, MD, PGY3 12/19/2018 10:11 AM

## 2019-03-06 ENCOUNTER — Other Ambulatory Visit: Payer: Self-pay

## 2019-03-06 ENCOUNTER — Telehealth: Payer: BC Managed Care – PPO | Admitting: Family Medicine

## 2019-03-06 NOTE — Progress Notes (Signed)
[place] Telemedicine Visit  Attempted to call patient at phone numbers provided in chart:  [phone], [phone] multiple times with no answer with [facility] interpreters. Believe her contact information may be wrong.  [place] Dashonna Chagnon, DO PGY-3, [facility]

## 2019-03-08 ENCOUNTER — Other Ambulatory Visit: Payer: Self-pay

## 2019-03-08 ENCOUNTER — Ambulatory Visit: Payer: BC Managed Care – PPO | Admitting: Family Medicine

## 2019-03-08 VITALS — BP 110/80 | HR 78 | Wt 170.8 lb

## 2019-03-08 DIAGNOSIS — K219 Gastro-esophageal reflux disease without esophagitis: Secondary | ICD-10-CM | POA: Diagnosis not present

## 2019-03-08 DIAGNOSIS — Z1211 Encounter for screening for malignant neoplasm of colon: Secondary | ICD-10-CM | POA: Diagnosis not present

## 2019-03-08 MED ORDER — OMEPRAZOLE 20 MG PO CPDR: 20.0000 mg | DELAYED_RELEASE_CAPSULE | Freq: Every day | ORAL | 3 refills | Status: DC

## 2019-03-08 NOTE — Assessment & Plan Note (Addendum)
Exam and history consistent with GERD.  Will trial PPI.  Patient given appropriate return precautions, she voiced understanding.  Advised to come back in 1 month or sooner if not improved. -Omeprazole 20 mg daily -Urea breath test today for H. pylori

## 2019-03-08 NOTE — Progress Notes (Signed)
     Subjective: Chief Complaint  Patient presents with  . Abdominal Pain     HPI: Deborah Sampson is a 58 y.o. presenting to clinic today to discuss the following:  1 Abdominal Pain 2 weeks, epigastric radiating in band to upper quadrants, some nausea, no vomiting.  Pain progressively worsening.  Has a history of harder stools in the past, having them now as well.  Last BM this AM, before this was two days ago.  Never had before.  Subjective warmth that "disappears quickly," no fevers.  Pain doesn't worsen with eating.  Happens twice a day, lasts 2 hours.  Notes that bending over worsens the pain and also makes her lower back hurt.  Nothing else worsens pain.  Had appendectomy in May 2020.  Not taking any medication for the pain.  No alleviating factors.  At first the pain will be sharp, then it will become a burning pain.  No chest pain or shortness of breath.  Does note that pain sometimes moves up the center of her chest, but not all the time, mostly the burning pain.  Sometimes feels dry mouth at the beginning.  Pain worsens when she is lying flat.  She does not have pain at present.     ROS noted in HPI.   Past Medical, Surgical, Social, and Family History Reviewed & Updated per EMR.   Pertinent Historical Findings include:   Social History   Tobacco Use  Smoking Status Never Smoker  Smokeless Tobacco Never Used      Objective: BP 110/80   Pulse 78   Wt 170 lb 12.8 oz (77.5 kg)   LMP 09/07/2018   SpO2 99%   BMI 29.32 kg/m  Vitals and nursing notes reviewed  Physical Exam:  General: 58 y.o. female in NAD Cardio: RRR no m/r/g Lungs: CTAB, no wheezing, no rhonchi, no crackles, no IWOB on RA Abdomen: Soft, epigastric tenderness, non-distended, positive bowel sounds Skin: warm and dry Extremities: No edema   No results found for this or any previous visit (from the past 72 hour(s)).  Assessment/Plan:  Gastroesophageal reflux disease Exam and history consistent  with GERD.  Will trial PPI.  Patient given appropriate return precautions, she voiced understanding.  Advised to come back in 1 month or sooner if not improved. -Omeprazole 20 mg daily -Urea breath test today for H. pylori  Screening for malignant neoplasm of colon Patient has never had a colonoscopy, will refer to GI for this.  Patient given information on how to schedule colonoscopy.     PATIENT EDUCATION PROVIDED: See AVS    Diagnosis and plan along with any newly prescribed medication(s) were discussed in detail with this patient today. The patient verbalized understanding and agreed with the plan. Patient advised if symptoms worsen return to clinic or ER.     Orders Placed This Encounter  Procedures  . H. pylori breath test  . Ambulatory referral to Gastroenterology    Referral Priority:   Routine    Referral Type:   Consultation    Referral Reason:   Specialty Services Required    Number of Visits Requested:   1    Meds ordered this encounter  Medications  . omeprazole (PRILOSEC) 20 MG capsule    Sig: Take 1 capsule (20 mg total) by mouth daily.    Dispense:  30 capsule    Refill:  [place], DO 03/08/2019, 1:53 PM PGY-2 [place]

## 2019-03-08 NOTE — Patient Instructions (Signed)
Thank you for coming to see me today. It was a pleasure. Today we talked about:   Your abdominal pain: Your pain is caused by acid reflux.  You should take this medication once a day.  We will test you for an infection that can cause this.  You should also see a stomach doctor for a colonoscopy.  Please follow-up with Dr. Nori Riis in 1 month if no improvement.  If you have any questions or concerns, please do not hesitate to call the office at [phone].  Best,   [place] Constable, DO

## 2019-03-08 NOTE — Assessment & Plan Note (Signed)
Patient has never had a colonoscopy, will refer to GI for this.  Patient given information on how to schedule colonoscopy.

## 2019-03-10 LAB — H. PYLORI BREATH TEST: H pylori Breath Test: POSITIVE — AB

## 2019-04-11 ENCOUNTER — Encounter: Payer: Self-pay | Admitting: Family Medicine

## 2019-05-26 ENCOUNTER — Encounter (HOSPITAL_COMMUNITY): Payer: Self-pay

## 2019-05-26 ENCOUNTER — Other Ambulatory Visit: Payer: Self-pay

## 2019-05-26 ENCOUNTER — Ambulatory Visit (HOSPITAL_COMMUNITY)
Admission: EM | Admit: 2019-05-26 | Discharge: 2019-05-26 | Disposition: A | Discharge status: Home / Self Care | Payer: BC Managed Care – PPO | Attending: Family Medicine | Admitting: Family Medicine

## 2019-05-26 ENCOUNTER — Telehealth (INDEPENDENT_AMBULATORY_CARE_PROVIDER_SITE_OTHER): Payer: BC Managed Care – PPO | Admitting: Family Medicine

## 2019-05-26 DIAGNOSIS — A048 Other specified bacterial intestinal infections: Secondary | ICD-10-CM | POA: Diagnosis not present

## 2019-05-26 MED ORDER — OMEPRAZOLE 20 MG PO CPDR: 20.0000 mg | DELAYED_RELEASE_CAPSULE | Freq: Two times a day (BID) | ORAL | 0 refills | Status: DC

## 2019-05-26 MED ORDER — METRONIDAZOLE 500 MG PO TABS: 500.0000 mg | ORAL_TABLET | Freq: Two times a day (BID) | ORAL | 0 refills | Status: AC

## 2019-05-26 MED ORDER — CLARITHROMYCIN 500 MG PO TABS: 500.0000 mg | ORAL_TABLET | Freq: Two times a day (BID) | ORAL | 0 refills | Status: AC

## 2019-05-26 NOTE — Progress Notes (Deleted)
[place] Telemedicine Visit  Patient consented to have virtual visit. Method of visit: Telephone   Encounter participants: Patient: Deborah Sampson - located at *** Provider: Carollee Leitz - located at home Others (if applicable): [employer]  Chief Complaint: ***  HPI: Phone call at 1101 no answer ***  ROS: per HPI  Pertinent PMHx: ***  Exam:  Respiratory: ***  Assessment/Plan:  No problem-specific Assessment & Plan notes found for this encounter.    Time spent during visit with patient: *** minutes

## 2019-05-26 NOTE — ED Triage Notes (Addendum)
Patient presents to Urgent Care with complaints of abdominal pain and headache since 2 days ago. Patient reports the pain keeps getting worse.  Patient tried to see provider at different office but states she only talked to the lady at the front desk. Pt was called by provider later to discuss care via telephone, pt did not answer and message was left instructing pt to pick up medication to treat H. Pylori that was untreated in 02/2019. Patient stated to this RN that because of the language barrier she does not know how to check her voicemail and did not receive that message. [place] interpreter used for triage.

## 2019-05-26 NOTE — Progress Notes (Addendum)
Patient scheduled a virtual office appointment. Using [employer],  [place] at 1101 am,1110  am x2, 1115am and no answer.  Messages left at each attempt.  Last message left that patient would need to reschedule appointment or if symptoms worsen to seek medical attention as soon as possible.  Of note, on chart review patient tested positive for H.Pylori 02/2019 but I see no treatment.  I will send script to pharm for pt pick up.  Omprazole 20 mg BID x 10 days Biaxin 500 mg BID x 10 days Flagyl 500 mg BID x 10 days  Patient allergic to penicillins (rash) so unable to prescribe Amoxicillin   Follow up with PCP  Carollee Leitz MD

## 2019-05-26 NOTE — ED Provider Notes (Signed)
[facility]    CSN: [id number] Arrival date & time: 05/26/19  1518      History   Chief Complaint Chief Complaint  Patient presents with  . Abdominal Pain    HPI Deborah Sampson is a 58 y.o. female.   58 yo established [place] patient from [place].  Patient is complaining of RUQ and epigastric pain.  She had appendicitis last May.  Because of language barrier, patient did not realize her doctor has called in H. Pylori meds for her.  No nausea, diarrhea, vomiting, fever     History reviewed. No pertinent past medical history.  Patient Active Problem List   Diagnosis Date Noted  . Gastroesophageal reflux disease 03/08/2019  . Screening for malignant neoplasm of colon 03/08/2019  . Acute appendicitis 11/21/2018  . S/P laparoscopic appendectomy 11/21/2018  . Hypopigmentation 03/15/2017  . Headache 12/15/2016  . Constipation 01/24/2016  . Refugee health examination 11/27/2015  . Back pain 11/27/2015  . Palpitations 11/27/2015  . Scars 11/27/2015    Past Surgical History:  Procedure Laterality Date  . LAPAROSCOPIC APPENDECTOMY N/A 11/21/2018   Procedure: APPENDECTOMY LAPAROSCOPIC;  Surgeon: Rolm Bookbinder, MD;  Location: [facility];  Service: General;  Laterality: N/A;    OB History   No obstetric history on file.      Home Medications    Prior to Admission medications   Medication Sig Start Date End Date Taking? Authorizing Provider  acetaminophen (TYLENOL) 500 MG tablet Take 1 tablet (500 mg total) by mouth every 6 (six) hours as needed. 11/02/18   Wurst, [place], PA-C  clarithromycin (BIAXIN) 500 MG tablet Take 1 tablet (500 mg total) by mouth 2 (two) times daily for 10 days. 05/26/19 06/05/19  Carollee Leitz, MD  fluticasone (FLONASE) 50 MCG/ACT nasal spray Place 2 sprays into both nostrils daily. 11/02/18   Wurst, [place], PA-C  ibuprofen (ADVIL,MOTRIN) 400 MG tablet Take 1 tablet (400 mg total) by mouth every 8 (eight) hours as needed for headache.  11/12/17   Diallo, Earna Coder, MD  metroNIDAZOLE (FLAGYL) 500 MG tablet Take 1 tablet (500 mg total) by mouth 2 (two) times daily for 10 days. 05/26/19 06/05/19  Carollee Leitz, MD  naproxen (NAPROSYN) 500 MG tablet TAKE 1 TABLET BY MOUTH TWICE DAILY 12/06/18   Alveda Reasons, MD  omeprazole (PRILOSEC) 20 MG capsule Take 1 capsule (20 mg total) by mouth daily. 03/08/19   Meccariello, Bernita Raisin, DO  omeprazole (PRILOSEC) 20 MG capsule Take 1 capsule (20 mg total) by mouth 2 (two) times daily for 10 days. 05/26/19 06/05/19  Carollee Leitz, MD  polyethylene glycol [facility] Floria Raveling) packet Take 17 g by mouth daily. 11/12/17   Diallo, Earna Coder, MD  psyllium (METAMUCIL) 0.52 g capsule Take 1 capsule (0.52 g total) by mouth daily. 11/12/17   Diallo, Earna Coder, MD  triamcinolone cream (KENALOG) 0.1 % Apply 1 application topically 2 (two) times daily. Dispense qs x4 weeks 04/23/17   Alveda Reasons, MD    Family History Family History  Family history unknown: Yes    Social History Social History   Tobacco Use  . Smoking status: Never Smoker  . Smokeless tobacco: Never Used  Substance Use Topics  . Alcohol use: No  . Drug use: No     Allergies   Penicillins   Review of Systems Review of Systems  Constitutional: Negative.   Gastrointestinal: Positive for abdominal pain.  All other systems reviewed and are negative.    Physical Exam Triage Vital Signs  ED Triage Vitals  Enc Vitals Group     BP      Pulse      Resp      Temp      Temp src      SpO2      Weight      Height      Head Circumference      Peak Flow      Pain Score      Pain Loc      Pain Edu?      Excl. in [place]?    No data found.  Updated Vital Signs BP 131/80 (BP Location: Right Arm)   Pulse 74   Temp 98.2 F (36.8 C) (Oral)   Resp 16   SpO2 100%    Physical Exam Vitals signs and nursing note reviewed.  Constitutional:      Appearance: She is well-developed and normal weight.  HENT:     Head:  Normocephalic.  Pulmonary:     Effort: Pulmonary effort is normal.  Abdominal:     General: Abdomen is flat. Bowel sounds are normal.     Palpations: Abdomen is soft.     Tenderness: There is abdominal tenderness in the epigastric area.  Skin:    General: Skin is warm and dry.  Neurological:     General: No focal deficit present.     Mental Status: She is alert.  Psychiatric:        Mood and Affect: Mood is anxious.      UC Treatments / Results  Labs (all labs ordered are listed, but only abnormal results are displayed) Labs Reviewed - No data to display  EKG   Radiology No results found.  Procedures Procedures (including critical care time)  Medications Ordered in UC Medications - No data to display  Initial Impression / Assessment and Plan / UC Course  I have reviewed the triage vital signs and the nursing notes.  Pertinent labs & imaging results that were available during my care of the patient were reviewed by me and considered in my medical decision making (see chart for details).    Final Clinical Impressions(s) / UC Diagnoses   Final diagnoses:  H. pylori infection   Discharge Instructions   None    ED Prescriptions    None     I have reviewed the PDMP during this encounter.   Robyn Haber, MD 05/26/19 1610

## 2019-05-31 ENCOUNTER — Telehealth: Payer: Self-pay | Admitting: *Deleted

## 2019-05-31 NOTE — Telephone Encounter (Signed)
Contacted pharmacy to see if pt had picked up the Rx's mentioned in the note below from Dr. Volanda Napoleon and they confirmed that they were picked up on 05/26/2019. Deborah Sampson, CMA   Carollee Leitz, MD Yesterday (9:18 AM)     Patient scheduled a virtual office appointment. Using [employer],  [place] at 1101 am,1110  am x2, 1115am and no answer.  Messages left at each attempt.  Last message left that patient would need to reschedule appointment or if symptoms worsen to seek medical attention as soon as possible.  Of note, on chart review patient tested positive for H.Pylori 02/2019 but I see no treatment.  I will send script to pharm for pt pick up.  Omprazole 20 mg BID x 10 days Biaxin 500 mg BID x 10 days Flagyl 500 mg BID x 10 days  Patient allergic to penicillins (rash) so unable to prescribe Amoxicillin   Follow up with PCP  Carollee Leitz MD         Documentation     Carollee Leitz, MD  You 5 days ago   [facility] Deborah, can you please call this patient and tell her that I have sent in a prescription for her. She tested positive for H Pylori in August 2020 and needs to be treated. She will need to take all medication for 10 days total. It will be two antibiotics and a ppi.   So in summary she will need to take Clarithromycin 500 mg twice a day, Flagyl 500 mg twice a day and Omeprazole 20 mg twice a day. All for 10 days. Unfortunately I could not connect with her for her appointment today. She will need follow up with her PCP one week after she completes her medication.   Thank you  Lavella Lemons   Routing comment

## 2019-07-18 ENCOUNTER — Other Ambulatory Visit: Payer: Self-pay

## 2019-07-18 ENCOUNTER — Telehealth (INDEPENDENT_AMBULATORY_CARE_PROVIDER_SITE_OTHER): Payer: BC Managed Care – PPO | Admitting: Family Medicine

## 2019-07-18 NOTE — Progress Notes (Signed)
Attempted to call; however, no answer. See Michelle's note.

## 2019-07-18 NOTE — Progress Notes (Signed)
Attempted to call patient for virtual visit with [place] interpreter [place] ID # [phone].  No answer at any of the numbers in patient's chart.  Ozella Almond, [place]

## 2019-07-24 ENCOUNTER — Ambulatory Visit: Payer: BC Managed Care – PPO

## 2019-07-24 ENCOUNTER — Telehealth: Payer: BC Managed Care – PPO | Admitting: Family Medicine

## 2019-07-24 NOTE — Progress Notes (Deleted)
[place] Telemedicine Visit  Patient consented to have virtual visit. Method of visit: {TELEPHONE VS [id number]  Encounter participants: Patient: Deborah Sampson - located at home  Provider: Stark Klein - located at *** Others (if applicable): ***  Chief Complaint: was unable to reach paient at her provided number or via her spouse's provided number  HPI:  ***  ROS: per HPI  Pertinent PMHx: ***  Exam:  Respiratory: ***  Assessment/Plan:  No problem-specific Assessment & Plan notes found for this encounter.    Time spent during visit with patient: *** minutes

## 2019-07-25 ENCOUNTER — Ambulatory Visit (INDEPENDENT_AMBULATORY_CARE_PROVIDER_SITE_OTHER): Payer: BC Managed Care – PPO | Admitting: Family Medicine

## 2019-07-25 ENCOUNTER — Ambulatory Visit: Payer: Self-pay | Admitting: Pharmacist

## 2019-07-25 ENCOUNTER — Encounter: Payer: Self-pay | Admitting: Family Medicine

## 2019-07-25 ENCOUNTER — Other Ambulatory Visit: Payer: Self-pay

## 2019-07-25 ENCOUNTER — Telehealth: Payer: Self-pay | Admitting: Family Medicine

## 2019-07-25 VITALS — BP 110/70 | HR 71 | Wt 173.0 lb

## 2019-07-25 DIAGNOSIS — R002 Palpitations: Secondary | ICD-10-CM

## 2019-07-25 DIAGNOSIS — K5900 Constipation, unspecified: Secondary | ICD-10-CM | POA: Diagnosis not present

## 2019-07-25 DIAGNOSIS — K5909 Other constipation: Secondary | ICD-10-CM

## 2019-07-25 DIAGNOSIS — R1084 Generalized abdominal pain: Secondary | ICD-10-CM | POA: Diagnosis not present

## 2019-07-25 DIAGNOSIS — Z139 Encounter for screening, unspecified: Secondary | ICD-10-CM

## 2019-07-25 DIAGNOSIS — Z789 Other specified health status: Secondary | ICD-10-CM

## 2019-07-25 DIAGNOSIS — K279 Peptic ulcer, site unspecified, unspecified as acute or chronic, without hemorrhage or perforation: Secondary | ICD-10-CM

## 2019-07-25 DIAGNOSIS — A048 Other specified bacterial intestinal infections: Secondary | ICD-10-CM

## 2019-07-25 DIAGNOSIS — N950 Postmenopausal bleeding: Secondary | ICD-10-CM | POA: Insufficient documentation

## 2019-07-25 DIAGNOSIS — D219 Benign neoplasm of connective and other soft tissue, unspecified: Secondary | ICD-10-CM | POA: Insufficient documentation

## 2019-07-25 HISTORY — DX: Other specified health status: Z78.9

## 2019-07-25 HISTORY — DX: Other specified bacterial intestinal infections: A04.8

## 2019-07-25 MED ORDER — CLARITHROMYCIN 500 MG PO TABS: 500.0000 mg | ORAL_TABLET | Freq: Two times a day (BID) | ORAL | 0 refills | Status: DC

## 2019-07-25 MED ORDER — OMEPRAZOLE 20 MG PO CPDR: 20.0000 mg | DELAYED_RELEASE_CAPSULE | Freq: Two times a day (BID) | ORAL | 3 refills | Status: DC

## 2019-07-25 MED ORDER — METRONIDAZOLE 500 MG PO TABS: 500.0000 mg | ORAL_TABLET | Freq: Two times a day (BID) | ORAL | 0 refills | Status: DC

## 2019-07-25 MED ORDER — POLYETHYLENE GLYCOL 3350 17 GM/SCOOP PO POWD: 17.0000 g | Freq: Every day | ORAL | 99 refills | Status: DC

## 2019-07-25 NOTE — Assessment & Plan Note (Signed)
Established problem Uncontrolled  Recommended daily Miralax 1 scoop

## 2019-07-25 NOTE — Assessment & Plan Note (Signed)
Requested Chronic Care Management assistance in helping this non-english speaking patient navigate her treatments and referrals.   In addition, it may be helpful to ask [facility] pharmacy to assist patient in process of obtaining her treatment medications, and taking the medications as directed.

## 2019-07-25 NOTE — Assessment & Plan Note (Addendum)
Established problem Uncontrolled  Two different abd pain patterns; 1) an upper abdominal pain that is of more recent onset (August 2020), and 2) a lower abdominal pain that is over 57 years old and appears related to constipation and/or possibly the source of reported sporadic post-menopausal bleeding.  1) There are no constitutional findings at this point indicating a major disease process, e.g malignant, vascular, inflammatory diseases,  causing the patient's recurrent upper abdominal pains.  Will treat H pylori infection with Triple therapy (Clarithromycin/metronidazole/omeprazole) x 10 days, followed by addition 30 days omeprazole standard dose.   There are findings for both benign and serious causes of the patient's lower pains.  A worrisome finding includes the patient's reported post-menopausal bleeding and  palpable hypogastric fullness that may just represent a benign fibroid(s).  Given the chronicity, constipation, and lower abdominal pain relief with defecation, I suspect this is a functional abdominal pain with constipation. With that said, given her age, Deborah Sampson will likely need a colonoscopic evaluation before relying on this functional explanation.  This GI referral was not made today.    Plan Miralax 17 gram daily to attain soft BM every 1 to 2 days. May need to titrate to effect.

## 2019-07-25 NOTE — Patient Instructions (Signed)
Take 2 Medicines twice a day  for stomach ulcers.  Take for 10 days  Take the powder mixed in water once a day for one year to treat constipation  Go for the Ultrasound test to look for Cancer  Go see the Women's Doctor at our office in 4 weeks

## 2019-07-25 NOTE — Progress Notes (Signed)
Deborah Sampson is accompanied by husband Sources of clinical information for visit is/are patient, spouse/SO and past medical records. Nursing assessment for this office visit was reviewed with the patient for accuracy and revision.   Previous Report(s) Reviewed: ER records, historical medical records, lab reports, office notes and radiology reports  Depression screen PHQ 2/9 07/25/2019  Decreased Interest 0  Down, Depressed, Hopeless 0  PHQ - 2 Score 0    Fall Risk  04/23/2017  Falls in the past year? No    Adult vaccines due  Topic Date Due  . TETANUS/TDAP  11/05/2025    Health Maintenance Due  Topic Date Due  . PAP SMEAR-Modifier  07/21/1983  . MAMMOGRAM  07/20/2012  . COLONOSCOPY  07/20/2012  . INFLUENZA VACCINE  02/18/2019      History/P.E. limitations: communication barrier Language Entire office visit utilized [place] interpreter by phone  Adult vaccines due  Topic Date Due  . TETANUS/TDAP  11/05/2025   There are no preventive care reminders to display for this patient.  Health Maintenance Due  Topic Date Due  . PAP SMEAR-Modifier  07/21/1983  . MAMMOGRAM  07/20/2012  . COLONOSCOPY  07/20/2012  . INFLUENZA VACCINE  02/18/2019     Chief Complaint  Patient presents with  . Abdominal Pain    ABDOMINAL PAIN  Location: two different pains, one is bilateral upper abdomen the other is bilateral lower abdomen  Onset:   Complaint of abdominal pain first noted in [facility] in 11/2015. Patient has had complaint of constipation since that time as well.     09/29/18 [place]: acute abd pain c/o, (+) constipation.  Working dx: pain related to constipation 11/27/15: POC H.Pylori negative  12/21/16 UC/ED visit: CC lower abdominal pain x 2 months Vs new intense pain bilateral lower quadrants cramping, and vomiting, radiation into back.  Last period 3 months ago (Age 59). Doubled over in pain.  CT AP: uterus is bulbous, with probable fibroids. No adnexal masses.   11/02/17 [place]:  CC progressive abd pain x week. TTp x 4Quad. Rx Miralax and metamucil  11/21/18 Hosp admission: Acute appendicitis, Lap appy  12/19/18 [place]: CC abd pain - ttp bil lower quad and around umbilicus. Acute referral to [facility]   03/08/19 [place]: CC Abd pain bil upper quadrants, (+) nausea. Bend over makes worse. No medications. epigastrum ttp. Dx GERD - trial PPI. Urea breath test (+).  Given information on how to schedule colonoscopy.   05/26/19 UC:  CC RUQ and epigastric pain. Secondary to language barrier, pt did not understand that triple H pylori treatm;ent had been sent to pharmacy. H. Pylori treatment sent again.  Deborah Sampson PharmD reported that the triple ten day course prescription was dispensed, but patient and husband both deny Ms Katy Fitch having picked up nor taken these medications.   TTP epigastrum.  Radiation: no  Quality: lower abdominal crampoing  Pattern: intermittent, daily Course: stable,   Duration: since 2016  Better with: defecation, no change with food, no awakening from sleep Worse with: constipation, lower quadrant pain with vaginal bright red spotting Medications: patient denies taking any medications (Rx or OTC) currently  Symptoms Nausea/Vomiting: no  Diarrhea: no  Constipation: yes  Melena/BRBPR: no  Hematemesis: no  Anorexia: no  Fever/Chills: no, reports sweats  Dysuria: no  Rash: no, chronic hypopigmentation lesions  Wt loss: no, in fact, weight gain over last few years  EtOH use: n NSAIDs/ASA: no  LMP: Reports not having gone more than 3 months  without some vaginal bleeding Vaginal bleeding: yes  Work: 6 days out of 7 driving at [employer] Past Surgeries: C-section, lap appy  FH: No reproductive malignancies in mother or sisters ROS: see HPI (+) palpitations intermittently PHYSICAL EXAM  Vitals:   07/25/19 0859  Weight: 173 lb (78.5 kg)    Wt Readings from Last 3 Encounters:  07/25/19 173 lb (78.5 kg)  03/08/19 170 lb 12.8 oz (77.5 kg)   12/19/18 164 lb (74.4 kg)    VS reviewed GEN: Alert, Cooperative, Groomed, NAD ABDOMEN: (+)BS, soft, grimaces LLQ mod palp, suprapubic mod palp, epigstric mod palp, ND, No HSM, possible fullness palpated hypogastrum (-) [place] sign

## 2019-07-25 NOTE — Progress Notes (Signed)
  Chronic Care Management   Care Coordination Note  07/25/2019 Name: Deborah Sampson MRN: [id number] DOB: 1961-04-02  Per MD request, call placed to [facility] ([place], [place]).  Patient picked up triple therapy for H.pylori on 05/26/2019 (clarithromycin, metronidazole, PPI BID).   SIGNATURE Regina Eck, PharmD, BCPS Clinical Pharmacist, [facility] [place]: [phone]

## 2019-07-25 NOTE — Assessment & Plan Note (Addendum)
New problem Uncertain diagnosis/prognosis Ddx:  Leiyomyoma: CT AP 12/21/16: uterus is bulbous, with probable fibroids. No adnexal masses.  Malignancy/Endometrial Hyperplasia: other than Age, no RF for Endometrial CA Endometrial polyp  cervical cancer Atrophic vaginitis  Plan Pelvic/TVUS Referral to [facility] clinic for consideration of endometrial biopsy and Pap. Patient prefers female physician for reproductive organ examination.  CBC/TSH

## 2019-07-25 NOTE — Chronic Care Management (AMB) (Signed)
  Care Management   Outreach Note  07/25/2019 Name: Deborah Sampson MRN: [id number] DOB: March 02, 1961  Referred by: Dickie La, MD Reason for referral :  Care Management (CM Initial outreach unsuccessful)   An unsuccessful telephone outreach was attempted today. The patient was referred to the case management team by for assistance with care management and care coordination.   Follow Up Plan: The care management team will reach out to the patient again over the next 7 days.   Glenna Durand, LPN Health Advisor, Embedded Care Coordination [place] Care Management ??[email or web address] ??[phone]

## 2019-07-26 LAB — LIPASE: Lipase: 36 U/L (ref 14–72)

## 2019-07-26 LAB — CBC WITH DIFFERENTIAL/PLATELET
Basophils Absolute: 0 10*3/uL (ref 0.0–0.2)
Basos: 1 %
EOS (ABSOLUTE): 0 10*3/uL (ref 0.0–0.4)
Eos: 1 %
Hematocrit: 40.9 % (ref 34.0–46.6)
Hemoglobin: 13.8 g/dL (ref 11.1–15.9)
Immature Grans (Abs): 0 10*3/uL (ref 0.0–0.1)
Immature Granulocytes: 0 %
Lymphocytes Absolute: 1.6 10*3/uL (ref 0.7–3.1)
Lymphs: 42 %
MCH: 28.3 pg (ref 26.6–33.0)
MCHC: 33.7 g/dL (ref 31.5–35.7)
MCV: 84 fL (ref 79–97)
Monocytes Absolute: 0.3 10*3/uL (ref 0.1–0.9)
Monocytes: 8 %
Neutrophils Absolute: 1.8 10*3/uL (ref 1.4–7.0)
Neutrophils: 48 %
Platelets: 309 10*3/uL (ref 150–450)
RBC: 4.87 x10E6/uL (ref 3.77–5.28)
RDW: 12.8 % (ref 11.7–15.4)
WBC: 3.8 10*3/uL (ref 3.4–10.8)

## 2019-07-26 LAB — CMP14+EGFR
ALT: 20 IU/L (ref 0–32)
AST: 22 IU/L (ref 0–40)
Albumin/Globulin Ratio: 1.4 (ref 1.2–2.2)
Albumin: 4.3 g/dL (ref 3.8–4.9)
Alkaline Phosphatase: 96 IU/L (ref 39–117)
BUN/Creatinine Ratio: 19 (ref 9–23)
BUN: 15 mg/dL (ref 6–24)
Bilirubin Total: 0.3 mg/dL (ref 0.0–1.2)
CO2: 25 mmol/L (ref 20–29)
Calcium: 9.8 mg/dL (ref 8.7–10.2)
Chloride: 102 mmol/L (ref 96–106)
Creatinine, Ser: 0.81 mg/dL (ref 0.57–1.00)
GFR calc Af Amer: 93 mL/min/{1.73_m2} (ref >59)
GFR calc non Af Amer: 81 mL/min/{1.73_m2} (ref >59)
Globulin, Total: 3.1 g/dL (ref 1.5–4.5)
Glucose: 97 mg/dL (ref 65–99)
Potassium: 4.1 mmol/L (ref 3.5–5.2)
Sodium: 139 mmol/L (ref 134–144)
Total Protein: 7.4 g/dL (ref 6.0–8.5)

## 2019-07-26 LAB — TSH: TSH: 0.686 u[IU]/mL (ref 0.450–4.500)

## 2019-07-26 LAB — SEDIMENTATION RATE: Sed Rate: 24 mm/hr (ref 0–40)

## 2019-07-31 NOTE — Chronic Care Management (AMB) (Signed)
  Care Management   Note  07/31/2019 Name: Deborah Sampson MRN: [id number] DOB: 1961-06-19  Deborah Sampson is a 59 y.o. year old female who is a primary care patient of Dickie La, MD. I reached out to [facility] by phone today in response to a referral sent by [name] health plan.    [name] was given information about care management services today including:  1. Care management services include personalized support from designated clinical staff supervised by her physician, including individualized plan of care and coordination with other care providers 2. 24/7 contact phone numbers for assistance for urgent and routine care needs. 3. The patient may stop care management services at any time by phone call to the office staff.  Patient agreed to services and verbal consent obtained via interpreter.  Follow up plan: Telephone appointment with care management team member scheduled for:08/02/2019  Glenna Durand, LPN Health Advisor, [place] Management ??[email or web address] ??[phone]

## 2019-08-02 ENCOUNTER — Ambulatory Visit: Payer: BC Managed Care – PPO

## 2019-08-02 ENCOUNTER — Other Ambulatory Visit: Payer: Self-pay

## 2019-08-02 ENCOUNTER — Telehealth: Payer: Self-pay | Admitting: *Deleted

## 2019-08-02 NOTE — Chronic Care Management (AMB) (Signed)
  Care Management   Outreach Note  08/02/2019 Name: Deborah Sampson MRN: [id number] DOB: January 05, 1961  Referred by: Dickie La, MD Reason for referral : Care Coordination (Care Management Set up Appointment)   An unsuccessful telephone outreach was attempted today. The patient was referred to the case management team by for assistance with care management and care coordination.  [place] interpreters called spoke with [facility] (interpreter) and conference call made.  Follow Up Plan: A HIPPA compliant phone message was left for the patient providing contact information and requesting a return call.  The care management team will reach out to the patient again over the next 14 days.   Lazaro Arms RN, BSN, [facility] Care Management Coordinator [place] Phone: [phone] Fax: [phone]

## 2019-08-02 NOTE — Telephone Encounter (Signed)
Letter requested from patient as she took off work today.  She states she never received call.  Letter given for today, registration will check number. Christen Bame, CMA

## 2019-08-03 ENCOUNTER — Ambulatory Visit: Payer: Self-pay | Admitting: Pharmacist

## 2019-08-03 DIAGNOSIS — K279 Peptic ulcer, site unspecified, unspecified as acute or chronic, without hemorrhage or perforation: Secondary | ICD-10-CM

## 2019-08-04 NOTE — Progress Notes (Signed)
  Chronic Care Management   Outreach Note  08/03/2019 Name: Deborah Sampson MRN: [id number] DOB: 09-30-1960  Referred by: Dr. Sherren Mocha McDiarmid Reason for referral : chronic care management-med adherence/compliance  Patient contacted via [facility].  Patient was not available, but message left on voicemail.  Triple therapy for H.pylori was filled at [facility] ([place] on 07/25/19.  Will follow up to ensure compliance when able to speak with this patient  Follow Up Plan: The care management team will reach out to the patient again over the next 5 days.   SIGNATURE Regina Eck, PharmD, BCPS Clinical Pharmacist, [place]: [phone]

## 2019-08-10 ENCOUNTER — Telehealth: Payer: BC Managed Care – PPO

## 2019-08-10 ENCOUNTER — Ambulatory Visit: Payer: BC Managed Care – PPO | Admitting: Family Medicine

## 2019-08-10 ENCOUNTER — Other Ambulatory Visit: Payer: Self-pay

## 2019-08-10 VITALS — BP 126/62 | HR 69 | Wt 176.0 lb

## 2019-08-10 DIAGNOSIS — K279 Peptic ulcer, site unspecified, unspecified as acute or chronic, without hemorrhage or perforation: Secondary | ICD-10-CM

## 2019-08-10 DIAGNOSIS — R1084 Generalized abdominal pain: Secondary | ICD-10-CM

## 2019-08-10 DIAGNOSIS — G8929 Other chronic pain: Secondary | ICD-10-CM

## 2019-08-10 MED ORDER — FAMOTIDINE 20 MG PO TABS: 20.0000 mg | ORAL_TABLET | Freq: Every day | ORAL | 3 refills | Status: DC

## 2019-08-10 NOTE — Assessment & Plan Note (Addendum)
Established problem, worsening.  Her chronic generalized abdominal pain may be multifactorial.  I do think that she has a component of peptic ulcer disease, but could also be aggravated by constipation.  It does not seem as though she is having adequate bowel movements, therefore recommended increasing up to twice a day MiraLAX to have bowel movement every day or at least every other day.  She was advised of this.  Also recommend referral to GI.  Added H2 blocker for further treatment of PUD/GERD.  Reassured that her labs on 1/5 were within normal limits, no evidence of pancreatitis, white blood cell count was within normal limits, therefore doubt active infection.  She was advised of all of this.  Agree with Dr. Vikki Ports that this could also be functional abdominal pain, but would need GI referral for possible scope before this diagnosis could be made.  This picture is also complicated by the fact that patient has had postmenopausal bleeding.  She is scheduled for an endometrial biopsy in the colpo clinic on 2/4.  She was made aware of this as well.  She is able to eat, and does have some bowel movements, therefore doubt that she has a small bowel obstruction following her appendectomy in May 2020, but this could be considered.  Could consider further imaging of the abdomen and pelvis if this does not improve.  Overall picture is also complicated by the fact that patient has poor health literacy and there is a language barrier.  She has follow-up in 6 days with her PCP, which she was advised of.  She was recommended to follow-up for this.  Also recommend that if pain is significantly worse, she is unable to eat or drink, she should be seen in the ED.

## 2019-08-10 NOTE — Progress Notes (Signed)
Subjective: Chief Complaint  Patient presents with  . Abdominal Pain     HPI: Deborah Sampson is a 59 y.o. presenting to clinic today to discuss the following:  1 Abdominal Pain Had acute appendicitis in May 2020 H pylori treatment ordered on visit 1/5, she has completed the 10 day course and is currently finishing the 30 day course of omeprazole to complete triple therapy  Scheduled for Colpo clinic for possible Endometrial Biopsy on 2/4 Coming in for the same pain that she has been having since August, thinks that it is getting worse over the last 2 weeks States that the intensity of the pain has worsened Had labs drawn on 1/5 and is wondering what the results were States that miralax has helped her constipation, but this has not really helped her pain Having BMs, but cannot precisely say how many times a week, but not daily or evem every other day consistently "feels warm and painful inside" Pain is in the mid-abdomen and she also feels pain in the upper back bilaterally Constant pain, but at times worsens in intensity Random timing, nothing makes it worse or come about Nothing makes it better Severe episodes last 2-3 hours Eating does not make the pain worse, but states that she can't eat when she has the pain because "something comes up in my mouth like salty and it makes me unable to eat." Had been referred for a colonoscopy, but has not gone for this In September had post-menopausal bleeding like a period, in November had spotting, no bleeding since then Has gained 6 pounds since August Sometimes feels chills and has some warmth at night but no true night sweats No vomiting, but feels "something coming up from stomach"   In-person interpretor used for entirety of encounter     ROS noted in HPI. Chief complaint noted.  Other Pertinent PMH: Chronic constipation, H. pylori infection, postmenopausal bleeding Past Medical, Surgical, Social, and Family History Reviewed  & Updated per EMR.      Social History   Tobacco Use  Smoking Status Never Smoker  Smokeless Tobacco Never Used   Smoking status noted.    Objective: BP 126/62   Pulse 69   Wt 176 lb (79.8 kg)   SpO2 99%   BMI 30.21 kg/m  Vitals and nursing notes reviewed  Physical Exam:  General: 59 y.o. female in NAD Cardio: RRR no m/r/g  Lungs: CTAB, no wheezing, no rhonchi, no crackles, no IWOB on RA Abdomen: Soft, diffuse tenderness to palpation, worse in epigastric, right upper quadrant, left upper quadrant, right lower quadrant, non-distended, positive bowel sounds Skin: warm and dry Extremities: No edema   No results found for this or any previous visit (from the past 72 hour(s)).  Assessment/Plan:  PUD (peptic ulcer disease) Patient almost completed with triple therapy for H. pylori.  Completing remainder of 30 days of omeprazole.  Her symptoms do seem consistent with peptic ulcer disease and gastroesophageal reflux given her report of salty taste in her mouth and feeling something coming up from her stomach.  Given this and that it seems uncontrolled, will have patient start an H2 blocker as well, will start Pepcid 20 mg daily.  To use in conjunction with omeprazole.  It appears as though she has not been seen out of her colonoscopy by GI, therefore referral was made again.  Patient advised that if she does not hear from them by her next visit on 1/27 with Dr. Nori Riis, to let  Dr. Nori Riis know.  Chronic generalized abdominal pain Established problem, worsening.  Her chronic generalized abdominal pain may be multifactorial.  I do think that she has a component of peptic ulcer disease, but could also be aggravated by constipation.  It does not seem as though she is having adequate bowel movements, therefore recommended increasing up to twice a day MiraLAX to have bowel movement every day or at least every other day.  She was advised of this.  Also recommend referral to GI.  Added H2 blocker  for further treatment of PUD/GERD.  Reassured that her labs on 1/5 were within normal limits, no evidence of pancreatitis, white blood cell count was within normal limits, therefore doubt active infection.  She was advised of all of this.  Agree with Dr. Vikki Ports that this could also be functional abdominal pain, but would need GI referral for possible scope before this diagnosis could be made.  This picture is also complicated by the fact that patient has had postmenopausal bleeding.  She is scheduled for an endometrial biopsy in the colpo clinic on 2/4.  She was made aware of this as well.  She is able to eat, and does have some bowel movements, therefore doubt that she has a small bowel obstruction following her appendectomy in May 2020, but this could be considered.  Could consider further imaging of the abdomen and pelvis if this does not improve.  Overall picture is also complicated by the fact that patient has poor health literacy and there is a language barrier.  She has follow-up in 6 days with her PCP, which she was advised of.  She was recommended to follow-up for this.  Also recommend that if pain is significantly worse, she is unable to eat or drink, she should be seen in the ED.     PATIENT EDUCATION PROVIDED: See AVS    Diagnosis and plan along with any newly prescribed medication(s) were discussed in detail with this patient today. The patient verbalized understanding and agreed with the plan. Patient advised if symptoms worsen return to clinic or ER.     Orders Placed This Encounter  Procedures  . Ambulatory referral to Gastroenterology    Referral Priority:   Routine    Referral Type:   Consultation    Referral Reason:   Specialty Services Required    Number of Visits Requested:   1    Meds ordered this encounter  Medications  . famotidine (PEPCID) 20 MG tablet    Sig: Take 1 tablet (20 mg total) by mouth daily.    Dispense:  30 tablet    Refill:  [place], DO 08/10/2019, 4:19 PM PGY-2 [place]

## 2019-08-10 NOTE — Patient Instructions (Signed)
Thank you for coming to see me today. It was a pleasure. Today we talked about:   I have sent a new prescription.  Take this and continue to take your other prescription (omeprazole).    I have referred you to the stomach doctor.    Please follow-up with your PCP in 1 week.  If you have any questions or concerns, please do not hesitate to call the office at [phone].  Best,   [place] Constable, DO

## 2019-08-10 NOTE — Assessment & Plan Note (Signed)
Patient almost completed with triple therapy for H. pylori.  Completing remainder of 30 days of omeprazole.  Her symptoms do seem consistent with peptic ulcer disease and gastroesophageal reflux given her report of salty taste in her mouth and feeling something coming up from her stomach.  Given this and that it seems uncontrolled, will have patient start an H2 blocker as well, will start Pepcid 20 mg daily.  To use in conjunction with omeprazole.  It appears as though she has not been seen out of her colonoscopy by GI, therefore referral was made again.  Patient advised that if she does not hear from them by her next visit on 1/27 with Dr. Nori Riis, to let Dr. Nori Riis know.

## 2019-08-16 ENCOUNTER — Ambulatory Visit (INDEPENDENT_AMBULATORY_CARE_PROVIDER_SITE_OTHER): Payer: BC Managed Care – PPO | Admitting: Family Medicine

## 2019-08-16 ENCOUNTER — Encounter: Payer: Self-pay | Admitting: Family Medicine

## 2019-08-16 ENCOUNTER — Other Ambulatory Visit: Payer: Self-pay

## 2019-08-16 DIAGNOSIS — K219 Gastro-esophageal reflux disease without esophagitis: Secondary | ICD-10-CM | POA: Diagnosis not present

## 2019-08-16 DIAGNOSIS — G8929 Other chronic pain: Secondary | ICD-10-CM | POA: Diagnosis not present

## 2019-08-16 DIAGNOSIS — R1084 Generalized abdominal pain: Secondary | ICD-10-CM

## 2019-08-17 ENCOUNTER — Ambulatory Visit: Payer: BC Managed Care – PPO

## 2019-08-17 NOTE — Progress Notes (Signed)
    CHIEF COMPLAINT / HPI: She brings with her in person interpreter and the entire visit was conducted using interpreter  #1.  Follow-up abdominal pain with GERD-like symptoms.  Treated for H pylori and completed treatment.  At last office visit was given famotidine which she has continued.  She is noting increased flatulence with that.  Also some slight nausea.  Otherwise she feels much better than originally.  #2.  Musculoskeletal abdominal wall pain : also has questions about changing her work.  She works 6 days a week every week.  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  At the end of the day she notes that her abdomen is quite sore particularly where she had the appendectomy.  She wonders if it will be possible to restrict her work to 5 days a week.  She would need a note for that.   PERTINENT  PMH / PSH: I have reviewed the patient's medications, allergies, past medical and surgical history, smoking status and updated in the EMR as appropriate. Appendectomy  OBJECTIVE:  BP 116/62   Pulse 62   Wt 174 lb 9.6 oz (79.2 kg)   SpO2 99%   BMI 29.97 kg/m  GENERAL: Well-developed female no acute distress abdomen: Soft, positive bowel sounds no rebound no tender areas no guarding.  ASSESSMENT / PLAN:   Gastroesophageal reflux disease Now status post treatment for H. pylori.  She has been on famotidine for several weeks.  I think that is what is causing her nausea and bloating.  We will stop that.  I will have her follow-up with me in 3 to 4 weeks.  If she has new or worsening symptoms in the interim, she will call or come in sooner  Chronic generalized abdominal pain Sounds like her symptoms from GERD are much improved.  We will stop her famotidine.  Regarding her musculoskeletal abdominal wall pain, I will give her the requested work note.   Dorcas Mcmurray MD

## 2019-08-17 NOTE — Chronic Care Management (AMB) (Signed)
  Care Management   Outreach Note  08/17/2019 Name: Deborah Sampson MRN: [id number] DOB: 30-Sep-1960  Referred by: Dickie La, MD Reason for referral : Care Coordination (Care Manage RNCM Post menopausal bleeding)   A second unsuccessful telephone outreach was attempted today. The patient was referred to the case management team for assistance with care management and care coordination.  Called [employer] and spoke with Volney Presser.  We made a three way call.  A gentleman answered the phone and stated that the patient was on her way to work.  I left a message with my name and phone number and asked if she could return my call.  Follow Up Plan: A HIPPA compliant phone message was left for the patient providing contact information and requesting a return call.  The care management team will reach out to the patient again over the next 14 days.  The patient has been provided with contact information for the care management team and has been advised to call with any health related questions or concerns.   Lazaro Arms RN, BSN, [facility] Care Management Coordinator [place] Phone: [phone] Fax: [phone]

## 2019-08-17 NOTE — Assessment & Plan Note (Signed)
Now status post treatment for H. pylori.  She has been on famotidine for several weeks.  I think that is what is causing her nausea and bloating.  We will stop that.  I will have her follow-up with me in 3 to 4 weeks.  If she has new or worsening symptoms in the interim, she will call or come in sooner

## 2019-08-18 ENCOUNTER — Other Ambulatory Visit: Payer: Self-pay

## 2019-08-18 ENCOUNTER — Ambulatory Visit: Payer: BC Managed Care – PPO

## 2019-08-18 ENCOUNTER — Telehealth: Payer: Self-pay | Admitting: Family Medicine

## 2019-08-18 NOTE — Telephone Encounter (Signed)
Patient came into work with a letter from work and her supervisor needs the letter updated stating she needs the time or length of the restrictions for work, forms were placed in white team folder.

## 2019-08-18 NOTE — Telephone Encounter (Signed)
Pt brought work note back requesting more information as far as the restrictions. Requesting a time or length of restrictions. How long? Placed in PCP box for reference.Aphrodite Harpenau Zimmerman [place], CMA

## 2019-08-18 NOTE — Chronic Care Management (AMB) (Signed)
  Care Management   Outreach Note  08/18/2019 Name: Deborah Sampson MRN: [id number] DOB: 29-Jun-1961  Referred by: Dickie La, MD Reason for referral : No chief complaint on file.   Third unsuccessful telephone outreach was attempted today. The patient was referred to the case management team for assistance with care management and care coordination. The patient's primary care provider has been notified of our unsuccessful attempts to make or maintain contact with the patient. The care management team is pleased to engage with this patient at any time in the future should he/she be interested in assistance from the care management team.   [employer] was called and spoke with [facility] phone number was called on three way.  She stated that it did not allow [place] to leave a message.  Follow Up Plan: The care management team is available to follow up with the patient after provider conversation with the patient regarding recommendation for care management engagement and subsequent re-referral to the care management team.   Lazaro Arms RN, BSN, [place] Management Coordinator [place] Phone: [phone] Fax: [phone]

## 2019-08-18 NOTE — Assessment & Plan Note (Signed)
Sounds like her symptoms from GERD are much improved.  We will stop her famotidine.  Regarding her musculoskeletal abdominal wall pain, I will give her the requested work note.

## 2019-08-22 ENCOUNTER — Encounter: Payer: Self-pay | Admitting: Family Medicine

## 2019-08-22 NOTE — Telephone Encounter (Signed)
RN team I am placing on your desk. THANKS! Deborah Sampson

## 2019-08-23 NOTE — Telephone Encounter (Signed)
Letter up front for pick up. Attempted to call patient to inform, however no answer or option for VM.

## 2019-08-24 ENCOUNTER — Ambulatory Visit (INDEPENDENT_AMBULATORY_CARE_PROVIDER_SITE_OTHER): Payer: BC Managed Care – PPO | Admitting: Family Medicine

## 2019-08-24 ENCOUNTER — Other Ambulatory Visit (HOSPITAL_COMMUNITY)
Admission: RE | Admit: 2019-08-24 | Discharge: 2019-08-24 | Disposition: A | Discharge status: Home / Self Care | Payer: BC Managed Care – PPO | Source: Ambulatory Visit | Attending: Family Medicine | Admitting: Family Medicine

## 2019-08-24 ENCOUNTER — Other Ambulatory Visit: Payer: Self-pay | Admitting: Family Medicine

## 2019-08-24 ENCOUNTER — Other Ambulatory Visit: Payer: Self-pay

## 2019-08-24 VITALS — BP 104/60 | HR 70 | Wt 177.2 lb

## 2019-08-24 DIAGNOSIS — N950 Postmenopausal bleeding: Secondary | ICD-10-CM

## 2019-08-24 DIAGNOSIS — Z124 Encounter for screening for malignant neoplasm of cervix: Secondary | ICD-10-CM | POA: Diagnosis not present

## 2019-08-24 DIAGNOSIS — Z01419 Encounter for gynecological examination (general) (routine) without abnormal findings: Secondary | ICD-10-CM

## 2019-08-24 NOTE — Patient Instructions (Signed)
  BIOPSY ENDOMETRIAL POST-AMABWIRIZA  Urashobora gufata Ibuprofen, Aleve cyangwa Tylenol kubabara niba bikenewe. Kugabanuka bigomba gukemuka mumasaha 24.  Karie Schwalbe Holley Bouche muto wo [place]. Ugomba kwambara mini padi muminsi iri imbere.  Karie Schwalbe gukora imibonano nyuma yamasaha 24.  Ugomba guhamagara niba ufite ububabare bwo [place], umuriro, kuva amaraso menshi cyangwa [place] kwimyanya ndangagitsina.  Kwiyuhagira cyangwa kwiyuhagira nkuko bisanzwe  6. Tuzaguhamagara mugihe cyicyumweru kimwe nibisubizo cyangwa tuzaganira kubisubizo [place] yawe yo gukurikirana niba bikenewe.    ENDOMETRIAL BIOPSY POST-PROCEDURE INSTRUCTIONS  1. You may take Ibuprofen, Aleve or Tylenol for pain if needed.  Cramping should resolve within in 24 hours.  2. You may have a small amount of spotting.  You should wear a mini pad for the next few days.  3. You may have intercourse after 24 hours.  4. You need to call if you have any pelvic pain, fever, heavy bleeding or foul smelling vaginal discharge.  5. Shower or bathe as normal  6. We will call you within one week with results or we will discuss   the results at your follow-up appointment if needed.

## 2019-08-24 NOTE — Progress Notes (Signed)
Interpreter ID#: [id number]  CC: Post Menopausal Bleed and Need PAP  HPI: Here for EB due to postmenopausal bleed. Also will like to get a PAP. More than 20 min spent on discussing visit reasons and the procedure due to language barrier with the use of an interpreter.  Endometrial Biopsy Procedure Note  Pre-operative Diagnosis: Post Menopausal Bleed and PAP  Post-operative Diagnosis: same  Indications: postmenopausal bleeding  Procedure Details   Urine pregnancy test was not done.  The risks (including infection, bleeding, pain, and uterine perforation) and benefits of the procedure were explained to the patient and Written informed consent was obtained.  Antibiotic prophylaxis against endocarditis was not indicated.  A Graves' speculum inserted in the vagina, and the cervix prepped with povidone iodine.  Endocervical curettage with a Kevorkian curette was not performed.   A sharp tenaculum was applied to the posterior lip of the cervix for stabilization.  A sterile uterine sound was used to sound the uterus to a depth of 6cm.  A Pipelle endometrial aspirator was used to sample the endometrium.  Sample was sent for pathologic examination.  Condition: Stable  Complications: None  Plan:  The patient was advised to call for any fever or for prolonged or severe pain or bleeding. She was advised to use NSAID as needed for mild to moderate pain. She was advised to avoid vaginal intercourse for 48 hours or until the bleeding has completely stopped.

## 2019-08-25 ENCOUNTER — Telehealth: Payer: Self-pay | Admitting: Family Medicine

## 2019-08-25 NOTE — Telephone Encounter (Signed)
Multiple attempts made to discuss test results.  Phone not working. The other is a wrong number.  Thankfully her EB is negative for malignancy. F/U with PCP for further evaluation.

## 2019-08-28 LAB — CYTOLOGY - PAP
Comment: NEGATIVE
Diagnosis: NEGATIVE
High risk HPV: NEGATIVE

## 2019-09-06 ENCOUNTER — Ambulatory Visit: Payer: BC Managed Care – PPO | Admitting: Family Medicine

## 2019-09-18 ENCOUNTER — Telehealth: Payer: Self-pay | Admitting: Family Medicine

## 2019-09-18 NOTE — Telephone Encounter (Signed)
Patient came to office inquiring about lab results and need more Hydrocortisone Butyrate cream.  Please call with interpreter for  [facility].

## 2019-09-19 NOTE — Telephone Encounter (Signed)
Multiple attempts made to reach her previously. Please help her schedule f/u with PCP on if she is till in the office, get her on someone's schedule to review result with her. Thanks.

## 2019-09-28 ENCOUNTER — Encounter: Payer: Self-pay | Admitting: Family Medicine

## 2019-11-24 ENCOUNTER — Ambulatory Visit: Payer: BC Managed Care – PPO

## 2019-11-24 ENCOUNTER — Other Ambulatory Visit: Payer: Self-pay

## 2019-11-24 ENCOUNTER — Ambulatory Visit (INDEPENDENT_AMBULATORY_CARE_PROVIDER_SITE_OTHER): Payer: BC Managed Care – PPO | Admitting: Family Medicine

## 2019-11-24 ENCOUNTER — Encounter: Payer: Self-pay | Admitting: Family Medicine

## 2019-11-24 VITALS — BP 138/80 | HR 67 | Ht 64.0 in | Wt 179.6 lb

## 2019-11-24 DIAGNOSIS — K279 Peptic ulcer, site unspecified, unspecified as acute or chronic, without hemorrhage or perforation: Secondary | ICD-10-CM

## 2019-11-24 MED ORDER — METRONIDAZOLE 500 MG PO TABS
500.0000 mg | ORAL_TABLET | Freq: Three times a day (TID) | ORAL | 0 refills | Status: AC
Start: 2019-11-24 — End: 2019-12-08

## 2019-11-24 MED ORDER — CLARITHROMYCIN 500 MG PO TABS
500.0000 mg | ORAL_TABLET | Freq: Two times a day (BID) | ORAL | 0 refills | Status: AC
Start: 2019-11-24 — End: 2019-12-08

## 2019-11-24 MED ORDER — OMEPRAZOLE 20 MG PO CPDR
20.0000 mg | DELAYED_RELEASE_CAPSULE | Freq: Every day | ORAL | 1 refills | Status: DC
Start: 2019-11-24 — End: 2020-04-05

## 2019-11-24 NOTE — Progress Notes (Signed)
    SUBJECTIVE:   CHIEF COMPLAINT / HPI:   Stomach pain: Patient states his stomach pains been going on for at least 2 years.  No relation to food.  Went really bad it prevents sleep.  She endorses salty taste when the pain is occurring consistent with GERD.  She states she had tried medication for in the past (chart review shows omeprazole and famotidine) but stopped taking these as they were not working.  When asked about previous H. pylori testing and taking 3 medications for this she denies ever starting or completing triple therapy.  She denies diarrhea, has some constipation on occasion.  States her pain is located to the epigastric region and occurs every day.  Phone interpreter used for duration of encounter.  PERTINENT  PMH / PSH: Previous documented history of H. pylori, patient denies ever picking up or taking treatment for it last year.  OBJECTIVE:   BP 138/80   Pulse 67   Ht 5\' 4"  (1.626 m)   Wt 179 lb 9.6 oz (81.5 kg)   SpO2 100%   BMI 30.83 kg/m    General: NAD, pleasant, able to participate in exam Cardiac: RRR, no murmurs. Respiratory: CTAB, normal effort Abdomen: Bowel sounds present, mild tenderness noted in epigastric and right upper quadrant without peritoneal findings, nondistended, no hepatosplenomegaly. Psych: Normal affect and mood  ASSESSMENT/PLAN:   Peptic ulcer disease with history of H. pylori: Patient with documented history of H. pylori testing in August 2020.  Upon talking with patient she is sure she never picked up or started the triple therapy.  Patient continues to endorse epigastric pain that is consistent with her H. pylori and has presented since before her H. pylori testing in 2020. Plan: -Discussed case with Dr. Andria Frames -We will prescribe triple therapy for H. pylori providing clear documentation for patient and review multiple times with interpreter the correct dosing. -Recommend follow-up in 2 months if symptoms worsen or do not  improve  Lurline Del, [place]

## 2019-11-24 NOTE — Patient Instructions (Addendum)
    It was great to see you!  Our plans for today:  - I am sending 3 medications for your stomach pain.  -You will take metronidazole twice per day(1 pill in morning and one in evening) for 14 days.  -You will also take clarithromycin twice per day (1 pill in morning and one in evening) for 14 days. - You will also take a medication called  omeprazole 1 time per day which you will continue taking this medicine.  Please return in 2 months if you are not having improvement in her symptoms.  Take care and seek immediate care sooner if you develop any concerns.   Dr. [facility]     To the pharmacist: -[place], I do not speak English however I need to pick up these 3 medications to treat H. pylori.

## 2020-01-23 NOTE — Progress Notes (Signed)
    SUBJECTIVE:   CHIEF COMPLAINT / HPI: "not feeling well":   Interpreter was used using ipad.     Abdominal pain.:  Pt was treated for h. Pylori infection two months ago. She took the full course of medication and said she felt slightly better temporarily but pain is now worse.  She describes the pain as going across her entire upper abdomen, but worst in the center.  It improves slightly after eating but then returns.  She is not having regurgitation.  She does have 'the urge to cough' like something is moving up her throat and has a 'burning' in her throat. She is currently ont taking any medication for it.  She has reduced appetite because of these symptoms.   PERTINENT  PMH / PSH: PUD  OBJECTIVE:   BP 110/62   Pulse 72   Wt 180 lb (81.6 kg)   SpO2 98%   BMI 30.90 kg/m   Gen: alert.  Mild discomfort.  Husband present.  Cv: RRR.  Pulm: lctab.  Abd: TTP in the epigastric, RUQ, LUQ regions.  Normal bowel sounds.    ASSESSMENT/PLAN:   PUD (peptic ulcer disease) Appears to have been treated at least twice in the past year for h. Pylor but symptoms remain.  Difficult to pinpoint symptoms  Due to language barrier but appears to have peptic ulcer disease and possibly GERD. Unlikely pancreatitis given duration of symptoms.  Slight improvement with meals make cholecystitis unlikely. Will  Refer to GI for likely EDG for further evaluation of dyspepsia.  Will start famotidine to help with symptoms in the meantime.       Benay Pike, MD [place]

## 2020-01-24 ENCOUNTER — Other Ambulatory Visit: Payer: Self-pay

## 2020-01-24 ENCOUNTER — Ambulatory Visit (INDEPENDENT_AMBULATORY_CARE_PROVIDER_SITE_OTHER): Payer: BC Managed Care – PPO | Admitting: Family Medicine

## 2020-01-24 VITALS — BP 110/62 | HR 72 | Wt 180.0 lb

## 2020-01-24 DIAGNOSIS — K219 Gastro-esophageal reflux disease without esophagitis: Secondary | ICD-10-CM

## 2020-01-24 DIAGNOSIS — K279 Peptic ulcer, site unspecified, unspecified as acute or chronic, without hemorrhage or perforation: Secondary | ICD-10-CM

## 2020-01-24 MED ORDER — FAMOTIDINE 40 MG PO TABS
40.0000 mg | ORAL_TABLET | Freq: Every day | ORAL | 0 refills | Status: DC
Start: 2020-01-24 — End: 2020-08-29

## 2020-01-24 NOTE — Patient Instructions (Addendum)
I have sent you a referral to the gastroenterologist.   In the meantime please take this medication once a day.    Have a great day.   Clemetine Marker, MD

## 2020-01-26 NOTE — Assessment & Plan Note (Signed)
Appears to have been treated at least twice in the past year for h. Pylor but symptoms remain.  Difficult to pinpoint symptoms  Due to language barrier but appears to have peptic ulcer disease and possibly GERD. Unlikely pancreatitis given duration of symptoms.  Slight improvement with meals make cholecystitis unlikely. Will  Refer to GI for likely EDG for further evaluation of dyspepsia.  Will start famotidine to help with symptoms in the meantime.

## 2020-02-05 ENCOUNTER — Encounter: Payer: Self-pay | Admitting: Gastroenterology

## 2020-02-05 ENCOUNTER — Other Ambulatory Visit: Payer: Self-pay

## 2020-02-05 ENCOUNTER — Encounter: Payer: Self-pay | Admitting: Family Medicine

## 2020-02-05 ENCOUNTER — Ambulatory Visit: Payer: BC Managed Care – PPO | Admitting: Family Medicine

## 2020-02-05 VITALS — BP 124/68 | HR 72 | Ht 64.0 in | Wt 180.8 lb

## 2020-02-05 DIAGNOSIS — K279 Peptic ulcer, site unspecified, unspecified as acute or chronic, without hemorrhage or perforation: Secondary | ICD-10-CM | POA: Diagnosis not present

## 2020-02-05 NOTE — Patient Instructions (Addendum)
Lovely to see you. Please contact the number provided to contact [facility].  [place]  Dr Posey Pronto

## 2020-02-05 NOTE — Progress Notes (Signed)
° ° °  SUBJECTIVE:   CHIEF COMPLAINT / HPI:   Marbeth Smedley is a 59 yr old female who presents today for follow-up for epigastric pain  [place] interpreter was used for this encounter.  Patient husband was also present today.  Epigastric pain Patient endorses 1 year history of epigastric pain, described as a burning type pain.  Radiates to the back and constant in nature.  8/10 out of severity. No relieving factors or exacerbating factors.  Patient reports that his pain is not related to food. She was seen several times in the clinic for the same problem and treated twice for H. pylori infection so far.  She is also seen on 01/26/2020 and prescribed famotidine for her ongoing epigastric pain which has not helped.  She was told to follow-up today if her pain had not improved.  She thinks that she has not been referred to GI.    PERTINENT  PMH / PSH: H. pylori, GERD  OBJECTIVE:   BP 124/68    Pulse 72    Ht 5\' 4"  (1.626 m)    Wt 180 lb 12.8 oz (82 kg)    SpO2 98%    BMI 31.03 kg/m   General: Alert, no acute distress, pleasant Cardio: Normal S1 and S2, RRR   Pulm: CTAB, normal work of breathing  Abdomen: Bowel sounds normal.  Abdomen nondistended, significant epigastric tenderness, no guarding, bowel sounds present Extremities: No peripheral edema. Warm/ well perfused.  Strong radial pulse Neuro: Cranial nerves grossly intact  ASSESSMENT/PLAN:   PUD (peptic ulcer disease) Considering PUD secondary to H. pylori as most likely diagnosis given character of pain and recent history of intractable H. pylori infections. Considered biliary colic however pain is constant and burning would expect a more colicky pain.  Also considered cholecystitis however presentation is chronic.  No evidence of previous EGD on file.  Patient has already been referred to GI from previous visit by Dr. Jeannine Kitten.  CMA Jazmin Sendil of allergy I have been trying to contact her for several days.  Provided interpreter with  [facility] contact number and asked if you could kindly call GI for [facility] as she does not speak [place].  Continue famotidine and omeprazole in the meantime.     Lattie Haw, MD [place]

## 2020-02-05 NOTE — Assessment & Plan Note (Addendum)
Considering PUD secondary to H. pylori as most likely diagnosis given character of pain and recent history of intractable H. pylori infections. Considered biliary colic however pain is constant and burning would expect a more colicky pain.  Also considered cholecystitis however presentation is chronic.  No evidence of previous EGD on file.  Patient has already been referred to GI from previous visit by Dr. Jeannine Kitten.  CMA Jazmin Sendil of allergy I have been trying to contact her for several days.  Provided interpreter with [facility] contact number and asked if you could kindly call GI for [facility] as she does not speak [place].  Continue famotidine and omeprazole in the meantime.

## 2020-04-05 ENCOUNTER — Ambulatory Visit (INDEPENDENT_AMBULATORY_CARE_PROVIDER_SITE_OTHER): Payer: BC Managed Care – PPO | Admitting: Gastroenterology

## 2020-04-05 ENCOUNTER — Other Ambulatory Visit (INDEPENDENT_AMBULATORY_CARE_PROVIDER_SITE_OTHER): Payer: BC Managed Care – PPO

## 2020-04-05 ENCOUNTER — Encounter: Payer: Self-pay | Admitting: Gastroenterology

## 2020-04-05 VITALS — BP 120/60 | HR 80 | Ht 64.5 in | Wt 177.0 lb

## 2020-04-05 DIAGNOSIS — Z1211 Encounter for screening for malignant neoplasm of colon: Secondary | ICD-10-CM

## 2020-04-05 DIAGNOSIS — R1013 Epigastric pain: Secondary | ICD-10-CM

## 2020-04-05 DIAGNOSIS — K219 Gastro-esophageal reflux disease without esophagitis: Secondary | ICD-10-CM | POA: Diagnosis not present

## 2020-04-05 DIAGNOSIS — K5900 Constipation, unspecified: Secondary | ICD-10-CM

## 2020-04-05 DIAGNOSIS — R630 Anorexia: Secondary | ICD-10-CM | POA: Diagnosis not present

## 2020-04-05 LAB — COMPREHENSIVE METABOLIC PANEL
ALT: 22 U/L (ref 0–35)
AST: 16 U/L (ref 0–37)
Albumin: 4.1 g/dL (ref 3.5–5.2)
Alkaline Phosphatase: 82 U/L (ref 39–117)
BUN: 14 mg/dL (ref 6–23)
CO2: 32 mEq/L (ref 19–32)
Calcium: 9.9 mg/dL (ref 8.4–10.5)
Chloride: 101 mEq/L (ref 96–112)
Creatinine, Ser: 0.79 mg/dL (ref 0.40–1.20)
GFR: 90.54 mL/min (ref >60.00)
Glucose, Bld: 107 mg/dL — ABNORMAL HIGH (ref 70–99)
Potassium: 4.4 mEq/L (ref 3.5–5.1)
Sodium: 138 mEq/L (ref 135–145)
Total Bilirubin: 0.4 mg/dL (ref 0.2–1.2)
Total Protein: 7.9 g/dL (ref 6.0–8.3)

## 2020-04-05 LAB — CBC WITH DIFFERENTIAL/PLATELET
Basophils Absolute: 0 10*3/uL (ref 0.0–0.1)
Basophils Relative: 0.7 % (ref 0.0–3.0)
Eosinophils Absolute: 0.1 10*3/uL (ref 0.0–0.7)
Eosinophils Relative: 1.1 % (ref 0.0–5.0)
HCT: 41.7 % (ref 36.0–46.0)
Hemoglobin: 14.2 g/dL (ref 12.0–15.0)
Lymphocytes Relative: 33.3 % (ref 12.0–46.0)
Lymphs Abs: 1.8 10*3/uL (ref 0.7–4.0)
MCHC: 34.1 g/dL (ref 30.0–36.0)
MCV: 83.4 fl (ref 78.0–100.0)
Monocytes Absolute: 0.4 10*3/uL (ref 0.1–1.0)
Monocytes Relative: 7.8 % (ref 3.0–12.0)
Neutro Abs: 3.1 10*3/uL (ref 1.4–7.7)
Neutrophils Relative %: 57.1 % (ref 43.0–77.0)
Platelets: 251 10*3/uL (ref 150.0–400.0)
RBC: 5 Mil/uL (ref 3.87–5.11)
RDW: 12.8 % (ref 11.5–15.5)
WBC: 5.4 10*3/uL (ref 4.0–10.5)

## 2020-04-05 LAB — LIPASE: Lipase: 25 U/L (ref 11.0–59.0)

## 2020-04-05 MED ORDER — SUCRALFATE 1 G PO TABS
1.0000 g | ORAL_TABLET | Freq: Four times a day (QID) | ORAL | 1 refills | Status: DC | PRN
Start: 2020-04-05 — End: 2020-08-29

## 2020-04-05 MED ORDER — SUTAB 1479-225-188 MG PO TABS: 1.0000 | ORAL_TABLET | ORAL | 0 refills | Status: DC

## 2020-04-05 MED ORDER — DEXILANT 60 MG PO CPDR: 60.0000 mg | DELAYED_RELEASE_CAPSULE | Freq: Every day | ORAL | 0 refills | Status: DC

## 2020-04-05 NOTE — Patient Instructions (Addendum)
Your provider has requested that you go to the basement level for lab work before leaving today. Press "B" on the elevator. The lab is located at the first door on the left as you exit the elevator.  We have sent the following medications to your pharmacy for you to pick up at your convenience: Carafate  Stop taking Ibuprofen and only use tylenol.  We are giving you Dexilant samples today: take one 30 minutes prior to breakfast. Call us if this helps and we can send it in.   Take over the counter miralax daily and adjust the dose as needed for constipation.   You have been scheduled for an endoscopy and colonoscopy. Please follow the written instructions given to you at your visit today. Please pick up your prep supplies at the pharmacy within the next 1-3 days. If you use inhalers (even only as needed), please bring them with you on the day of your procedure.   I appreciate the opportunity to care for you. Tompkins Cellar, MD   Danie Binder was the interpreter from [facility] today for the patients visit. She has a copy of the procedure instructions to help patient prep and patient's phone # to let us know if patient gets covid vaccine on Monday and which one.

## 2020-04-05 NOTE — Progress Notes (Signed)
HPI :  59 year old female from [place], history of H. pylori infection, history of appendicitis status post appendectomy May 2020, headaches, referred by Mallie Mussel, MD for abdominal pain.  Patient is accompanied by her husband.  A translator is present today to obtain history.  The patient states she has had some epigastric pain since April of this year.  She states she will feel it every day although the severity fluctuates.  Sounds like it is present most of the time although hard for her to clarify this.  She has a poor appetite due to her symptoms however eating does not make her symptoms worse.  She does have occasional nausea, but no vomiting.  She denies any triggers specifically that can make her symptoms worse.  It sounds like she does have some burning in the area with regurgitation at times.  She denies any blood in her stools.  She does have some chronic constipation, sometimes does not pass too much and has hard stools.  That has started since she came to [place] about 4 years ago.  Despite her poor appetite she states her weight has been stable over this time.  On review of her record she has reported history of H. pylori.  She was treated for this in 2020 after she had a positive breath test.  She is unaware of this history.  She does use NSAIDs for periodic headaches.  She takes ibuprofen about 4 days/week on average.  It sounds like she was given a trial of Prilosec in the past and then transition to Pepcid 40 mg a day.  She states that does not really help anything, have a hard time clarify from them if PPI has helped her in the past.  She has never had a prior upper endoscopy, no colonoscopy.  No family history of gastric cancer or colon cancer.  Prior imaging: CT scan abdomen / pelvis 11/21/18 - acute appendicitis, moderate to severe spinal stenosis   Last labs on file were from January 2021, hemoglobin 13.8, WBC 3.8.  LFTs normal, lipase normal.   Past Medical History:    Diagnosis Date  . Acute appendicitis 11/21/2018  . Back pain 11/27/2015  . H. pylori infection 07/25/2019  . Headache 12/15/2016  . Palpitations 11/27/2015  . Patient speaks only [place] 07/25/2019  . Refugee health examination 11/27/2015  . S/P laparoscopic appendectomy 11/21/2018  . Scars, cultural scarification 11/27/2015     Past Surgical History:  Procedure Laterality Date  . LAPAROSCOPIC APPENDECTOMY N/A 11/21/2018   Procedure: APPENDECTOMY LAPAROSCOPIC;  Surgeon: Rolm Bookbinder, MD;  Location: [facility];  Service: General;  Laterality: N/A;   Family History  Family history unknown: Yes   Social History   Tobacco Use  . Smoking status: Former Smoker    Types: Cigarettes    Quit date: 04/05/2014    Years since quitting: 6.0  . Smokeless tobacco: Never Used  Vaping Use  . Vaping Use: Never used  Substance Use Topics  . Alcohol use: No  . Drug use: No   Current Outpatient Medications  Medication Sig Dispense Refill  . famotidine (PEPCID) 40 MG tablet Take 1 tablet (40 mg total) by mouth daily. 90 tablet 0  . guaiFENesin (COUGH SYRUP PO) Take 1 Dose by mouth as needed.     No current facility-administered medications for this visit.   Allergies  Allergen Reactions  . Penicillins Rash     Review of Systems: All systems reviewed and negative except where  noted in HPI.    Labs per HPI  Physical Exam: BP 120/60 (BP Location: Left Arm, Patient Position: Sitting, Cuff Size: Normal)   Pulse 80   Ht 5' 4.5" (1.638 m) Comment: height measured without shoes  Wt 177 lb (80.3 kg)   BMI 29.91 kg/m  Constitutional: Pleasant,well-developed, female in no acute distress. HEENT: Normocephalic and atraumatic. Conjunctivae are normal. No scleral icterus. Neck supple.  Cardiovascular: Normal rate, regular rhythm.  Pulmonary/chest: Effort normal and breath sounds normal.  Abdominal: Soft, nondistended, (+) tender in epigastric area. There are no masses palpable.  Extremities:  no edema Lymphadenopathy: No cervical adenopathy noted. Neurological: Alert and oriented to person place and time. Skin: Skin is warm and dry. No rashes noted. Psychiatric: Normal mood and affect. Behavior is normal.   ASSESSMENT AND PLAN: 59 year old female here for new patient assessment of the following:  Epigastric pain / poor appetite / GERD / history of H. Pylori - had a long discussion with the patient and her husband today with the assistance of the translator.  Sounds like she has some chronic upper abdominal discomfort, she thinks since April however she has had a work-up and evaluation and treatment for H. pylori a few times in the past.  She has ongoing use of NSAIDs, with her history of H. pylori, she is at risk for PUD and that is possible.  I also do not see that she has had H. pylori eradication testing.  Biliary colic seems less likely.  Given the symptoms I am recommending an upper endoscopy to further evaluate.  I discussed what endoscopy is, risks benefits of that and anesthesia, and she wanted to proceed.  In the interim I will give her an empiric trial of Dexilant 60 mg once daily (I had a free sample for 2-week course - if this provides benefit she will contact me and we can give her prescription for this or another PPI), as well as Carafate as needed to see if that will provide some benefit in the short-term.  I recommend that she completely stop using NSAIDs, and she should use Tylenol as needed for her symptoms.  I will send her to the lab today for some baseline labs to ensure no anemia or significant abnormality since her labs were last checked earlier in the year.  She was agreement with the plan.  If the exam is negative and her symptoms persist despite the regimen, she will warrant cross-sectional imaging with CT scan.  Constipation / Colon cancer screening - recommend MiraLAX every day and titrate up or down as needed for treatment of constipation.  Will see if this  provides some relief however her symptoms are not really consistent with that of constipation.  However she has never had a prior colonoscopy and is due for colon cancer screening.  I offered her a colonoscopy to be done at the same time as her upper endoscopy, she wanted to proceed.  Further recommendations pending the results.  Laurel Cellar, MD [facility]  CC: Dickie La, MD

## 2020-04-08 ENCOUNTER — Telehealth: Payer: Self-pay

## 2020-04-08 NOTE — Telephone Encounter (Signed)
Patient is scheduled to be vaccinated against Covid 19 today, Monday, 04-08-20.  If she receives [employer] vaccine her 2nd one will be 3 weeks later which will be 2 weeks before her procedure so she won't need to be Covid tested.  However, if she receives the Moderna vaccine, the 2nd shot will be 4 weeks later (28 days) which would NOT be two weeks before her procedure so she would need to be Covid tested.

## 2020-04-09 ENCOUNTER — Telehealth: Payer: Self-pay

## 2020-04-09 NOTE — Telephone Encounter (Signed)
Using the language line I called and spoke to pt.  She was supposed to get her 1st Covid vaccine yesterday.  Depending on the type of vaccine she got would determine if she would have time to be fully vaccinated 2 weeks prior to her procedure. If she was, she would not need to be tested for Covid.  However If she received the [facility], she would not have time to get the 2nd injection in time and would need to be tested.  The patient reported she did not get the vaccine yesterday bc she did not have an appt. Therefore we scheduled her for a Covid test on Wednesday, 10-27 at 10:00am.  She was unable to write down the appt time, day and address bc she does not write.  I am mailing her the instructions and she will have to find someone to help her understand them.  Patient does not read or write even in her native tongue. I am unsure how she will be able to get to test on 10-27 and/or her procedure on 10-29 because she can not read instructions. The interpretor that was here at her appt said she would call her and tell her what to do for the prep.

## 2020-04-09 NOTE — Telephone Encounter (Signed)
See other telephone note 04-09-20. Patient scheduled for Covid test 2 days prior to procedure.

## 2020-05-16 ENCOUNTER — Other Ambulatory Visit: Payer: Self-pay | Admitting: Gastroenterology

## 2020-05-16 MED ORDER — SUTAB 1479-225-188 MG PO TABS: 1.0000 | ORAL_TABLET | ORAL | 0 refills | Status: DC

## 2020-05-16 NOTE — Telephone Encounter (Signed)
Prescription for Sutab resent to patient's pharmacy.

## 2020-05-16 NOTE — Telephone Encounter (Signed)
Pt is requesting for her SUTAB to be sent to her pharmacy.  [facility]

## 2020-05-17 ENCOUNTER — Ambulatory Visit (AMBULATORY_SURGERY_CENTER): Payer: BC Managed Care – PPO | Admitting: Gastroenterology

## 2020-05-17 ENCOUNTER — Encounter: Payer: Self-pay | Admitting: Gastroenterology

## 2020-05-17 ENCOUNTER — Other Ambulatory Visit: Payer: Self-pay

## 2020-05-17 VITALS — BP 121/79 | HR 74 | Temp 97.5°F | Resp 17 | Ht 64.0 in | Wt 177.0 lb

## 2020-05-17 DIAGNOSIS — K2970 Gastritis, unspecified, without bleeding: Secondary | ICD-10-CM | POA: Diagnosis not present

## 2020-05-17 DIAGNOSIS — K5140 Inflammatory polyps of colon without complications: Secondary | ICD-10-CM | POA: Diagnosis not present

## 2020-05-17 DIAGNOSIS — K2950 Unspecified chronic gastritis without bleeding: Secondary | ICD-10-CM | POA: Diagnosis not present

## 2020-05-17 DIAGNOSIS — Z8619 Personal history of other infectious and parasitic diseases: Secondary | ICD-10-CM

## 2020-05-17 DIAGNOSIS — R109 Unspecified abdominal pain: Secondary | ICD-10-CM | POA: Diagnosis not present

## 2020-05-17 DIAGNOSIS — R194 Change in bowel habit: Secondary | ICD-10-CM

## 2020-05-17 DIAGNOSIS — K649 Unspecified hemorrhoids: Secondary | ICD-10-CM

## 2020-05-17 DIAGNOSIS — D120 Benign neoplasm of cecum: Secondary | ICD-10-CM

## 2020-05-17 DIAGNOSIS — R630 Anorexia: Secondary | ICD-10-CM

## 2020-05-17 MED ORDER — SODIUM CHLORIDE 0.9 % IV SOLN: 500.0000 mL | Freq: Once | INTRAVENOUS | Status: DC

## 2020-05-17 NOTE — Op Note (Signed)
[place] Patient Name: [facility] Procedure Date: 05/17/2020 4:28 PM MRN: [id number] Endoscopist: Remo Lipps P. Havery [name] , MD Age: 59 Referring MD:  Date of Birth: 03-Jul-1961 Gender: Female Account #: [id number] Procedure:                Upper GI endoscopy Indications:              Abdominal pain, history of Helicobacter pylori,                            decreased appetite - improved per patient following                            trial of Dexilant Medicines:                Monitored Anesthesia Care Procedure:                Pre-Anesthesia Assessment:                           - Prior to the procedure, a History and Physical                            was performed, and patient medications and                            allergies were reviewed. The patient's tolerance of                            previous anesthesia was also reviewed. The risks                            and benefits of the procedure and the sedation                            options and risks were discussed with the patient.                            All questions were answered, and informed consent                            was obtained. Prior Anticoagulants: The patient has                            taken no previous anticoagulant or antiplatelet                            agents. ASA Grade Assessment: II - A patient with                            mild systemic disease. After reviewing the risks                            and benefits, the patient was deemed in  satisfactory condition to undergo the procedure.                           After obtaining informed consent, the endoscope was                            passed under direct vision. Throughout the                            procedure, the patient's blood pressure, pulse, and                            oxygen saturations were monitored continuously. The                            Endoscope was introduced  through the mouth, and                            advanced to the second part of duodenum. The upper                            GI endoscopy was accomplished without difficulty.                            The patient tolerated the procedure well. Scope In: Scope Out: Findings:                 Esophagogastric landmarks were identified: the                            Z-line was found at 37 cm, the gastroesophageal                            junction was found at 37 cm and the upper extent of                            the gastric folds was found at 37 cm from the                            incisors.                           The exam of the esophagus was otherwise normal.                           Patchy mildly erythematous mucosa was found in the                            gastric body without focal ulceration.                           The exam of the stomach was otherwise normal.  Biopsies were taken with a cold forceps in the                            gastric body, at the incisura and in the gastric                            antrum for Helicobacter pylori testing.                           The duodenal bulb and second portion of the                            duodenum were normal. Complications:            No immediate complications. Estimated blood loss:                            Minimal. Estimated Blood Loss:     Estimated blood loss was minimal. Impression:               - Esophagogastric landmarks identified.                           - Normal esophagus                           - Erythematous mucosa in the gastric body.                           - Normal stomach otherwise - biopsies taken to rule                            out H pylori                           - Normal duodenal bulb and second portion of the                            duodenum. Recommendation:           - Patient has a contact number available for                             emergencies. The signs and symptoms of potential                            delayed complications were discussed with the                            patient. Return to normal activities tomorrow.                            Written discharge instructions were provided to the                            patient.                           -  Resume previous diet.                           - Continue present medications.                           - Await pathology results. Remo Lipps P. Clary Boulais, MD 05/17/2020 5:18:31 PM This report has been signed electronically.

## 2020-05-17 NOTE — Progress Notes (Signed)
Called to room to assist during endoscopic procedure.  Patient ID and intended procedure confirmed with present staff. Received instructions for my participation in the procedure from the performing physician.  

## 2020-05-17 NOTE — Op Note (Addendum)
[facility] Patient Name: Deborah Sampson Procedure Date: 05/17/2020 4:27 PM MRN: [id number] Endoscopist: Remo Lipps P. Havery [name] , MD Age: 59 Referring MD:  Date of Birth: [birth year] Gender: Female Account #: [id number] Procedure:                Colonoscopy Indications:              Abdominal pain, altered bowel habits (constipation)                            - on Miralax, first time colonoscopy Medicines:                Monitored Anesthesia Care Procedure:                Pre-Anesthesia Assessment:                           - Prior to the procedure, a History and Physical                            was performed, and patient medications and                            allergies were reviewed. The patient's tolerance of                            previous anesthesia was also reviewed. The risks                            and benefits of the procedure and the sedation                            options and risks were discussed with the patient.                            All questions were answered, and informed consent                            was obtained. Prior Anticoagulants: The patient has                            taken no previous anticoagulant or antiplatelet                            agents. ASA Grade Assessment: II - A patient with                            mild systemic disease. After reviewing the risks                            and benefits, the patient was deemed in                            satisfactory condition to undergo the procedure.  After obtaining informed consent, the colonoscope                            was passed under direct vision. Throughout the                            procedure, the patient's blood pressure, pulse, and                            oxygen saturations were monitored continuously. The                            Colonoscope was introduced through the anus and                            advanced to the  the terminal ileum, with                            identification of the appendiceal orifice and IC                            valve. The colonoscopy was performed without                            difficulty. The patient tolerated the procedure                            well. The quality of the bowel preparation was                            good. The terminal ileum, ileocecal valve,                            appendiceal orifice, and rectum were photographed. Scope In: 4:42:05 PM Scope Out: 5:04:35 PM Scope Withdrawal Time: 0 hours 20 minutes 22 seconds  Total Procedure Duration: 0 hours 22 minutes 30 seconds  Findings:                 The perianal and digital rectal examinations were                            normal.                           The terminal ileum appeared normal.                           A diminutive polyp was found in the cecum. The                            polyp was sessile. The polyp was removed with a                            cold biopsy forceps, it could not be grasped with  the snare. Resection and retrieval were complete.                            (Jar 2)                           A 3 mm polyp was found in the cecum. The polyp was                            sessile. The polyp was removed with a cold snare.                            Resection and retrieval were complete. (Jar 2)                           Altered mucosa was found in the cecum near                            appendiceal orifice. Unclear if this represents a                            focal normal variant vs. flat polyp, clear borders                            were hard to delineate on white light and NBI, did                            not appear to be invading the AO. The area of                            concern was removed with a cold snare. Resection                            and retrieval were thought to have been complete.                             (Jar 3)                           Internal hemorrhoids were found during                            retroflexion. The hemorrhoids were small.                           The exam was otherwise without abnormality. Complications:            No immediate complications. Estimated blood loss:                            Minimal. Estimated Blood Loss:     Estimated blood loss was minimal. Impression:               - The examined portion of the ileum was normal.                           -  One diminutive polyp in the cecum, removed with a                            cold biopsy forceps. Resected and retrieved.                           - One 3 mm polyp in the cecum, removed with a cold                            snare. Resected and retrieved.                           - Focal area of normal variant mucosa vs. flat                            polyp in the cecum at the appendiceal orifice,                            removed with a cold snare, although clear borders                            hard to delineate as outlined                           - Internal hemorrhoids.                           - The examination was otherwise normal.                           No cause for pain on this exam. Recommendation:           - Patient has a contact number available for                            emergencies. The signs and symptoms of potential                            delayed complications were discussed with the                            patient. Return to normal activities tomorrow.                            Written discharge instructions were provided to the                            patient.                           - Resume previous diet.                           - Continue present medications.                           -  Await pathology results with further                            recommendations Remo Lipps P. Altair Appenzeller, MD 05/17/2020 5:14:15 PM This report has been signed electronically.

## 2020-05-17 NOTE — Patient Instructions (Signed)
Handouts provided on polyps, hemorrhoids and gastritis.   YOU HAD AN ENDOSCOPIC PROCEDURE TODAY AT [facility]:   Refer to the procedure report that was given to you for any specific questions about what was found during the examination.  If the procedure report does not answer your questions, please call your gastroenterologist to clarify.  If you requested that your care partner not be given the details of your procedure findings, then the procedure report has been included in a sealed envelope for you to review at your convenience later.  YOU SHOULD EXPECT: Some feelings of bloating in the abdomen. Passage of more gas than usual.  Walking can help get rid of the air that was put into your GI tract during the procedure and reduce the bloating. If you had a lower endoscopy (such as a colonoscopy or flexible sigmoidoscopy) you may notice spotting of blood in your stool or on the toilet paper. If you underwent a bowel prep for your procedure, you may not have a normal bowel movement for a few days.  Please Note:  You might notice some irritation and congestion in your nose or some drainage.  This is from the oxygen used during your procedure.  There is no need for concern and it should clear up in a day or so.  SYMPTOMS TO REPORT IMMEDIATELY:   Following lower endoscopy (colonoscopy or flexible sigmoidoscopy):  Excessive amounts of blood in the stool  Significant tenderness or worsening of abdominal pains  Swelling of the abdomen that is new, acute  Fever of 100F or higher   Following upper endoscopy (EGD)  Vomiting of blood or coffee ground material  New chest pain or pain under the shoulder blades  Painful or persistently difficult swallowing  New shortness of breath  Fever of 100F or higher  Black, tarry-looking stools  For urgent or emergent issues, a gastroenterologist can be reached at any hour by calling [phone]. Do not use MyChart messaging for urgent  concerns.    DIET:  We do recommend a small meal at first, but then you may proceed to your regular diet.  Drink plenty of fluids but you should avoid alcoholic beverages for 24 hours.  ACTIVITY:  You should plan to take it easy for the rest of today and you should NOT DRIVE or use heavy machinery until tomorrow (because of the sedation medicines used during the test).    FOLLOW UP: Our staff will call the number listed on your records 48-72 hours following your procedure to check on you and address any questions or concerns that you may have regarding the information given to you following your procedure. If we do not reach you, we will leave a message.  We will attempt to reach you two times.  During this call, we will ask if you have developed any symptoms of COVID 19. If you develop any symptoms (ie: fever, flu-like symptoms, shortness of breath, cough etc.) before then, please call [phone].  If you test positive for Covid 19 in the 2 weeks post procedure, please call and report this information to [place].    If any biopsies were taken you will be contacted by phone or by letter within the next 1-3 weeks.  Please call us at [phone] if you have not heard about the biopsies in 3 weeks.    SIGNATURES/CONFIDENTIALITY: You and/or your care partner have signed paperwork which will be entered into your electronic medical record.  These signatures attest  to the fact that that the information above on your After Visit Summary has been reviewed and is understood.  Full responsibility of the confidentiality of this discharge information lies with you and/or your care-partner.

## 2020-05-17 NOTE — Progress Notes (Signed)
Interpreter Ipad used to communicate with patient and her family during this visit, interpreter ID number is [id number]. AVS and handouts provided in English as there are no options for written translation in patient's preferred language. Also, patient is unable to read or write in her native language. Verbally reviewed in detail discharge instructions with the patient and her son and husband using the interpreter Ipad at the bed side. They all verbalized understanding. Patient stable and no complaints of pain at time of discharge. Patient did verbalize concern about calling the contact number provided on AVS due to language barrier. RN inquired if there was an interpreter line they could call to assist and patient states that "they are not allowed to do that." Not knowing any other options, RN instructed patient to seek care at the Emergency Department if she has any of the discussed symptoms so that she can be evaluated for complications using an interpreter. Patient and her family verbalized understanding.

## 2020-05-17 NOTE — Progress Notes (Signed)
A/ox3, pleased with MAC, report to RN 

## 2020-05-21 ENCOUNTER — Telehealth: Payer: Self-pay

## 2020-05-21 NOTE — Telephone Encounter (Signed)
pt had been instructed with interpreter due to language barrier if she had any problems, she was to go to the ER instead of calling or having a follow up call.

## 2020-05-21 NOTE — Telephone Encounter (Signed)
No one in household speaks [place], unable to do follow up call at this time, will see if interpreter is available and call back later.

## 2020-05-27 ENCOUNTER — Telehealth: Payer: Self-pay

## 2020-05-27 ENCOUNTER — Other Ambulatory Visit: Payer: Self-pay

## 2020-05-27 MED ORDER — PANTOPRAZOLE SODIUM 40 MG PO TBEC: 40.0000 mg | DELAYED_RELEASE_TABLET | Freq: Every day | ORAL | 2 refills | Status: DC

## 2020-05-27 NOTE — Telephone Encounter (Signed)
Spoke with patient regarding her pathology results via an interpreter. Pt states that she is still having abdominal pain, she reports that the pain is located in the stomach area, when she pushes the area she feels pain, she states that when she gets the abdominal pain she begins to sweat, and does not feel herself. Pt states that she is not constipated, she reports that she has BM everyday, has not had one today but she states that "it will come". Pt states that she does not eat much. Pt did want to proceed with Pantoprazole prescription - this has been sent to her pharmacy. Please advise, thank you

## 2020-05-27 NOTE — Telephone Encounter (Signed)
Thank you for calling her. Given the Dexilant helped her, yes let's try the protonix 40mg  / day for the next 4 weeks and see how it goes. If this relieves her symptoms will continue it. If no benefit after the course she should call us back. Thanks

## 2020-05-28 NOTE — Telephone Encounter (Signed)
Lm on vm for patient to return call via an interpreter.

## 2020-05-29 NOTE — Telephone Encounter (Signed)
Letter mailed to patient with recommendations.

## 2020-05-29 NOTE — Telephone Encounter (Signed)
Lm on vm for patient to return call via an interpreter.

## 2020-07-02 ENCOUNTER — Ambulatory Visit (HOSPITAL_COMMUNITY)
Admission: EM | Admit: 2020-07-02 | Discharge: 2020-07-02 | Disposition: A | Discharge status: Home / Self Care | Payer: BC Managed Care – PPO

## 2020-07-02 ENCOUNTER — Other Ambulatory Visit: Payer: Self-pay

## 2020-07-02 ENCOUNTER — Encounter (HOSPITAL_COMMUNITY): Payer: Self-pay

## 2020-07-02 DIAGNOSIS — K219 Gastro-esophageal reflux disease without esophagitis: Secondary | ICD-10-CM | POA: Diagnosis not present

## 2020-07-02 MED ORDER — FAMOTIDINE 20 MG PO TABS: 20.0000 mg | ORAL_TABLET | Freq: Two times a day (BID) | ORAL | 0 refills | Status: DC

## 2020-07-02 NOTE — ED Provider Notes (Signed)
[facility]    CSN: [id number] Arrival date & time: 07/02/20  1103      History   Chief Complaint Chief Complaint  Patient presents with  . Abdominal Pain    HPI Deborah Sampson is a 59 y.o. female Pt presents today with abdominal discomfort after eating for 3 months. She is a patient at [facility] and has been diagnosed with GERD there. this pt also has a distant history of h pylori infection. They did a Upper GI Endoscopy 03/2020 that was normal. They prescribed her with Omeprazole for GERD, which she has been taking with minimal improvement. Today, she presents with continued upper abdominal discomfort after eating, as well as burning in her throat. Denies changes in symptoms, but she is unhappy with her current treatment regimen. Denies hematuria, dysuria, frequency, urgency, back pain, n/v/d/abd pain, fevers/chills, abdnormal vaginal discharge. Denies changes in bowel movements, though she has a history of constipation- last bowel movement was yesterday and was normal. Denies blood in the toilet after bowel movement, denies blood on toilet paper, denies black and tarry stools.    HPI  History reviewed. No pertinent past medical history.  There are no problems to display for this patient.   History reviewed. No pertinent surgical history.  OB History   No obstetric history on file.      Home Medications    Prior to Admission medications   Medication Sig Start Date End Date Taking? Authorizing Provider  famotidine (PEPCID) 40 MG tablet Take 40 mg by mouth daily. 01/24/20   [provider]  sucralfate (CARAFATE) 1 g tablet Take by mouth. 04/05/20   [provider]  [place] [id number] MG TABS  05/16/20   [provider]    Family History No family history on file.  Social History     Allergies   Patient has no known allergies.   Review of Systems Review of Systems  Constitutional: Negative for chills and fever.   Respiratory: Negative for chest tightness and shortness of breath.   Cardiovascular: Negative for chest pain and leg swelling.  Gastrointestinal: Positive for abdominal distention, abdominal pain and constipation. Negative for blood in stool, diarrhea, nausea and vomiting.  Genitourinary: Negative for dysuria, flank pain, frequency and urgency.  All other systems reviewed and are negative.    Physical Exam Triage Vital Signs ED Triage Vitals  Enc Vitals Group     BP 07/02/20 1241 134/77     Pulse Rate 07/02/20 1241 72     Resp 07/02/20 1241 16     Temp 07/02/20 1241 97.6 F (36.4 C)     Temp Source 07/02/20 1241 Oral     SpO2 07/02/20 1241 100 %     Weight --      Height --      Head Circumference --      Peak Flow --      Pain Score 07/02/20 1249 8     Pain Loc --      Pain Edu? --      Excl. in [place]? --    No data found.  Updated Vital Signs BP 134/77 (BP Location: Right Arm)   Pulse 72   Temp 97.6 F (36.4 C) (Oral)   Resp 16   SpO2 100%   Visual Acuity Right Eye Distance:   Left Eye Distance:   Bilateral Distance:    Right Eye Near:   Left Eye Near:    Bilateral Near:  Physical Exam Vitals reviewed.  Constitutional:      General: She is not in acute distress.    Appearance: Normal appearance. She is well-developed. She is not ill-appearing.  HENT:     Head: Normocephalic and atraumatic.  Cardiovascular:     Rate and Rhythm: Normal rate and regular rhythm.     Heart sounds: Normal heart sounds.  Pulmonary:     Effort: Pulmonary effort is normal.     Breath sounds: Normal breath sounds. No wheezing, rhonchi or rales.  Abdominal:     General: Bowel sounds are normal. There is no distension.     Palpations: Abdomen is soft. There is no mass.     Tenderness: There is abdominal tenderness in the epigastric area. There is no right CVA tenderness, left CVA tenderness, guarding or rebound. Negative signs include Murphy's sign, Rovsing's sign and McBurney's  sign.     Comments: Bowel sounds positive in all 4 quadrants. Mild epigastric tenderness to palpation. Negative Murphy Sign, Rovsing's sign, McBurney point tenderness.   Neurological:     General: No focal deficit present.     Mental Status: She is alert and oriented to person, place, and time.  Psychiatric:        Mood and Affect: Mood normal.        Behavior: Behavior normal.      UC Treatments / Results  Labs (all labs ordered are listed, but only abnormal results are displayed) Labs Reviewed - No data to display  EKG   Radiology No results found.  Procedures Procedures (including critical care time)  Medications Ordered in UC Medications - No data to display  Initial Impression / Assessment and Plan / UC Course  I have reviewed the triage vital signs and the nursing notes.  Pertinent labs & imaging results that were available during my care of the patient were reviewed by me and considered in my medical decision making (see chart for details).     Pt with history of GERD, currently followed by [facility] for her symptoms. She was worked up there including upper endoscopy 03/2020. Currently taking prilosec for her symptoms with minimal improvement. Presenting today with continued upper abd discomfort after eating, as well as reflux symptoms. Today, rec trial of Pepcid in addition to concurrent prilosec. Stressed importance of following up with her GI doctor for further workup.  . Low risk of abd emergency, based on history and exam. Negative for perotineal signs. Rec symptomatic and supportive care, monitor for improvement in stools with increased oral intake.  Discussed strict return precautions- worsening abd pain, intractable n/v/d, inability to keep fluids down, new fevers, etc. Patient verbalized understanding and is agreeable with plan.    Final Clinical Impressions(s) / UC Diagnoses   Final diagnoses:  None   Discharge Instructions   None    ED  Prescriptions    None     PDMP not reviewed this encounter.   Hazel Sams, PA-C 07/02/20 1425

## 2020-07-02 NOTE — Discharge Instructions (Addendum)
For your abdominal symptoms, I've sent another medication that you can try. You can take this medication before a meal to help with your symptoms. Keep taking your Omeprazole as directed. You should make another appointment with your [facility], because they can order additional tests and prescribe treatments that we can't do at urgent care.

## 2020-07-02 NOTE — ED Triage Notes (Addendum)
Pt presents to UC today with c/o abdominal pain since October.  Of note, she had Upper GI endoscopy at [facility] clinic. She presents today because pain remains unresolved and she is feeling unwell. Pt takes omeprazole otc. VS are wdl. Pt denies being in contact with anyone ill and also denies any travel recently.

## 2020-08-26 ENCOUNTER — Telehealth: Payer: Self-pay | Admitting: Gastroenterology

## 2020-08-26 NOTE — Telephone Encounter (Signed)
Spoke with patient via Interpreter ID: [id number],  patient reports lower abdominal pain, describes as sharp pain, she states that it started after last appointment, patient states that she has finished the Pepcid, Carafate, and other prescribed medications, she states that she is not taking any medications at this time. No nausea and vomiting, pt reports constipation - she states that her last bowel movement was this morning but struggled to have a BM. Advised patient to try Miralax daily, patient is aware that she can titrate between 1-3 doses daily until her appt. Patient has been scheduled for a sooner appointment with Christianne Dolin on Thursday, 08/29/20 at 2 PM. Patient verbalized understanding of all information and had no questions or concerns at the end of the call.

## 2020-08-29 ENCOUNTER — Ambulatory Visit (INDEPENDENT_AMBULATORY_CARE_PROVIDER_SITE_OTHER): Payer: BC Managed Care – PPO | Admitting: Gastroenterology

## 2020-08-29 ENCOUNTER — Other Ambulatory Visit: Payer: Self-pay

## 2020-08-29 ENCOUNTER — Encounter: Payer: Self-pay | Admitting: Gastroenterology

## 2020-08-29 VITALS — BP 118/66 | HR 65 | Ht 64.5 in | Wt 177.4 lb

## 2020-08-29 DIAGNOSIS — R1013 Epigastric pain: Secondary | ICD-10-CM | POA: Insufficient documentation

## 2020-08-29 MED ORDER — DEXLANSOPRAZOLE 60 MG PO CPDR: 60.0000 mg | DELAYED_RELEASE_CAPSULE | Freq: Every day | ORAL | 3 refills | Status: DC

## 2020-08-29 NOTE — Patient Instructions (Signed)
If you are age 58 or older, your body mass index should be between 23-30. Your Body mass index is 29.98 kg/m. If this is out of the aforementioned range listed, please consider follow up with your Primary Care Provider.  If you are age 48 or younger, your body mass index should be between 19-25. Your Body mass index is 29.98 kg/m. If this is out of the aformentioned range listed, please consider follow up with your Primary Care Provider.   We have sent the following medications to your pharmacy for you to pick up at your convenience: Dexilant 60 mg daily.   Follow up with Dr. Havery [name] in 6 weeks.

## 2020-08-29 NOTE — Progress Notes (Signed)
Agree with assessment and plan as outlined.  

## 2020-08-29 NOTE — Progress Notes (Signed)
08/29/2020 Deborah Sampson [id number] 1961-07-09   HISTORY OF PRESENT ILLNESS: This is a 60 year old female who is a patient of Dr. Doyne Keel.  She is from [place] and is here today with a translator who helped during the entire visit.  She was seen by Dr. Havery [name] back in September 2021 at which time she was complaining of epigastric abdominal pain and constipation.  She underwent EGD and colonoscopy in October 2021:  EGD showed some erythematous mucosa in the gastric body.  Biopsies showed mild chronic gastritis with no evidence of H. pylori.  Colonoscopy revealed internal hemorrhoids and 2 polyps were removed, 1 which is an inflammatory polyp and the others were benign colonic mucosa.  In regards to the constipation she is taking MiraLAX 3 times a day and having regular bowel movements.  She still continues to have upper abdominal pain.  She says that the pain comes from both sides and ends in her epigastrium and then sometimes goes up into her chest as well.  She describes it as burning sensation.  She has some degree of discomfort all the time, but it definitely gets worse at certain point throughout the day.  She reports episodes of sweating when this occurs.  She has been on several acid reflux medicines including Dexilant, Pepcid, Carafate, and I believe pantoprazole as well.  One note reports that the Dexilant seemed to help, but then somewhere else that says that it did not help.  She cannot recall at this point.  Her weight is stable and in fact is exactly the same as it was in September 2021 when she was here.   Past Medical History:  Diagnosis Date  . Acute appendicitis 11/21/2018  . Back pain 11/27/2015  . H. pylori infection 07/25/2019  . Headache 12/15/2016  . Palpitations 11/27/2015  . Patient speaks only [place] 07/25/2019  . Refugee health examination 11/27/2015  . S/P laparoscopic appendectomy 11/21/2018  . Scars, cultural scarification 11/27/2015   Past Surgical  History:  Procedure Laterality Date  . LAPAROSCOPIC APPENDECTOMY N/A 11/21/2018   Procedure: APPENDECTOMY LAPAROSCOPIC;  Surgeon: Rolm Bookbinder, MD;  Location: [facility];  Service: General;  Laterality: N/A;    reports that she quit smoking about 6 years ago. Her smoking use included cigarettes. She has never used smokeless tobacco. She reports previous alcohol use. She reports that she does not use drugs. Family history is unknown by patient. Allergies  Allergen Reactions  . Penicillins Rash      Outpatient Encounter Medications as of 08/29/2020  Medication Sig  . polyethylene glycol (MIRALAX / GLYCOLAX) 17 g packet Take 17 g by mouth 3 (three) times daily.  . [DISCONTINUED] dexlansoprazole (DEXILANT) 60 MG capsule Take 1 capsule (60 mg total) by mouth daily.  . [DISCONTINUED] famotidine (PEPCID) 20 MG tablet Take 1 tablet (20 mg total) by mouth 2 (two) times daily.  . [DISCONTINUED] famotidine (PEPCID) 40 MG tablet Take 1 tablet (40 mg total) by mouth daily.  . [DISCONTINUED] guaiFENesin (COUGH SYRUP PO) Take 1 Dose by mouth as needed.  . [DISCONTINUED] pantoprazole (PROTONIX) 40 MG tablet Take 1 tablet (40 mg total) by mouth daily.  . [DISCONTINUED] Sodium Sulfate-Mag Sulfate-KCl (SUTAB) [phone] MG TABS Take 1 kit by mouth as directed. MANUFACTURER CODES!! BIN: [id number] PCN: CN GROUP: [id number] MEMBER ID: [id number];GQB AS CASH;NO PRIOR AUTHORIZATION  . [DISCONTINUED] sucralfate (CARAFATE) 1 g tablet Take 1 tablet (1 g total) by mouth every 6 (six) hours as needed.  . [DISCONTINUED] sucralfate (  CARAFATE) 1 g tablet Take by mouth.  . [DISCONTINUED] SUTAB [phone] MG TABS    No facility-administered encounter medications on file as of 08/29/2020.    REVIEW OF SYSTEMS  : All other systems reviewed and negative except where noted in the History of Present Illness.   PHYSICAL EXAM: BP 118/66   Pulse 65   Ht 5' 4.5" (1.638 m)   Wt 177 lb 6.4 oz (80.5 kg)   LMP 09/07/2018   BMI  29.98 kg/m  General: Well developed AA female in no acute distress Head: Normocephalic and atraumatic Eyes:  Sclerae anicteric, conjunctiva pink. Ears: Normal auditory acuity Lungs: Clear throughout to auscultation; no W/R/R. Heart: Regular rate and rhythm; no M/R/G. Abdomen: Soft, non-distended.  BS present.  Diffuse tenderness to palpation, but mostly in the epigastrium. Musculoskeletal: Symmetrical with no gross deformities  Skin: No lesions on visible extremities Extremities: No edema  Neurological: Alert oriented x 4, grossly non-focal Psychological:  Alert and cooperative. Normal mood and affect  ASSESSMENT AND PLAN: *60 year old female with complaints of abdominal pain that comes up from both sides of her abdomen and into her epigastrium and up into her chest as well.  She describes this as burning.  This sounds reflux related, but she has been on multiple reflux regimens with questionable efficacy.  At one point it was noted that the [place] seemed to help, but then somewhere else there was notes that it did not help.  I think that there is likely some miscommunication somewhere due to the language barrier.  We discussed performing CT scan of the abdomen and pelvis with contrast.  She wanted to hold off for now and try some other medication.  I am to put her back on Dexilant 60 mg daily for the next 4 to 6 weeks and see if that helps.  If symptoms continue and it does not help then she may need a CT scan.  I also considered putting her on an antispasmodic.  She does need follow-up in approximately 6 weeks.   CC:  No ref. provider found

## 2020-08-30 ENCOUNTER — Telehealth: Payer: Self-pay | Admitting: Gastroenterology

## 2020-08-30 NOTE — Addendum Note (Signed)
Addended by: Horris Latino on: 08/30/2020 08:53 AM   Modules accepted: Orders

## 2020-08-30 NOTE — Telephone Encounter (Signed)
PA pending

## 2020-08-30 NOTE — Telephone Encounter (Signed)
Inbound call from [place] stating Deborah Sampson medication requires prior authorization and patient is requesting something be sent to pharmacy while we wait for response from insurance please.

## 2020-09-03 MED ORDER — OMEPRAZOLE 40 MG PO CPDR: 40.0000 mg | DELAYED_RELEASE_CAPSULE | Freq: Two times a day (BID) | ORAL | 3 refills | Status: DC

## 2020-09-03 NOTE — Telephone Encounter (Signed)
Can we try for Prilosec 40 mg twice daily?

## 2020-09-03 NOTE — Telephone Encounter (Signed)
Dexilant is a plan exclusion and was denied. Please advise on different PPI.

## 2020-09-03 NOTE — Telephone Encounter (Signed)
Script sent to pharmacy.

## 2020-09-30 NOTE — Progress Notes (Unsigned)
SUBJECTIVE:   CHIEF COMPLAINT / HPI:  In person interpretor Jocelyn used for entirety of visit.  Rectal pain - just with defecation, past four months, when she wipes sometimes feels irritated. Wondering if she has a hemorrhoid. Has not taken an oral medications or cream for it. Constipation as below not currently taking any medications. colonoscopy 05/17/2020 with three polyps removed all benign and not due for another for 10 years. Internal hemorrhoids noted on colonoscopy, not enlarged.  Chronic GERD- started on Prilosec 40mg  BID, only taking once a day per patient report. follows with GI Dr Myrtice Lauth, EGD 05/17/20 with erythematous mucosa of gastric body with biopsies negative for H pylori, which she had a history of and had been treated for. Notes she sometimes has dry mouth or south taste in her mouth and feels like she needs to scrape it off her tongue, no ulcer in mouth, no mouth pain with eating.   Constipation- every stool in hard, no blood in stool, goes every day but hard. Tried Miralax in the past three times a day but didn't help. Sometimes drinks 3 glasses of water per day sometimes doesn't because she isn't allowed to take many bathroom breaks at work, she works at the [employer].  Elevated blood pressure- she denies a history of elevated blood pressure. Repeat in office after sitting in 146/92. No headaches.  Eyelid swelling- on R side, started three days ago, sometimes some matted eyelid in morning, no pain with eye movement, no purulent drainage, not itching. No vision changes.  PERTINENT  PMH / PSH: as above  OBJECTIVE:   BP (!) 146/92   Pulse 71   Ht 5' 4.5" (1.638 m)   Wt 178 lb (80.7 kg)   LMP 09/07/2018   SpO2 99%   BMI 30.08 kg/m   General: A&O, NAD HEENT: No sign of trauma, PERRL, mild swelling of right upper eyelid without lesions, laceration, pimple. No corneal defect intact light reflex appreciated. EOM intact without pain. Sclera non-injected. tongue  without lesions, oral mucosa and palate without ulcers and lesions, no sign of dental infection Cardiac: RRR, no m/r/g Respiratory: CTAB, normal WOB, no w/c/r GI: Soft, NTTP, non-distended  Rectal: small skin tag at 6 o'clock position, no fissures or external hemorrhoids appreciated. Digital rectal exam without mass palpated and no gross blood appreciated. Extremities: NTTP, no peripheral edema. Neuro: Normal gait, moves all four extremities appropriately. Psych: Appropriate mood and affect  Jocelyn Uwase interpretor present as chaperone for rectal exam.  ASSESSMENT/PLAN:   Single episode of elevated blood pressure - per chart review usually normotensive, still mildly elevated on repeat today, will recheck at f/u appt in 1 month  Chronic constipation - without red flag signs/sx, recent colonoscopy, suspect rectal pain is related to this as there is no signs of hemorrhoid or fissure on exam, restarted BID Miralax with BID colace, recommend increase water intake, high fiber diet  Gastroesophageal reflux disease - suspect this is contributing to sour taste in mouth, no abnormalities on exam, increase PPI to BID per GI recs  Blepharitis of right upper eyelid - on right eye, no signs of secondary bacterial infection or cellulitis, no  erythema or warmth, no pain with eye movement - discussed warm compresses, and eye drops OTC lubricant - discussed reasons to return or go to ED including worsening swelling, fevers, chills, pain with eye movement, changes in vision     Lenoria Chime, MD [place]

## 2020-10-01 ENCOUNTER — Ambulatory Visit: Payer: BC Managed Care – PPO | Admitting: Student in an Organized Health Care Education/Training Program

## 2020-10-02 ENCOUNTER — Encounter: Payer: Self-pay | Admitting: Family Medicine

## 2020-10-02 ENCOUNTER — Ambulatory Visit (INDEPENDENT_AMBULATORY_CARE_PROVIDER_SITE_OTHER): Payer: BC Managed Care – PPO | Admitting: Family Medicine

## 2020-10-02 ENCOUNTER — Other Ambulatory Visit: Payer: Self-pay

## 2020-10-02 VITALS — BP 146/92 | HR 71 | Ht 64.5 in | Wt 178.0 lb

## 2020-10-02 DIAGNOSIS — R030 Elevated blood-pressure reading, without diagnosis of hypertension: Secondary | ICD-10-CM | POA: Diagnosis not present

## 2020-10-02 DIAGNOSIS — K5909 Other constipation: Secondary | ICD-10-CM

## 2020-10-02 DIAGNOSIS — K219 Gastro-esophageal reflux disease without esophagitis: Secondary | ICD-10-CM

## 2020-10-02 DIAGNOSIS — H01001 Unspecified blepharitis right upper eyelid: Secondary | ICD-10-CM | POA: Diagnosis not present

## 2020-10-02 MED ORDER — DOCUSATE SODIUM 100 MG PO CAPS: 100.0000 mg | ORAL_CAPSULE | Freq: Two times a day (BID) | ORAL | 2 refills | Status: DC

## 2020-10-02 MED ORDER — POLYETHYLENE GLYCOL 3350 17 GM/SCOOP PO POWD: 17.0000 g | Freq: Two times a day (BID) | ORAL | 1 refills | Status: DC | PRN

## 2020-10-02 NOTE — Assessment & Plan Note (Signed)
-   without red flag signs/sx, recent colonoscopy, suspect rectal pain is related to this as there is no signs of hemorrhoid or fissure on exam, restarted BID Miralax with BID colace, recommend increase water intake, high fiber diet

## 2020-10-02 NOTE — Assessment & Plan Note (Signed)
-   per chart review usually normotensive, still mildly elevated on repeat today, will recheck at f/u appt in 1 month

## 2020-10-02 NOTE — Assessment & Plan Note (Signed)
-   on right eye, no signs of secondary bacterial infection or cellulitis, no  erythema or warmth, no pain with eye movement - discussed warm compresses, and eye drops OTC lubricant - discussed reasons to return or go to ED including worsening swelling, fevers, chills, pain with eye movement, changes in vision

## 2020-10-02 NOTE — Assessment & Plan Note (Signed)
-   suspect this is contributing to sour taste in mouth, no abnormalities on exam, increase PPI to BID per GI recs

## 2020-10-02 NOTE — Patient Instructions (Addendum)
It was wonderful to see you today.  Please bring ALL of your medications with you to every visit.   Today we talked about:  -  Increase your Prilosec to twice a day- this may help with your impaired taste - Restart Miralax 1 capfull twice daily, and Colace 100 mg twice daily - F/u in 2-3 months to recheck how you are doing, or sooner if needed - Warm compresses and pick up over the counter artifical tears or lubricant eye drops for dry eye several times a day as needed- reasons to come back include changes in vision, eyelid swelling, pain with eye movement, purulent discharge    Thank you for choosing [facility].   Please call [phone] with any questions about today's appointment.  Please be sure to schedule follow up at the front  desk before you leave today.   Yehuda Savannah, MD  Family Medicine

## 2020-10-03 ENCOUNTER — Ambulatory Visit: Payer: BC Managed Care – PPO | Admitting: Gastroenterology

## 2020-10-17 ENCOUNTER — Encounter: Payer: Self-pay | Admitting: Gastroenterology

## 2020-10-17 ENCOUNTER — Ambulatory Visit (INDEPENDENT_AMBULATORY_CARE_PROVIDER_SITE_OTHER): Payer: BC Managed Care – PPO | Admitting: Gastroenterology

## 2020-10-17 ENCOUNTER — Other Ambulatory Visit (INDEPENDENT_AMBULATORY_CARE_PROVIDER_SITE_OTHER): Payer: BC Managed Care – PPO

## 2020-10-17 VITALS — BP 126/70 | HR 76 | Ht 64.5 in | Wt 175.5 lb

## 2020-10-17 DIAGNOSIS — K5900 Constipation, unspecified: Secondary | ICD-10-CM

## 2020-10-17 DIAGNOSIS — R1013 Epigastric pain: Secondary | ICD-10-CM

## 2020-10-17 DIAGNOSIS — K219 Gastro-esophageal reflux disease without esophagitis: Secondary | ICD-10-CM

## 2020-10-17 DIAGNOSIS — R61 Generalized hyperhidrosis: Secondary | ICD-10-CM | POA: Diagnosis not present

## 2020-10-17 LAB — CBC WITH DIFFERENTIAL/PLATELET
Basophils Absolute: 0 10*3/uL (ref 0.0–0.1)
Basophils Relative: 0.8 % (ref 0.0–3.0)
Eosinophils Absolute: 0.1 10*3/uL (ref 0.0–0.7)
Eosinophils Relative: 1.3 % (ref 0.0–5.0)
HCT: 40.9 % (ref 36.0–46.0)
Hemoglobin: 13.9 g/dL (ref 12.0–15.0)
Lymphocytes Relative: 45 % (ref 12.0–46.0)
Lymphs Abs: 1.9 10*3/uL (ref 0.7–4.0)
MCHC: 34 g/dL (ref 30.0–36.0)
MCV: 83 fl (ref 78.0–100.0)
Monocytes Absolute: 0.3 10*3/uL (ref 0.1–1.0)
Monocytes Relative: 7.7 % (ref 3.0–12.0)
Neutro Abs: 1.9 10*3/uL (ref 1.4–7.7)
Neutrophils Relative %: 45.2 % (ref 43.0–77.0)
Platelets: 259 10*3/uL (ref 150.0–400.0)
RBC: 4.93 Mil/uL (ref 3.87–5.11)
RDW: 13.1 % (ref 11.5–15.5)
WBC: 4.2 10*3/uL (ref 4.0–10.5)

## 2020-10-17 NOTE — Patient Instructions (Addendum)
If you are age 58 or older, your body mass index should be between 23-30. Your Body mass index is 29.66 kg/m. If this is out of the aforementioned range listed, please consider follow up with your Primary Care Provider.  If you are age 31 or younger, your body mass index should be between 19-25. Your Body mass index is 29.66 kg/m. If this is out of the aformentioned range listed, please consider follow up with your Primary Care Provider.   Please go to the lab in the basement of our building to have lab work done as you leave today. Hit "B" for basement when you get on the elevator.  When the doors open the lab is on your left.  We will call you with the results. Thank you.  Due to recent changes in healthcare laws, you may see the results of your imaging and laboratory studies on MyChart before your provider has had a chance to review them.  We understand that in some cases there may be results that are confusing or concerning to you. Not all laboratory results come back in the same time frame and the provider may be waiting for multiple results in order to interpret others.  Please give [place] 48 hours in order for your provider to thoroughly review all the results before contacting the office for clarification of your results.   Continue Dexilant  Continue Miralax  Please follow up with your Primary Care Provider regarding your nighttime sweating.  Thank you for entrusting me with your care and for choosing [facility], Dr. Goodridge Cellar

## 2020-10-17 NOTE — Progress Notes (Signed)
HPI :  60 y/o female here for follow up visit for GERD, epigastric pain, constipation. She has been seen here in the past on a few occasions for this. Translator present for visit today. She has a history of H pylori s/p treatment in 2020 when she had a positive breath test. She had an unrevealing EGD in 04/2020, biopsies negative for H pylori. She has bee non a variety of antacids in the past for this issue, omeprazole, protonix, pepcid, and dexilant. She states Dexilant has helped her more than anything. She had this resumed in by Alonza Bogus in 08/2020 and has been taking it daily since then. She states it has controlled her GERD and epigastric pain, this is no longer bothering her. She is eating okay. No abdominal pains. She also has a history of constipation. Colonoscopy in 2021 did not show any pre-cancerous polyps or concerning findings. She has been on Miralax since her last visit and this is working really well for her. Denies problems with constipation right now.  Her main complaint is having "excessive sweating", often bothers her at night, but sometimes during day. Ongoing since March. No weight loss or fevers reported. No other associated symptoms.   No family history of gastric cancer or colon cancer.  Prior imaging: CT scan abdomen / pelvis 11/21/18 - acute appendicitis, moderate to severe spinal stenosis   EGD 05/17/20 -  - The exam of the esophagus was otherwise normal. - Patchy mildly erythematous mucosa was found in the gastric body without focal ulceration. - The exam of the stomach was otherwise normal. - Biopsies were taken with a cold forceps in the gastric body, at the incisura and in the gastric antrum for Helicobacter pylori testing. - The duodenal bulb and second portion of the duodenum were normal.  Colonoscopy 05/17/20 - The perianal and digital rectal examinations were normal. - The terminal ileum appeared normal. - A diminutive polyp was found in the cecum.  The polyp was sessile. The polyp was removed with a cold biopsy forceps, it could not be grasped with the snare. Resection and retrieval were complete. (Jar 2) - A 3 mm polyp was found in the cecum. The polyp was sessile. The polyp was removed with a cold snare. Resection and retrieval were complete. (Jar 2) - Altered mucosa was found in the cecum near appendiceal orifice. Unclear if this represents a focal normal variant vs. flat polyp, clear borders were hard to delineate on white light and NBI, did not appear to be invading the AO. The area of concern was removed with a cold snare. Resection and retrieval were thought to have been complete. (Jar 3) - Internal hemorrhoids were found during retroflexion. The hemorrhoids were small. - The exam was otherwise without abnormality.  1. Surgical [P], gastric antrum and gastric body - MILD CHRONIC GASTRITIS WITH REACTIVE CHANGES - NO H. PYLORI OR INTESTINAL METAPLASIA IDENTIFIED - SEE COMMENT 2. Surgical [P], colon, cecum, polyps (2) - BENIGN COLONIC MUCOSA (2 OF 2 FRAGMENTS) - NO HIGH GRADE DYSPLASIA OR MALIGNANCY IDENTIFIED - SEE COMMENT 3. Surgical [P], colon, cecum, polyp (1) - INFLAMMATORY POLYP - NO HIGH GRADE DYSPLASIA OR MALIGNANCY IDENTIFIED - SEE COMMENT    Past Medical History:  Diagnosis Date  . Acute appendicitis 11/21/2018  . Back pain 11/27/2015  . GERD (gastroesophageal reflux disease)   . H. pylori infection 07/25/2019  . Headache 12/15/2016  . Palpitations 11/27/2015  . Patient speaks only [place] 07/25/2019  . Refugee health  examination 11/27/2015  . S/P laparoscopic appendectomy 11/21/2018  . Scars, cultural scarification 11/27/2015     Past Surgical History:  Procedure Laterality Date  . LAPAROSCOPIC APPENDECTOMY N/A 11/21/2018   Procedure: APPENDECTOMY LAPAROSCOPIC;  Surgeon: Rolm Bookbinder, MD;  Location: [facility];  Service: General;  Laterality: N/A;   Family History  Family history unknown: Yes   Social  History   Tobacco Use  . Smoking status: Former Smoker    Types: Cigarettes    Quit date: 04/05/2014    Years since quitting: 6.5  . Smokeless tobacco: Never Used  Vaping Use  . Vaping Use: Never used  Substance Use Topics  . Alcohol use: Not Currently  . Drug use: Never   Current Outpatient Medications  Medication Sig Dispense Refill  . dexlansoprazole (DEXILANT) 60 MG capsule Take 60 mg by mouth daily.    . polyethylene glycol powder (GLYCOLAX/MIRALAX) 17 GM/SCOOP powder Take 17 g by mouth 2 (two) times daily as needed. 3350 g 1   No current facility-administered medications for this visit.   Allergies  Allergen Reactions  . Penicillins Rash     Review of Systems: All systems reviewed and negative except where noted in HPI.   Lab Results  Component Value Date   WBC 5.4 04/05/2020   HGB 14.2 04/05/2020   HCT 41.7 04/05/2020   MCV 83.4 04/05/2020   PLT 251.0 04/05/2020    Lab Results  Component Value Date   CREATININE 0.79 04/05/2020   BUN 14 04/05/2020   NA 138 04/05/2020   K 4.4 04/05/2020   CL 101 04/05/2020   CO2 32 04/05/2020    Lab Results  Component Value Date   ALT 22 04/05/2020   AST 16 04/05/2020   ALKPHOS 82 04/05/2020   BILITOT 0.4 04/05/2020     Physical Exam: BP 126/70 (BP Location: Left Arm, Patient Position: Sitting, Cuff Size: Normal)   Pulse 76   Ht 5' 4.5" (1.638 m)   Wt 175 lb 8 oz (79.6 kg)   LMP 09/07/2018   BMI 29.66 kg/m  Constitutional: Pleasant,well-developed, female in no acute distress. Neurological: Alert and oriented to person place and time. Psychiatric: Normal mood and affect. Behavior is normal.   ASSESSMENT AND PLAN: 60 y/o female here for reassessment of the following:  GERD / epigastric pain - history of H pylori that has been treated and eradicated, EGD otherwise without concerning findings. Labs reassuring. She has been on a variety of PPIs / antacids as outlined, Dexilant clearly has worked best for her to  control her symptoms. She is doing well at this time, now asymptomatic. I have counseled her on risks / benefits of long term PPIs, recommend lowest daily dose needed to control symptoms. She may try weaning dose and taking this every other day, up to her how often she wants to take it in regards to how much these symptoms bother her, but reassured her of workup to date. She can follow up PRN  Constipation - doing well with Miralax. Colonoscopy UTD. Continue Miralax, follow up PRN  Excessive sweating - not sure if related to menopause? Will check CBC to make sure stable, recommend she follow up with her PCP about this otherwise, she agreed.  [place] Cellar, MD [facility]

## 2020-10-30 NOTE — Progress Notes (Deleted)
    SUBJECTIVE:   CHIEF COMPLAINT / HPI:   ***  PERTINENT  PMH / PSH: ***  OBJECTIVE:   LMP 09/07/2018   ***  ASSESSMENT/PLAN:   No problem-specific Assessment & Plan notes found for this encounter.     Lenoria Chime, MD [place]   {    This will disappear when note is signed, click to select method of visit    :1}

## 2020-10-31 ENCOUNTER — Ambulatory Visit: Payer: BC Managed Care – PPO | Admitting: Family Medicine

## 2020-11-22 ENCOUNTER — Other Ambulatory Visit: Payer: Self-pay

## 2020-11-22 ENCOUNTER — Encounter: Payer: Self-pay | Admitting: Family Medicine

## 2020-11-22 ENCOUNTER — Ambulatory Visit (INDEPENDENT_AMBULATORY_CARE_PROVIDER_SITE_OTHER): Payer: BC Managed Care – PPO | Admitting: Family Medicine

## 2020-11-22 VITALS — BP 110/62 | HR 67 | Ht 64.5 in | Wt 181.0 lb

## 2020-11-22 DIAGNOSIS — R238 Other skin changes: Secondary | ICD-10-CM

## 2020-11-22 DIAGNOSIS — Z1231 Encounter for screening mammogram for malignant neoplasm of breast: Secondary | ICD-10-CM

## 2020-11-22 DIAGNOSIS — L819 Disorder of pigmentation, unspecified: Secondary | ICD-10-CM | POA: Diagnosis not present

## 2020-11-22 DIAGNOSIS — R030 Elevated blood-pressure reading, without diagnosis of hypertension: Secondary | ICD-10-CM

## 2020-11-22 MED ORDER — WHITE PETROLATUM EX OINT: 1.0000 "application " | TOPICAL_OINTMENT | Freq: Every day | CUTANEOUS | 0 refills | Status: DC

## 2020-11-22 NOTE — Progress Notes (Signed)
    SUBJECTIVE:   CHIEF COMPLAINT / HPI: skin changes  Elevated blood pressure at last visit - repeat BP normal today.  Itching skin- scratching them, skin itches, been several months. Was previously given a cream that helped. Just on both shins, they feel hot sometimes. No other skins spots, no rashes. No pain in the legs. No pain with walking. Never had a biopsy of the skin. No numbness of the skin.  Per chart review, on 03/15/2017 office visit with pictures looking similar today, was given a steroid cream at that appointment.  Has had two COVID vaccines.  PERTINENT  PMH / PSH: as above  OBJECTIVE:   BP 110/62   Pulse 67   Ht 5' 4.5" (1.638 m)   Wt 181 lb (82.1 kg)   LMP 09/07/2018   SpO2 98%   BMI 30.59 kg/m   Gen: 60 year old female in NAD, resting comfortably Skin: warm, dry, scattered hypopigmented lesions that are well circumscribed from the bilateral knees down to the anterior surfaces of the feet, none on upper thighs or arms, normal sensation of these, no overlying scale or rings around lesions (photo in media tab)  ASSESSMENT/PLAN:   Hypopigmentation - does not appear typical for leprosy, question if related to vitiligo vs previous scarring, she notes some itching but no overlying scale or signs of xerosis of eczematous changes - given vaseline to help if dry skin contributing, scheduled for biopsy as she states this is bothering her and would like to know what it is, do not want to do prolonged steroid creams without diagnosis due to risk of further hypopigmentation which I explained to her today, she is agreeable to this plan  Single episode of elevated blood pressure - discussed normal BP today, and normal on previous BP readings looking back besides our appointment on 10/02/2020, not meeting criteria for HTN and will continue to monitor at our follow up appointments.     Lenoria Chime, MD [place]

## 2020-11-22 NOTE — Patient Instructions (Addendum)
It was wonderful to see you today.  Please bring ALL of your medications with you to every visit.   Today we talked about:  - Scheduling an appointment for derm clinic to consider biopsy for hypopigmented skin lesions - Vaseline use daily on shins to help with any dry skin - We will call you to schedule a mammogram  Thank you for choosing [facility].   Please call [phone] with any questions about today's appointment.  Please be sure to schedule follow up at the front  desk before you leave today.   Yehuda Savannah, MD  Family Medicine

## 2020-11-23 NOTE — Assessment & Plan Note (Signed)
-   discussed normal BP today, and normal on previous BP readings looking back besides our appointment on 10/02/2020, not meeting criteria for HTN and will continue to monitor at our follow up appointments.

## 2020-11-23 NOTE — Assessment & Plan Note (Signed)
-   does not appear typical for leprosy, question if related to vitiligo vs previous scarring, she notes some itching but no overlying scale or signs of xerosis of eczematous changes - given vaseline to help if dry skin contributing, scheduled for biopsy as she states this is bothering her and would like to know what it is, do not want to do prolonged steroid creams without diagnosis due to risk of further hypopigmentation which I explained to her today, she is agreeable to this plan

## 2020-12-04 IMAGING — CT CT ABDOMEN AND PELVIS WITH CONTRAST
2 of 5 series · 16 of 46 positions shown, 18 images · IV contrast (APPLIED)
Comparison: CT scan dated 12/22/2016

CLINICAL DATA: Right lower quadrant abdominal pain.

EXAM:
CT ABDOMEN AND PELVIS WITH CONTRAST
TECHNIQUE: Multidetector CT imaging of the abdomen and pelvis was performed
using the standard protocol following bolus administration of
intravenous contrast.
CONTRAST:  100mL OMNIPAQUE IOHEXOL 300 MG/ML  SOLN

[Series 3: abd/ pelvis 5.0 i30f 2 · axial · 0.87mm/px · z∈[-610,-220]mm · 13 of 88 slices shown, 15 images]
[im 5/88  soft-tissue]
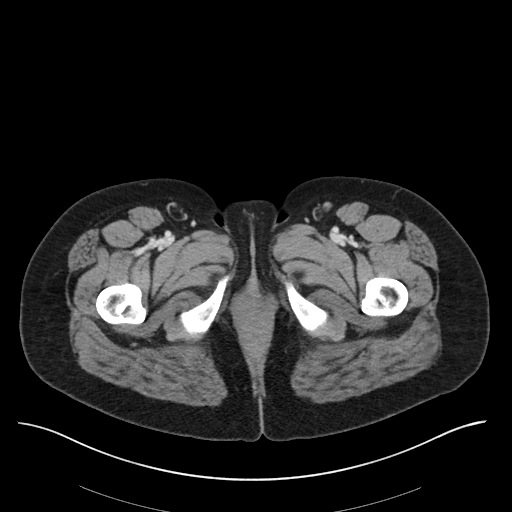
[im 5/88  bone]
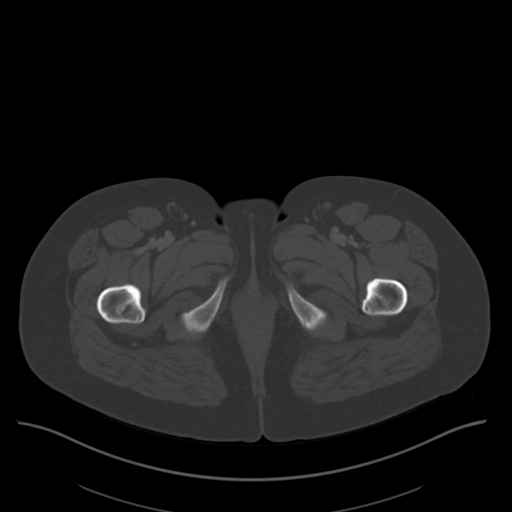
[im 13/88  soft-tissue]
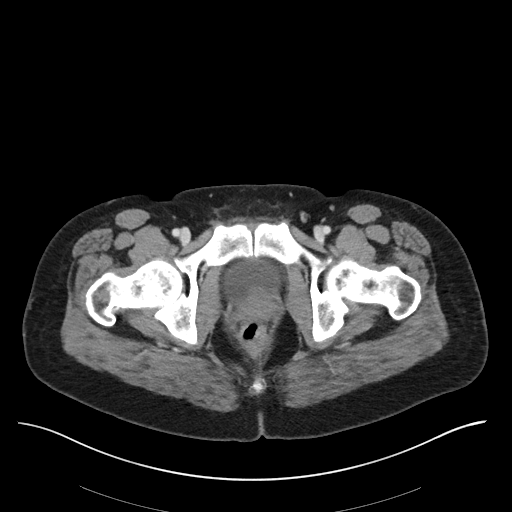
[im 17/88  soft-tissue]
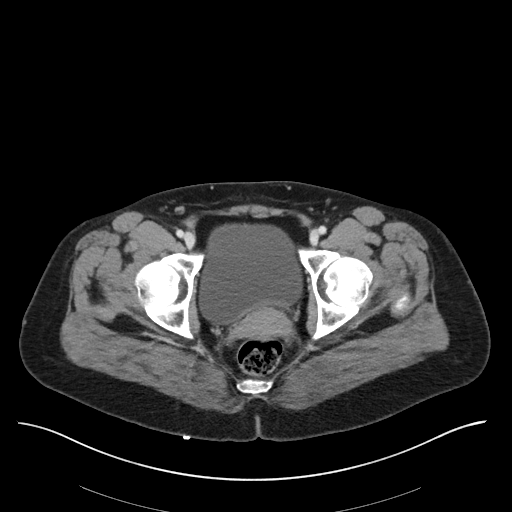
[im 25/88  soft-tissue]
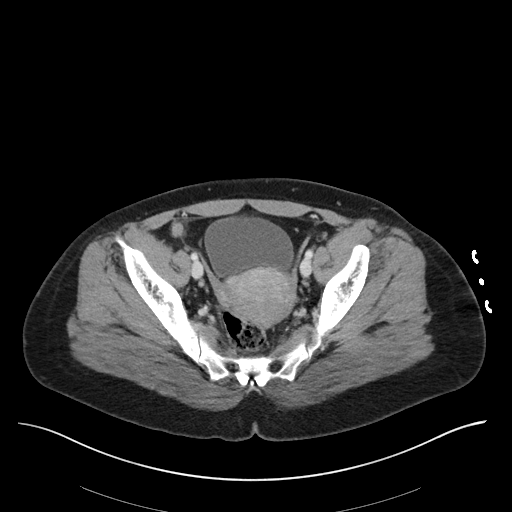
[im 30/88  soft-tissue]
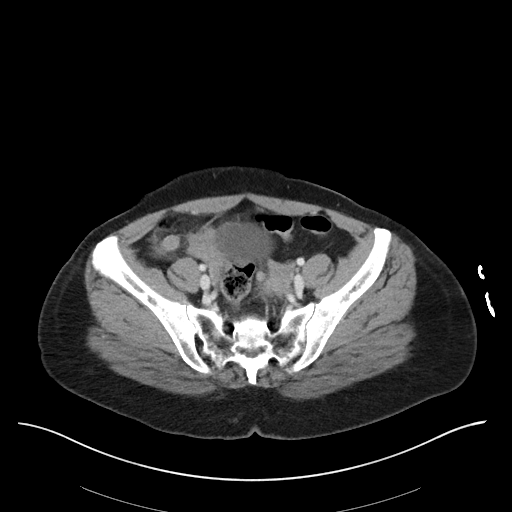
[im 38/88  soft-tissue]
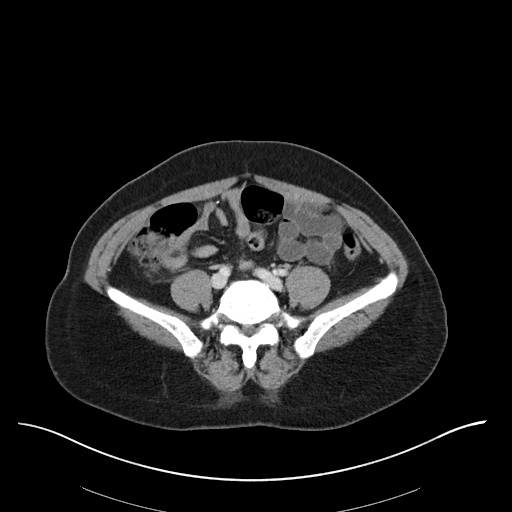
[im 46/88  soft-tissue]
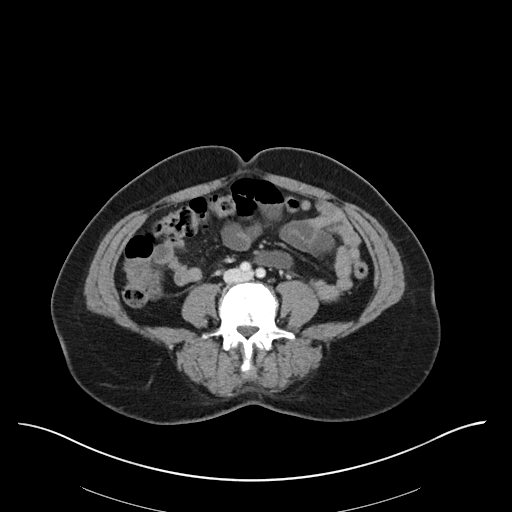
[im 50/88  soft-tissue]
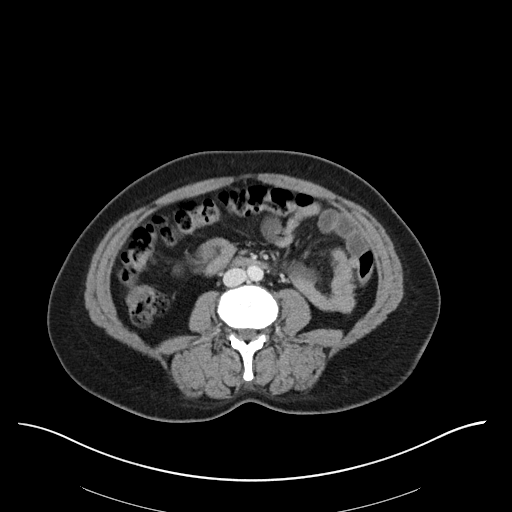
[im 59/88  soft-tissue]
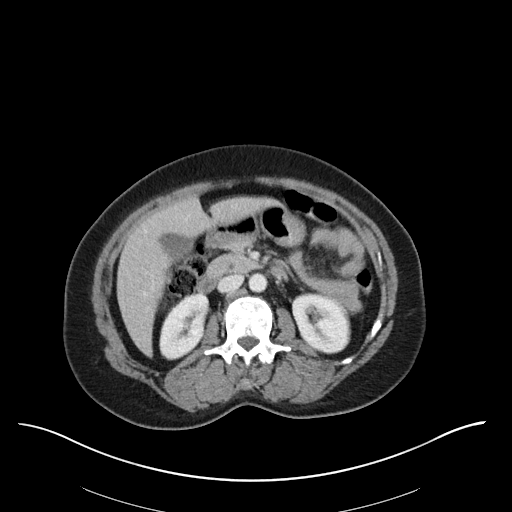
[im 59/88  bone]
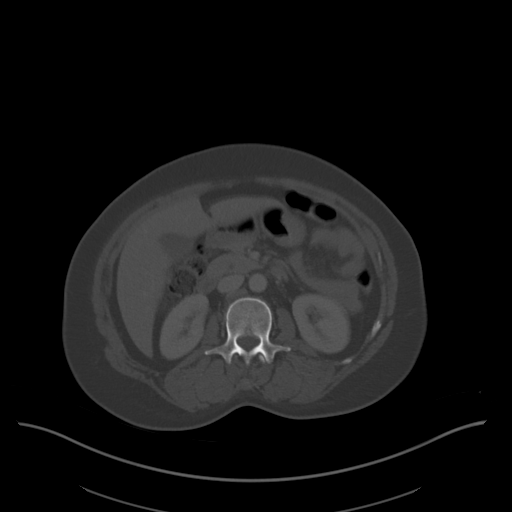
[im 63/88  soft-tissue]
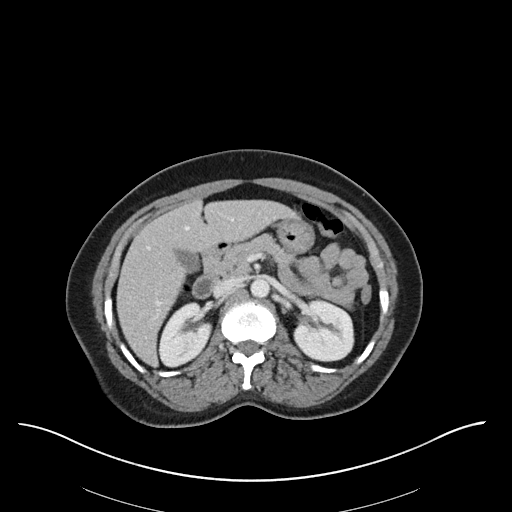
[im 71/88  soft-tissue]
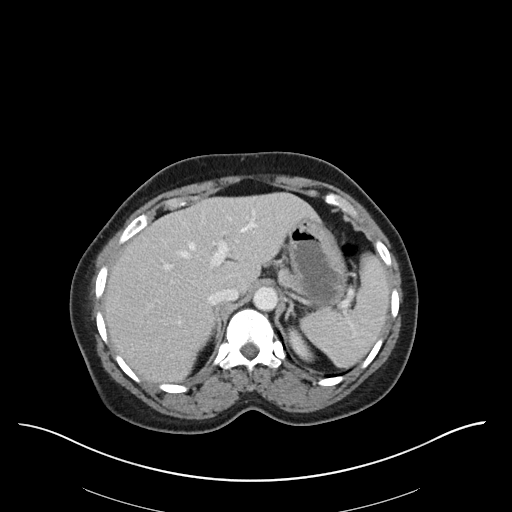
[im 75/88  soft-tissue]
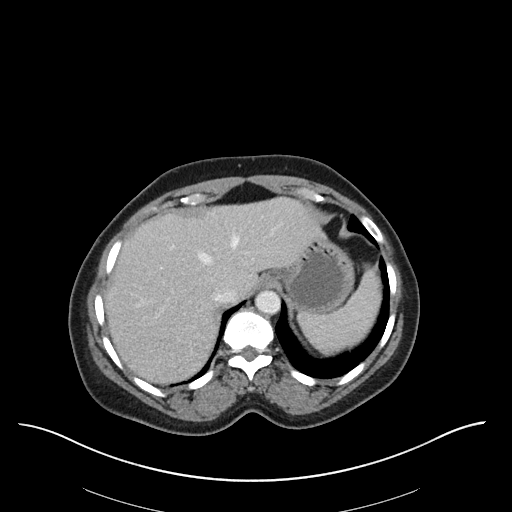
[im 83/88  soft-tissue]
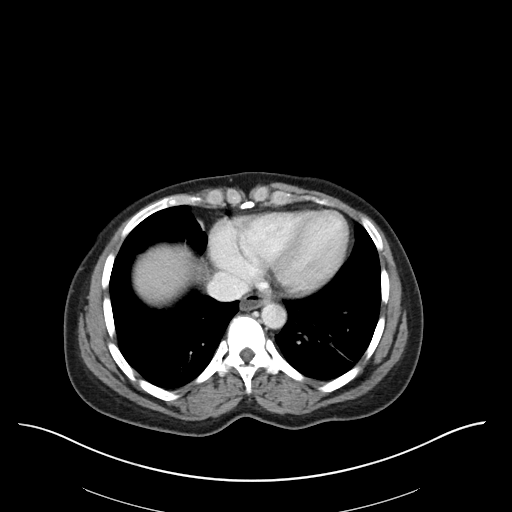

[Series 6: coronal soft tissue · coronal · 0.85mm/px · 3 of 102 slices shown]
[im 34/102  soft-tissue]
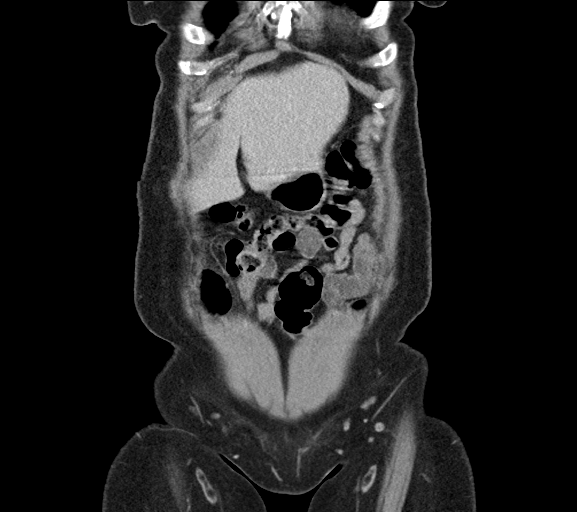
[im 45/102  soft-tissue]
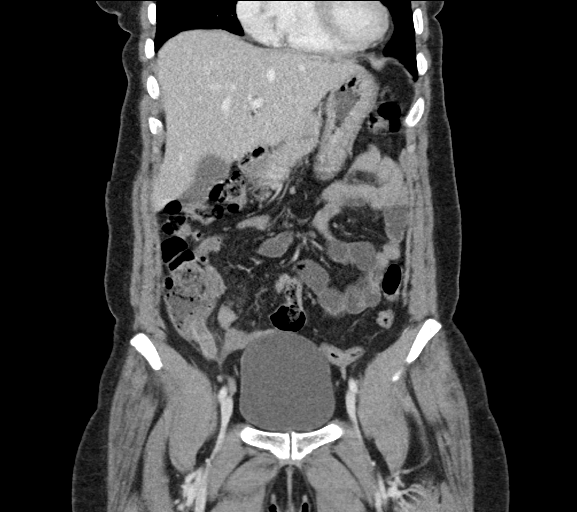
[im 57/102  soft-tissue]
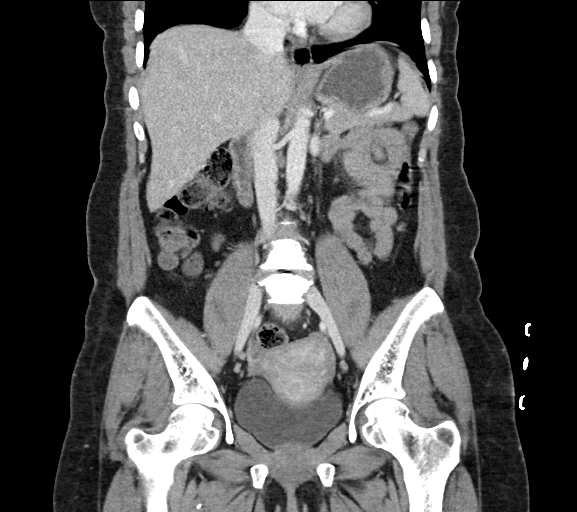

[16 of 46 positions shown; findings below may reference images not displayed]

FINDINGS: Lower chest: Minimal linear scarring at the left lung base.
Otherwise normal.

Hepatobiliary: No focal liver abnormality is seen. No gallstones,
gallbladder wall thickening, or biliary dilatation.

Pancreas: Unremarkable. No pancreatic ductal dilatation or
surrounding inflammatory changes.

Spleen: Normal in size without focal abnormality.

Adrenals/Urinary Tract: Adrenal glands are normal. Fourteen and 13
mm simple appearing cyst in the upper pole of the otherwise normal
left kidney. 9 mm cyst in the mid right kidney. The right kidney is
otherwise normal. No hydronephrosis. Bladder appears normal.

Stomach/Bowel: The appendix is dilated to 14 mm with periappendiceal
inflammation. The stomach and small bowel and the remainder of the
colon are normal.

Vascular/Lymphatic: No significant vascular findings are present. No
enlarged abdominal or pelvic lymph nodes.

Reproductive: Uterus and bilateral adnexa are unremarkable.

Other: No abdominal wall hernia or abnormality. No abdominopelvic
ascites.

Musculoskeletal: No acute abnormality. Moderate degenerative disc
disease at L4-5 with a broad-based disc protrusion with hypertrophy
of the ligamentum flavum and facet joints creating moderately severe
compression of the thecal sac.
IMPRESSION: 1. Acute appendicitis.
2. Moderately severe spinal stenosis at L4-5.
3. Critical Value/emergent results were called by telephone at the
time of interpretation on 11/21/2018 at [DATE] to Mr. Junichi, PA, who
verbally acknowledged these results.

## 2020-12-19 ENCOUNTER — Ambulatory Visit: Payer: BC Managed Care – PPO

## 2021-01-01 NOTE — Progress Notes (Signed)
    SUBJECTIVE:   CHIEF COMPLAINT / HPI: hypopigmented lesions b/l shins  Hypopigmentation: 60 yo patient presents today for hypopigmented lesions on her b/l anterior LE's. She states these lesions have been present for 8 months, associated sx include pruritus. She denies redness, swelling, weeping, blisters, fever etc. She has so far tried treatment with triamcinolone cream in the past, which did not help. Most recently she has used vaseline only, which helped with pruritus. She is not taking any medications by mouth or supplements. She uses ivory soap and has not changed any recent personal care products. She is not interested in biopsy today for diagnosis.  Interpreter, Lemmie Evens, present for entire visit  PERTINENT  PMH / PSH: GERD/PUD, [place] speaker.   OBJECTIVE:   BP 128/66   Pulse 73   Ht 5\' 5"  (1.651 m)   Wt 177 lb 6.4 oz (80.5 kg)   LMP 09/07/2018   SpO2 98%   BMI 29.52 kg/m   Nursing note and vitals reviewed GEN: age appropriate African woman, resting comfortably in chair, NAD, overweight Skin: diffuse hypopigmentation scattered across anterior aspects of b/l LE's distal to knees Neuro: Alert and at baseline Ext: no edema, 2+ PD  Psych: Pleasant and appropriate   ASSESSMENT/PLAN:   Hypopigmentation 60 yo woman with 8 months of pruritic hypopigmentation of b/l lower legs. Pruritus has improved with vaseline, does not have full depigmentatio making vitiligo less likely. DDx is very broad, less likely to be discoid SLE or sarcoidosis. Patient does not want a biopsy today as she is happy with improvement of pruritus from vaseline. Discussed that next step would be to do a biopsy to obtain a diagnosis. Patient understands and will follow up if no improvement, lesions worsen. Recommend continued vaseline and sunscreen if exposed to sun.      Gladys Damme, MD [place]

## 2021-01-02 ENCOUNTER — Other Ambulatory Visit: Payer: Self-pay

## 2021-01-02 ENCOUNTER — Ambulatory Visit (INDEPENDENT_AMBULATORY_CARE_PROVIDER_SITE_OTHER): Payer: BC Managed Care – PPO | Admitting: Family Medicine

## 2021-01-02 DIAGNOSIS — L819 Disorder of pigmentation, unspecified: Secondary | ICD-10-CM

## 2021-01-02 NOTE — Assessment & Plan Note (Signed)
60 yo woman with 8 months of pruritic hypopigmentation of b/l lower legs. Pruritus has improved with vaseline, does not have full depigmentatio making vitiligo less likely. DDx is very broad, less likely to be discoid SLE or sarcoidosis. Patient does not want a biopsy today as she is happy with improvement of pruritus from vaseline. Discussed that next step would be to do a biopsy to obtain a diagnosis. Patient understands and will follow up if no improvement, lesions worsen. Recommend continued vaseline and sunscreen if exposed to sun.

## 2021-01-02 NOTE — Patient Instructions (Signed)
It was a pleasure to see you today!  For your leg discoloration and itching, continue using vaseline. If this gets worse or you are more concerned about it, please make a follow up appointment to have a biopsy. If you are going to get a biopsy, do not use vaseline for 1 week prior to the appointment.   I recommend you wear sunscreen if you will be in the sun. UVA-UVB sunscreen 30 spf or higher, reapply every 2 hours.   Be Well,  Dr. Chauncey Reading  Nashimishijwe no Corinna Gab [facility]!  Kubirenge byawe kuguru no kwishongora, komeza ukoreshe vaseline. Niba ibi bibaye bibi cyangwa [place] ubyitayeho cyane, nyamuneka [place] yo gukurikirana biopsy. Niba ugiye kubona biopsy, ntukoreshe vaseline icyumweru 1 mbere yo [place].  Ndagusaba kwambara izuba niba uzaba uri ku zuba. UVA-UVB izuba [place] 30 spf cyangwa [place], ongera usabe buri masaha 2.   Ba neza,  Dr. Chauncey Reading

## 2021-03-17 ENCOUNTER — Encounter (HOSPITAL_COMMUNITY): Payer: Self-pay

## 2021-03-17 ENCOUNTER — Other Ambulatory Visit: Payer: Self-pay

## 2021-03-17 ENCOUNTER — Ambulatory Visit (HOSPITAL_COMMUNITY)
Admission: EM | Admit: 2021-03-17 | Discharge: 2021-03-17 | Disposition: A | Discharge status: Home / Self Care | Payer: BC Managed Care – PPO | Attending: Family Medicine | Admitting: Family Medicine

## 2021-03-17 DIAGNOSIS — B349 Viral infection, unspecified: Secondary | ICD-10-CM | POA: Diagnosis not present

## 2021-03-17 DIAGNOSIS — Z20822 Contact with and (suspected) exposure to covid-19: Secondary | ICD-10-CM | POA: Diagnosis not present

## 2021-03-17 LAB — SARS CORONAVIRUS 2 (TAT 6-24 HRS): SARS Coronavirus 2: NEGATIVE

## 2021-03-17 NOTE — ED Provider Notes (Signed)
[facility]    CSN: [id number] Arrival date & time: 03/17/21  [id number]      History   Chief Complaint Chief Complaint  Patient presents with   Abdominal Pain   Headache    HPI Deborah Sampson is a 60 y.o. female.   Patient here for evaluation of headache, abdominal pain, body aches, and numbness that have been ongoing for the past week.  Denies any nausea, vomiting, or diarrhea.  Reports similar symptoms in the past but that they improved on their own.  Denies any constipation.  Denies any recent sick contacts.  Has not taken any OTC medications or treatments.  Denies any trauma, injury, or other precipitating event.  Denies any specific alleviating or aggravating factors.  Denies any fevers, chest pain, shortness of breath, numbness, tingling, weakness.    The history is provided by the patient. The history is limited by a language barrier. A language interpreter was used.  Abdominal Pain Associated symptoms: no chest pain, no cough and no fever   Headache Associated symptoms: abdominal pain and myalgias   Associated symptoms: no cough and no fever    Past Medical History:  Diagnosis Date   Acute appendicitis 11/21/2018   Back pain 11/27/2015   GERD (gastroesophageal reflux disease)    H. pylori infection 07/25/2019   Headache 12/15/2016   Palpitations 11/27/2015   Patient speaks only [place] 07/25/2019   [facility]e health examination 11/27/2015   S/P laparoscopic appendectomy 11/21/2018   Scars, cultural scarification 11/27/2015    Patient Active Problem List   Diagnosis Date Noted   Single episode of elevated blood pressure 10/02/2020   PUD (peptic ulcer disease) 08/10/2019   Postmenopausal bleeding 07/25/2019   Patient speaks only [place] 07/25/2019     07/25/2019   Fibroids 07/25/2019   Gastroesophageal reflux disease 03/08/2019   Hypopigmentation 03/15/2017   Chronic constipation 01/24/2016    Past Surgical History:  Procedure Laterality Date    LAPAROSCOPIC APPENDECTOMY N/A 11/21/2018   Procedure: APPENDECTOMY LAPAROSCOPIC;  Surgeon: Rolm Bookbinder, MD;  Location: [facility];  Service: General;  Laterality: N/A;    OB History   No obstetric history on file.      Home Medications    Prior to Admission medications   Medication Sig Start Date End Date Taking? Authorizing Provider  dexlansoprazole (DEXILANT) 60 MG capsule Take 60 mg by mouth daily.    [provider]  polyethylene glycol powder (GLYCOLAX/MIRALAX) 17 GM/SCOOP powder Take 17 g by mouth 2 (two) times daily as needed. 10/02/20   Lenoria Chime, MD  white petrolatum (VASELINE) OINT Apply 1 application topically daily. To bilateral legs each evening before bed 11/22/20   Lenoria Chime, MD    Family History Family History  Family history unknown: Yes    Social History Social History   Tobacco Use   Smoking status: Former    Types: Cigarettes    Quit date: 04/05/2014    Years since quitting: 6.9   Smokeless tobacco: Never  Vaping Use   Vaping Use: Never used  Substance Use Topics   Alcohol use: Not Currently   Drug use: Never     Allergies   Penicillins   Review of Systems Review of Systems  Constitutional:  Negative for diaphoresis and fever.  Respiratory:  Negative for cough, choking and chest tightness.   Cardiovascular:  Negative for chest pain.  Gastrointestinal:  Positive for abdominal pain.  Musculoskeletal:  Positive for myalgias.  Neurological:  Positive for headaches.  All other systems reviewed and are negative.   Physical Exam Triage Vital Signs ED Triage Vitals  Enc Vitals Group     BP 03/17/21 0857 (!) 147/90     Pulse Rate 03/17/21 0857 66     Resp 03/17/21 0857 18     Temp 03/17/21 0857 98.5 F (36.9 C)     Temp Source 03/17/21 0857 Oral     SpO2 03/17/21 0857 98 %     Weight --      Height --      Head Circumference --      Peak Flow --      Pain Score 03/17/21 0909 8     Pain Loc --      Pain Edu? --       Excl. in [place]? --    No data found.  Updated Vital Signs BP (!) 147/90 (BP Location: Right Arm)   Pulse 66   Temp 98.5 F (36.9 C) (Oral)   Resp 18   LMP 09/07/2018   SpO2 98%   Visual Acuity Right Eye Distance:   Left Eye Distance:   Bilateral Distance:    Right Eye Near:   Left Eye Near:    Bilateral Near:     Physical Exam Vitals and nursing note reviewed.  Constitutional:      General: She is not in acute distress.    Appearance: Normal appearance. She is not ill-appearing, toxic-appearing or diaphoretic.  HENT:     Head: Normocephalic and atraumatic.     Right Ear: Tympanic membrane, ear canal and external ear normal.     Left Ear: Tympanic membrane, ear canal and external ear normal.     Nose:     Right Turbinates: Swollen.     Left Turbinates: Swollen.     Right Sinus: No maxillary sinus tenderness or frontal sinus tenderness.     Left Sinus: No maxillary sinus tenderness or frontal sinus tenderness.     Mouth/Throat:     Pharynx: Uvula midline. Posterior oropharyngeal erythema present. No pharyngeal swelling or uvula swelling.     Tonsils: No tonsillar exudate or tonsillar abscesses. 0 on the right. 0 on the left.  Eyes:     Conjunctiva/sclera: Conjunctivae normal.  Cardiovascular:     Rate and Rhythm: Normal rate and regular rhythm.     Pulses: Normal pulses.     Heart sounds: Normal heart sounds.  Pulmonary:     Effort: Pulmonary effort is normal.     Breath sounds: Normal breath sounds.  Abdominal:     General: Abdomen is flat. Bowel sounds are normal.     Palpations: Abdomen is soft.     Tenderness: There is no abdominal tenderness.  Musculoskeletal:        General: Normal range of motion.     Cervical back: Normal range of motion.  Skin:    General: Skin is warm and dry.  Neurological:     General: No focal deficit present.     Mental Status: She is alert and oriented to person, place, and time.     GCS: GCS eye subscore is 4. GCS verbal  subscore is 5. GCS motor subscore is 6.     Cranial Nerves: No cranial nerve deficit, dysarthria or facial asymmetry.     Sensory: No sensory deficit.     Motor: No weakness.     Gait: Gait normal.  Psychiatric:  Mood and Affect: Mood normal.     UC Treatments / Results  Labs (all labs ordered are listed, but only abnormal results are displayed) Labs Reviewed  SARS CORONAVIRUS 2 (TAT 6-24 HRS)    EKG   Radiology No results found.  Procedures Procedures (including critical care time)  Medications Ordered in UC Medications - No data to display  Initial Impression / Assessment and Plan / UC Course  I have reviewed the triage vital signs and the nursing notes.  Pertinent labs & imaging results that were available during my care of the patient were reviewed by me and considered in my medical decision making (see chart for details).    Assessment negative for red flags or concerns.  Likely viral URI.  COVID test pending.  Discussed conservative symptom management as described in discharge instructions.  Recommend Tylenol and/or ibuprofen.  Recommend fluids and rest.  Follow-up as needed Final Clinical Impressions(s) / UC Diagnoses   Final diagnoses:  Viral illness     Discharge Instructions      We will contact you if your COVID test is positive.   You can take Tylenol and/or Ibuprofen as needed for fever reduction and pain relief.   For cough: honey 1/2 to 1 teaspoon (you can dilute the honey in water or another fluid).  You can also use guaifenesin and dextromethorphan for cough. You can use a humidifier for chest congestion and cough.  If you don't have a humidifier, you can sit in the bathroom with the hot shower running.     For sore throat: try warm salt water gargles, cepacol lozenges, throat spray, warm tea or water with lemon/honey, popsicles or ice, or OTC cold relief medicine for throat discomfort.    For congestion: take a daily anti-histamine like  Zyrtec, Claritin, and a oral decongestant, such as pseudoephedrine.  You can also use Flonase 1-2 sprays in each nostril daily.    It is important to stay hydrated: drink plenty of fluids (water, gatorade/powerade/pedialyte, juices, or teas) to keep your throat moisturized and help further relieve irritation/discomfort.   Return or go to the Emergency Department if symptoms worsen or do not improve in the next few days.    ED Prescriptions   None    PDMP not reviewed this encounter.   Pearson Forster, NP 03/17/21 [phone]

## 2021-03-17 NOTE — Discharge Instructions (Addendum)
We will contact you if your COVID test is positive.   You can take Tylenol and/or Ibuprofen as needed for fever reduction and pain relief.   For cough: honey 1/2 to 1 teaspoon (you can dilute the honey in water or another fluid).  You can also use guaifenesin and dextromethorphan for cough. You can use a humidifier for chest congestion and cough.  If you don't have a humidifier, you can sit in the bathroom with the hot shower running.     For sore throat: try warm salt water gargles, cepacol lozenges, throat spray, warm tea or water with lemon/honey, popsicles or ice, or OTC cold relief medicine for throat discomfort.    For congestion: take a daily anti-histamine like Zyrtec, Claritin, and a oral decongestant, such as pseudoephedrine.  You can also use Flonase 1-2 sprays in each nostril daily.    It is important to stay hydrated: drink plenty of fluids (water, gatorade/powerade/pedialyte, juices, or teas) to keep your throat moisturized and help further relieve irritation/discomfort.   Return or go to the Emergency Department if symptoms worsen or do not improve in the next few days.

## 2021-03-17 NOTE — ED Triage Notes (Signed)
Used interpretor  Pt having headache, abd pain, and numbness all over the body that is getting worse day by day. Symptoms have been going on for a week. Denies n/v/d or urinary problems.  Pt reports had pain before and got better but now abd pain coming back.  LBM yesterday and was normal

## 2021-04-16 ENCOUNTER — Ambulatory Visit (HOSPITAL_COMMUNITY)
Admission: EM | Admit: 2021-04-16 | Discharge: 2021-04-16 | Disposition: A | Discharge status: Home / Self Care | Payer: BC Managed Care – PPO | Attending: Internal Medicine | Admitting: Internal Medicine

## 2021-04-16 ENCOUNTER — Encounter (HOSPITAL_COMMUNITY): Payer: Self-pay

## 2021-04-16 ENCOUNTER — Other Ambulatory Visit: Payer: Self-pay

## 2021-04-16 DIAGNOSIS — R1084 Generalized abdominal pain: Secondary | ICD-10-CM | POA: Diagnosis present

## 2021-04-16 LAB — POCT URINALYSIS DIPSTICK, ED / UC
Bilirubin Urine: NEGATIVE
Glucose, UA: NEGATIVE mg/dL
Hgb urine dipstick: NEGATIVE
Ketones, ur: NEGATIVE mg/dL
Leukocytes,Ua: NEGATIVE
Nitrite: NEGATIVE
Protein, ur: NEGATIVE mg/dL
Specific Gravity, Urine: <=1.005 (ref 1.005–1.030)
Urobilinogen, UA: 0.2 mg/dL (ref 0.0–1.0)
pH: 5.5 (ref 5.0–8.0)

## 2021-04-16 LAB — CBC WITH DIFFERENTIAL/PLATELET
Abs Immature Granulocytes: 0.01 10*3/uL (ref 0.00–0.07)
Basophils Absolute: 0 10*3/uL (ref 0.0–0.1)
Basophils Relative: 0 %
Eosinophils Absolute: 0 10*3/uL (ref 0.0–0.5)
Eosinophils Relative: 1 %
HCT: 41.1 % (ref 36.0–46.0)
Hemoglobin: 13.7 g/dL (ref 12.0–15.0)
Immature Granulocytes: 0 %
Lymphocytes Relative: 42 %
Lymphs Abs: 2 10*3/uL (ref 0.7–4.0)
MCH: 27.8 pg (ref 26.0–34.0)
MCHC: 33.3 g/dL (ref 30.0–36.0)
MCV: 83.5 fL (ref 80.0–100.0)
Monocytes Absolute: 0.4 10*3/uL (ref 0.1–1.0)
Monocytes Relative: 9 %
Neutro Abs: 2.2 10*3/uL (ref 1.7–7.7)
Neutrophils Relative %: 48 %
Platelets: 258 10*3/uL (ref 150–400)
RBC: 4.92 MIL/uL (ref 3.87–5.11)
RDW: 11.9 % (ref 11.5–15.5)
WBC: 4.6 10*3/uL (ref 4.0–10.5)
nRBC: 0 % (ref 0.0–0.2)

## 2021-04-16 LAB — COMPREHENSIVE METABOLIC PANEL
ALT: 17 U/L (ref 0–44)
AST: 18 U/L (ref 15–41)
Albumin: 3.5 g/dL (ref 3.5–5.0)
Alkaline Phosphatase: 71 U/L (ref 38–126)
Anion gap: 6 (ref 5–15)
BUN: 8 mg/dL (ref 6–20)
CO2: 27 mmol/L (ref 22–32)
Calcium: 9.4 mg/dL (ref 8.9–10.3)
Chloride: 104 mmol/L (ref 98–111)
Creatinine, Ser: 0.68 mg/dL (ref 0.44–1.00)
GFR, Estimated: >60 mL/min (ref >60)
Glucose, Bld: 99 mg/dL (ref 70–99)
Potassium: 3.7 mmol/L (ref 3.5–5.1)
Sodium: 137 mmol/L (ref 135–145)
Total Bilirubin: 0.2 mg/dL — ABNORMAL LOW (ref 0.3–1.2)
Total Protein: 7.3 g/dL (ref 6.5–8.1)

## 2021-04-16 LAB — LIPASE, BLOOD: Lipase: 30 U/L (ref 11–51)

## 2021-04-16 NOTE — Discharge Instructions (Addendum)
Start taking Dexilant again to help with your pain.  We have ordered urine testing and blood tests to see if anything is abnormal and the results should be back tomorrow.  You need to follow up with your primary care doctor to discuss your sweating and your abdominal pain in more detail.  If you pain significantly worsens, you should go to the emergency room right away.

## 2021-04-16 NOTE — ED Triage Notes (Signed)
Per interpretor, pt c/o generalized abdominal pain x2 days. States having fever and sweats off and on for over a week. States told to take tylenol but afraid to. Last tylenol a month ago.

## 2021-04-16 NOTE — ED Provider Notes (Addendum)
[facility]    CSN: [id number] Arrival date & time: 04/16/21  0825      History   Chief Complaint Chief Complaint  Patient presents with   Abdominal Pain    HPI Deborah Sampson is a 60 y.o. female.   Generalized Abdominal Pain Has been occurring for three weeks, but has had similar pain over the last year Was seen by GI and was on Dexilant, but states that she stopped taking it in the last few months because she was told that she could not take it long-term because it was dangerous She states that she also gets very hot and sweaty throughout the day and this has been going on for many months as well On chart review it looks like this was discussed at her GI appointment in March and she had a normal CBC at that time She has a primary care provider, but has not been seen for this recently She denies any vomiting, diarrhea She has occasional constipation but this improves with taking medication She reports being able to eat and drink, however when the pain is occurring she does not want to eat She also feels that she gets more saliva in her mouth when she is having the pain She denies chest pain or difficulty breathing  [place] interpreter used for entirety of encounter  Past Medical History:  Diagnosis Date   Acute appendicitis 11/21/2018   Back pain 11/27/2015   GERD (gastroesophageal reflux disease)    H. pylori infection 07/25/2019   Headache 12/15/2016   Palpitations 11/27/2015   Patient speaks only [place] 07/25/2019   Refugee health examination 11/27/2015   S/P laparoscopic appendectomy 11/21/2018   Scars, cultural scarification 11/27/2015    Patient Active Problem List   Diagnosis Date Noted   Single episode of elevated blood pressure 10/02/2020   PUD (peptic ulcer disease) 08/10/2019   Postmenopausal bleeding 07/25/2019   Patient speaks only [place] 07/25/2019     07/25/2019   Fibroids 07/25/2019   Gastroesophageal reflux disease 03/08/2019    Hypopigmentation 03/15/2017   Chronic constipation 01/24/2016    Past Surgical History:  Procedure Laterality Date   LAPAROSCOPIC APPENDECTOMY N/A 11/21/2018   Procedure: APPENDECTOMY LAPAROSCOPIC;  Surgeon: Rolm Bookbinder, MD;  Location: [facility];  Service: General;  Laterality: N/A;    OB History   No obstetric history on file.      Home Medications    Prior to Admission medications   Medication Sig Start Date End Date Taking? Authorizing Provider  dexlansoprazole (DEXILANT) 60 MG capsule Take 60 mg by mouth daily.    [provider]  polyethylene glycol powder (GLYCOLAX/MIRALAX) 17 GM/SCOOP powder Take 17 g by mouth 2 (two) times daily as needed. 10/02/20   Lenoria Chime, MD  white petrolatum (VASELINE) OINT Apply 1 application topically daily. To bilateral legs each evening before bed 11/22/20   Lenoria Chime, MD    Family History Family History  Family history unknown: Yes    Social History Social History   Tobacco Use   Smoking status: Former    Types: Cigarettes    Quit date: 04/05/2014    Years since quitting: 7.0   Smokeless tobacco: Never  Vaping Use   Vaping Use: Never used  Substance Use Topics   Alcohol use: Not Currently   Drug use: Never     Allergies   Penicillins   Review of Systems Review of Systems  All other systems reviewed and are negative.  Per HPI  Physical Exam Triage Vital Signs ED Triage Vitals  Enc Vitals Group     BP 04/16/21 0913 136/76     Pulse Rate 04/16/21 0913 82     Resp 04/16/21 0913 18     Temp 04/16/21 0913 98.2 F (36.8 C)     Temp Source 04/16/21 0913 Oral     SpO2 04/16/21 0913 98 %     Weight --      Height --      Head Circumference --      Peak Flow --      Pain Score 04/16/21 0919 8     Pain Loc --      Pain Edu? --      Excl. in [place]? --    No data found.  Updated Vital Signs BP 136/76 (BP Location: Left Arm)   Pulse 82   Temp 98.2 F (36.8 C) (Oral)   Resp 18   LMP 09/07/2018    SpO2 98%   Visual Acuity Right Eye Distance:   Left Eye Distance:   Bilateral Distance:    Right Eye Near:   Left Eye Near:    Bilateral Near:     Physical Exam Constitutional:      General: She is not in acute distress.    Appearance: She is well-developed. She is not ill-appearing or toxic-appearing.  HENT:     Head: Normocephalic and atraumatic.  Cardiovascular:     Rate and Rhythm: Normal rate.  Pulmonary:     Effort: Pulmonary effort is normal. No respiratory distress.     Breath sounds: Normal breath sounds.  Abdominal:     General: Abdomen is flat. There is no distension.     Palpations: Abdomen is soft. There is no mass.     Tenderness: There is abdominal tenderness in the right lower quadrant, epigastric area, suprapubic area and left lower quadrant. There is no right CVA tenderness, left CVA tenderness, guarding or rebound.  Skin:    General: Skin is warm and dry.  Neurological:     General: No focal deficit present.     Mental Status: She is alert and oriented to person, place, and time.     UC Treatments / Results  Labs (all labs ordered are listed, but only abnormal results are displayed) Labs Reviewed  URINE CULTURE  URINALYSIS, DIPSTICK ONLY  CBC WITH DIFFERENTIAL/PLATELET  COMPREHENSIVE METABOLIC PANEL  LIPASE, BLOOD  POCT URINALYSIS DIPSTICK, ED / UC    EKG   Radiology No results found.  Procedures Procedures (including critical care time)  Medications Ordered in UC Medications - No data to display  Initial Impression / Assessment and Plan / UC Course  I have reviewed the triage vital signs and the nursing notes.  Pertinent labs & imaging results that were available during my care of the patient were reviewed by me and considered in my medical decision making (see chart for details).     Patient is a 60 year old female with past history of peptic ulcer disease, chronic constipation, who presents with chronic abdominal pain and  reported sweating/hot flashes.  She has been seen previously by her primary care provider as well as GI and had previously been on Dexilant, but states that she is no longer taking this.  Given the chronicity of her symptoms, doubt infectious cause.  She is also had normal labs in the past.  We will obtain UA and urine culture today given her suprapubic tenderness, will  also obtain CBC, CMP, lipase.  UA negative, will f/u culture.  Advise follow-up with her primary care provider and to reinitiate Dexilant, she confirms that she has some at home and does not need a refill.  She was given ED precautions for worsening abdominal pain.  She was discharged home in stable condition. Final Clinical Impressions(s) / UC Diagnoses   Final diagnoses:  Generalized abdominal pain     Discharge Instructions      Start taking Dexilant again to help with your pain.  We have ordered urine testing and blood tests to see if anything is abnormal and the results should be back tomorrow.  You need to follow up with your primary care doctor to discuss your sweating and your abdominal pain in more detail.  If you pain significantly worsens, you should go to the emergency room right away.     ED Prescriptions   None    PDMP not reviewed this encounter.   Cleophas Dunker, DO 04/16/21 1007    Clance Baquero, Bernita Raisin, DO 04/16/21 1026

## 2021-04-17 LAB — URINE CULTURE: Culture: NO GROWTH

## 2021-04-28 ENCOUNTER — Other Ambulatory Visit: Payer: Self-pay

## 2021-04-28 ENCOUNTER — Encounter (HOSPITAL_COMMUNITY): Payer: Self-pay | Admitting: *Deleted

## 2021-04-28 ENCOUNTER — Ambulatory Visit (HOSPITAL_COMMUNITY)
Admission: EM | Admit: 2021-04-28 | Discharge: 2021-04-28 | Disposition: A | Discharge status: Home / Self Care | Payer: BC Managed Care – PPO | Attending: Internal Medicine | Admitting: Internal Medicine

## 2021-04-28 DIAGNOSIS — R61 Generalized hyperhidrosis: Secondary | ICD-10-CM

## 2021-04-28 NOTE — ED Provider Notes (Signed)
[facility]    CSN: [id number] Arrival date & time: 04/28/21  0847      History   Chief Complaint Chief Complaint  Patient presents with   lab follow up     HPI Deborah Sampson is a 60 y.o. female comes to the urgent care for follow-up to discuss labs.  Patient was evaluated for night sweats.  She has no pulmonary symptoms.  Her last menstrual period was about a year ago.  She has had worsening night sweats over the past year.  No weight loss.  No change in mood.  No generalized body aches.   HPI  Past Medical History:  Diagnosis Date   Acute appendicitis 11/21/2018   Back pain 11/27/2015   GERD (gastroesophageal reflux disease)    H. pylori infection 07/25/2019   Headache 12/15/2016   Palpitations 11/27/2015   Patient speaks only [place] 07/25/2019   Refugee health examination 11/27/2015   S/P laparoscopic appendectomy 11/21/2018   Scars, cultural scarification 11/27/2015    Patient Active Problem List   Diagnosis Date Noted   Single episode of elevated blood pressure 10/02/2020   PUD (peptic ulcer disease) 08/10/2019   Postmenopausal bleeding 07/25/2019   Patient speaks only [place] 07/25/2019     07/25/2019   Fibroids 07/25/2019   Gastroesophageal reflux disease 03/08/2019   Hypopigmentation 03/15/2017   Chronic constipation 01/24/2016    Past Surgical History:  Procedure Laterality Date   LAPAROSCOPIC APPENDECTOMY N/A 11/21/2018   Procedure: APPENDECTOMY LAPAROSCOPIC;  Surgeon: Rolm Bookbinder, MD;  Location: [facility];  Service: General;  Laterality: N/A;    OB History   No obstetric history on file.      Home Medications    Prior to Admission medications   Medication Sig Start Date End Date Taking? Authorizing Provider  dexlansoprazole (DEXILANT) 60 MG capsule Take 60 mg by mouth daily.    [provider]  polyethylene glycol powder (GLYCOLAX/MIRALAX) 17 GM/SCOOP powder Take 17 g by mouth 2 (two) times daily as needed. 10/02/20    Lenoria Chime, MD  white petrolatum (VASELINE) OINT Apply 1 application topically daily. To bilateral legs each evening before bed 11/22/20   Lenoria Chime, MD    Family History Family History  Family history unknown: Yes    Social History Social History   Tobacco Use   Smoking status: Former    Types: Cigarettes    Quit date: 04/05/2014    Years since quitting: 7.0   Smokeless tobacco: Never  Vaping Use   Vaping Use: Never used  Substance Use Topics   Alcohol use: Not Currently   Drug use: Never     Allergies   Penicillins   Review of Systems Review of Systems As per HPI  Physical Exam Triage Vital Signs ED Triage Vitals  Enc Vitals Group     BP 04/28/21 0950 130/85     Pulse Rate 04/28/21 0950 66     Resp 04/28/21 0950 20     Temp 04/28/21 0950 98 F (36.7 C)     Temp src --      SpO2 04/28/21 0950 97 %     Weight --      Height --      Head Circumference --      Peak Flow --      Pain Score 04/28/21 0951 0     Pain Loc --      Pain Edu? --      Excl.  in [place]? --    No data found.  Updated Vital Signs BP 130/85   Pulse 66   Temp 98 F (36.7 C)   Resp 20   LMP 09/07/2018   SpO2 97%   Visual Acuity Right Eye Distance:   Left Eye Distance:   Bilateral Distance:    Right Eye Near:   Left Eye Near:    Bilateral Near:     Physical Exam Vitals and nursing note reviewed.  Constitutional:      General: She is not in acute distress.    Appearance: She is not ill-appearing.  Cardiovascular:     Rate and Rhythm: Normal rate and regular rhythm.     Pulses: Normal pulses.     Heart sounds: Normal heart sounds.  Pulmonary:     Effort: Pulmonary effort is normal.     Breath sounds: Normal breath sounds.  Abdominal:     General: Abdomen is flat. Bowel sounds are normal.  Musculoskeletal:        General: Normal range of motion.  Skin:    Capillary Refill: Capillary refill takes less than 2 seconds.  Neurological:     General: No focal  deficit present.     Mental Status: She is alert and oriented to person, place, and time.     UC Treatments / Results  Labs (all labs ordered are listed, but only abnormal results are displayed) Labs Reviewed - No data to display  EKG   Radiology No results found.  Procedures Procedures (including critical care time)  Medications Ordered in UC Medications - No data to display  Initial Impression / Assessment and Plan / UC Course  I have reviewed the triage vital signs and the nursing notes.  Pertinent labs & imaging results that were available during my care of the patient were reviewed by me and considered in my medical decision making (see chart for details).     1.  Night sweats: Labs were reviewed and discussed with patient Patient is advised to follow-up with primary care physician If patient has further symptoms she is advised to return to urgent care to be reevaluated. Final Clinical Impressions(s) / UC Diagnoses   Final diagnoses:  Unexplained night sweats     Discharge Instructions      Please follow-up with your primary care physician for further evaluation No medications has been as prescribed at this time      ED Prescriptions   None    PDMP not reviewed this encounter.   Chase Picket, MD 04/28/21 [phone]

## 2021-04-28 NOTE — Discharge Instructions (Addendum)
Please follow-up with your primary care physician for further evaluation No medications has been as prescribed at this time

## 2021-04-28 NOTE — ED Triage Notes (Signed)
Pt here for results of Labs

## 2021-05-03 ENCOUNTER — Emergency Department (HOSPITAL_COMMUNITY)
Admission: EM | Admit: 2021-05-03 | Discharge: 2021-05-03 | Disposition: A | Discharge status: Home / Self Care | Payer: BC Managed Care – PPO | Attending: Emergency Medicine | Admitting: Emergency Medicine

## 2021-05-03 ENCOUNTER — Encounter (HOSPITAL_COMMUNITY): Payer: Self-pay

## 2021-05-03 ENCOUNTER — Other Ambulatory Visit: Payer: Self-pay

## 2021-05-03 DIAGNOSIS — K029 Dental caries, unspecified: Secondary | ICD-10-CM | POA: Insufficient documentation

## 2021-05-03 DIAGNOSIS — R519 Headache, unspecified: Secondary | ICD-10-CM | POA: Insufficient documentation

## 2021-05-03 DIAGNOSIS — H6591 Unspecified nonsuppurative otitis media, right ear: Secondary | ICD-10-CM | POA: Insufficient documentation

## 2021-05-03 DIAGNOSIS — R1011 Right upper quadrant pain: Secondary | ICD-10-CM | POA: Insufficient documentation

## 2021-05-03 DIAGNOSIS — Z87891 Personal history of nicotine dependence: Secondary | ICD-10-CM | POA: Insufficient documentation

## 2021-05-03 DIAGNOSIS — R1013 Epigastric pain: Secondary | ICD-10-CM | POA: Insufficient documentation

## 2021-05-03 DIAGNOSIS — G8929 Other chronic pain: Secondary | ICD-10-CM | POA: Insufficient documentation

## 2021-05-03 LAB — COMPREHENSIVE METABOLIC PANEL
ALT: 24 U/L (ref 0–44)
AST: 26 U/L (ref 15–41)
Albumin: 3.5 g/dL (ref 3.5–5.0)
Alkaline Phosphatase: 72 U/L (ref 38–126)
Anion gap: 10 (ref 5–15)
BUN: 8 mg/dL (ref 6–20)
CO2: 25 mmol/L (ref 22–32)
Calcium: 9.3 mg/dL (ref 8.9–10.3)
Chloride: 103 mmol/L (ref 98–111)
Creatinine, Ser: 0.66 mg/dL (ref 0.44–1.00)
GFR, Estimated: >60 mL/min (ref >60)
Glucose, Bld: 128 mg/dL — ABNORMAL HIGH (ref 70–99)
Potassium: 3.6 mmol/L (ref 3.5–5.1)
Sodium: 138 mmol/L (ref 135–145)
Total Bilirubin: 0.6 mg/dL (ref 0.3–1.2)
Total Protein: 7.2 g/dL (ref 6.5–8.1)

## 2021-05-03 LAB — CBC WITH DIFFERENTIAL/PLATELET
Abs Immature Granulocytes: 0.01 10*3/uL (ref 0.00–0.07)
Basophils Absolute: 0 10*3/uL (ref 0.0–0.1)
Basophils Relative: 1 %
Eosinophils Absolute: 0 10*3/uL (ref 0.0–0.5)
Eosinophils Relative: 1 %
HCT: 41 % (ref 36.0–46.0)
Hemoglobin: 13.7 g/dL (ref 12.0–15.0)
Immature Granulocytes: 0 %
Lymphocytes Relative: 44 %
Lymphs Abs: 1.7 10*3/uL (ref 0.7–4.0)
MCH: 27.8 pg (ref 26.0–34.0)
MCHC: 33.4 g/dL (ref 30.0–36.0)
MCV: 83.3 fL (ref 80.0–100.0)
Monocytes Absolute: 0.4 10*3/uL (ref 0.1–1.0)
Monocytes Relative: 10 %
Neutro Abs: 1.8 10*3/uL (ref 1.7–7.7)
Neutrophils Relative %: 44 %
Platelets: 245 10*3/uL (ref 150–400)
RBC: 4.92 MIL/uL (ref 3.87–5.11)
RDW: 12 % (ref 11.5–15.5)
WBC: 4 10*3/uL (ref 4.0–10.5)
nRBC: 0 % (ref 0.0–0.2)

## 2021-05-03 LAB — URINALYSIS, ROUTINE W REFLEX MICROSCOPIC
Bilirubin Urine: NEGATIVE
Glucose, UA: NEGATIVE mg/dL
Hgb urine dipstick: NEGATIVE
Ketones, ur: NEGATIVE mg/dL
Leukocytes,Ua: NEGATIVE
Nitrite: NEGATIVE
Protein, ur: NEGATIVE mg/dL
Specific Gravity, Urine: 1.006 (ref 1.005–1.030)
pH: 7 (ref 5.0–8.0)

## 2021-05-03 LAB — LIPASE, BLOOD: Lipase: 40 U/L (ref 11–51)

## 2021-05-03 MED ORDER — CEFDINIR 300 MG PO CAPS: 300.0000 mg | ORAL_CAPSULE | Freq: Two times a day (BID) | ORAL | 0 refills | Status: DC

## 2021-05-03 MED ORDER — ACETAMINOPHEN 500 MG PO TABS: 1000.0000 mg | ORAL_TABLET | Freq: Four times a day (QID) | ORAL | 0 refills | Status: DC | PRN

## 2021-05-03 NOTE — ED Triage Notes (Signed)
Per interpretor patient has had abdominal pain for 3 months. Reports that the pain is constant and points to bilateral epigastric area. Denies any associated symptoms

## 2021-05-03 NOTE — ED Provider Notes (Signed)
[facility] EMERGENCY DEPARTMENT Provider Note   CSN: [id number] Arrival date & time: 05/03/21  1231     History No chief complaint on file.   Deborah Sampson is a 60 y.o. female.  HPI Interview conducted with family member\friend at bedside who translated.  Patient reports generalized pain and aching but more of generalized headache for about 3 days.  No fevers.  No sore throat.  No cough.  No vomiting.  Patient reportedly has chronic abdominal pain.  Patient indicates she persist to have problem to the in the epigastrium and to the right side of the upper abdomen.  She was having problems with constipation but since starting a new medication, Dexilant, she is having more regular bowel movements.  No rashes.  Notably on exam patient does have fair amount of dental decay.  She reports she has had 1 tooth pulled but has not been back to the dentist.  Patient is reportedly up-to-date for COVID-vaccine.      Past Medical History:  Diagnosis Date   Acute appendicitis 11/21/2018   Back pain 11/27/2015   GERD (gastroesophageal reflux disease)    H. pylori infection 07/25/2019   Headache 12/15/2016   Palpitations 11/27/2015   Patient speaks only [place] 07/25/2019   Refugee health examination 11/27/2015   S/P laparoscopic appendectomy 11/21/2018   Scars, cultural scarification 11/27/2015    Patient Active Problem List   Diagnosis Date Noted   Single episode of elevated blood pressure 10/02/2020   PUD (peptic ulcer disease) 08/10/2019   Postmenopausal bleeding 07/25/2019   Patient speaks only [place] 07/25/2019     07/25/2019   Fibroids 07/25/2019   Gastroesophageal reflux disease 03/08/2019   Hypopigmentation 03/15/2017   Chronic constipation 01/24/2016    Past Surgical History:  Procedure Laterality Date   LAPAROSCOPIC APPENDECTOMY N/A 11/21/2018   Procedure: APPENDECTOMY LAPAROSCOPIC;  Surgeon: Rolm Bookbinder, MD;  Location: [facility];  Service: General;   Laterality: N/A;     OB History   No obstetric history on file.     Family History  Family history unknown: Yes    Social History   Tobacco Use   Smoking status: Former    Types: Cigarettes    Quit date: 04/05/2014    Years since quitting: 7.0   Smokeless tobacco: Never  Vaping Use   Vaping Use: Never used  Substance Use Topics   Alcohol use: Not Currently   Drug use: Never    Home Medications Prior to Admission medications   Medication Sig Start Date End Date Taking? Authorizing Provider  acetaminophen (TYLENOL) 500 MG tablet Take 2 tablets (1,000 mg total) by mouth every 6 (six) hours as needed. 05/03/21  Yes Charlesetta Shanks, MD  cefdinir (OMNICEF) 300 MG capsule Take 1 capsule (300 mg total) by mouth 2 (two) times daily. 05/03/21  Yes Charlesetta Shanks, MD  dexlansoprazole (DEXILANT) 60 MG capsule Take 60 mg by mouth daily.    [provider]  polyethylene glycol powder (GLYCOLAX/MIRALAX) 17 GM/SCOOP powder Take 17 g by mouth 2 (two) times daily as needed. 10/02/20   Lenoria Chime, MD  white petrolatum (VASELINE) OINT Apply 1 application topically daily. To bilateral legs each evening before bed 11/22/20   Lenoria Chime, MD    Allergies    Penicillins  Review of Systems   Review of Systems 10 Systems reviewed and negative except as per HPI Physical Exam Updated Vital Signs BP 125/63   Pulse 66   Temp  98.3 F (36.8 C)   Resp 18   LMP 09/07/2018   SpO2 100%   Physical Exam Constitutional:      Comments: Alert nontoxic clinically well in appearance.  No respiratory distress.  Normal vital signs  HENT:     Head: Normocephalic and atraumatic.     Ears:     Comments: Right TM has effusion with slight bulging.  Mild erythema.  Left TM normal.    Nose: Nose normal.     Mouth/Throat:     Comments: Patient has several areas of dental decay at the gumline most notably right inferior.  However, no abscess around gumline and no facial swelling.  Posterior  oropharynx widely patent.  No erythema no exudate Eyes:     Extraocular Movements: Extraocular movements intact.     Pupils: Pupils are equal, round, and reactive to light.  Neck:     Comments: No Lymphadenopathy no neck stiffness. Cardiovascular:     Rate and Rhythm: Normal rate and regular rhythm.  Pulmonary:     Effort: Pulmonary effort is normal.     Breath sounds: Normal breath sounds.  Abdominal:     Comments: Abdomen soft.  No guarding.  Patient endorses some discomfort to the far right lateral abdomen.  No palpable masses or soft tissue abnormalities.  No rashes.  Musculoskeletal:        General: No swelling or tenderness. Normal range of motion.     Cervical back: Neck supple.     Right lower leg: No edema.     Left lower leg: No edema.  Skin:    General: Skin is warm and dry.  Neurological:     General: No focal deficit present.     Mental Status: She is oriented to person, place, and time.     Motor: No weakness.     Coordination: Coordination normal.  Psychiatric:        Mood and Affect: Mood normal.     Comments: Patient is calmly speaking with her companion while doing interview.  No signs of distress or confusion.    ED Results / Procedures / Treatments   Labs (all labs ordered are listed, but only abnormal results are displayed) Labs Reviewed  COMPREHENSIVE METABOLIC PANEL - Abnormal; Notable for the following components:      Result Value   Glucose, Bld 128 (*)    All other components within normal limits  URINALYSIS, ROUTINE W REFLEX MICROSCOPIC - Abnormal; Notable for the following components:   Color, Urine STRAW (*)    All other components within normal limits  CBC WITH DIFFERENTIAL/PLATELET  LIPASE, BLOOD    EKG None  Radiology No results found.  Procedures Procedures   Medications Ordered in ED Medications - No data to display  ED Course  I have reviewed the triage vital signs and the nursing notes.  Pertinent labs & imaging results  that were available during my care of the patient were reviewed by me and considered in my medical decision making (see chart for details).    MDM Rules/Calculators/A&P                           Triage intake indicates a chief complaint of abdominal pain.  Patient has had recurrent presentations and evaluations for abdominal pain..  At this time however based on my interview with the patient's companion at bedside translating, patient's chief complaint is more generalized pain and aching.  She  is not focusing on abdominal pain as a primary complaint.  She complains of headache which is generalized.  On physical exam patient does have abnormal findings with the right TM being retracted with an effusion behind it.  Also significant dental decay of several teeth but right lower particular.  However, no facial swelling or evidence of abscess or cellulitis.  Based on exam findings combined with history obtained by my interview, will opt to treat for otitis media.  Return precautions reviewed and follow-up plan including discharge instructions Final Clinical Impression(s) / ED Diagnoses Final diagnoses:  Right otitis media with effusion  Dental decay  Generalized headache    Rx / DC Orders ED Discharge Orders          Ordered    cefdinir (OMNICEF) 300 MG capsule  2 times daily        05/03/21 1903    acetaminophen (TYLENOL) 500 MG tablet  Every 6 hours PRN        05/03/21 1903             Charlesetta Shanks, MD 05/05/21 1553

## 2021-05-03 NOTE — ED Notes (Signed)
The pt has had abd pain for 3 months  she does noyt speak english  family at bedside  the pt speaks a lnaguage  I did not know.  The pt has seen a doctor but unknown what shes been diagnosed with

## 2021-05-03 NOTE — ED Provider Notes (Signed)
Emergency Medicine Provider Triage Evaluation Note  Edell Mesenbrink , a 60 y.o. female  was evaluated in triage.  Pt complains of abdominal pain x3 months.  Comes in goes, epigastric and left flank pain.  No urinary symptoms, nausea, vomiting.  Patient has had prior abdominal surgeries but unsure which ones..  Came in today because the pain was unbearable and she has not been sleeping well.  Review of Systems  Positive: Abdominal pain Negative: Dysuria, nausea, vomiting  Physical Exam  BP (!) 144/89 (BP Location: Right Arm)   Pulse 79   Temp 98.7 F (37.1 C) (Oral)   Resp 16   LMP 09/07/2018   SpO2 100%  Gen:   Awake, no distress   Resp:  Normal effort  MSK:   Moves extremities without difficulty  Other:  Epigastric tenderness, left flank tenderness.  Medical Decision Making  Medically screening exam initiated at 12:43 PM.  Appropriate orders placed.  Darsha Zumstein was informed that the remainder of the evaluation will be completed by another provider, this initial triage assessment does not replace that evaluation, and the importance of remaining in the ED until their evaluation is complete.  Translator was used, abdominal labs ordered.     Sherrill Raring, PA-C 05/03/21 1244    Davonna Belling, MD 05/03/21 1651

## 2021-05-03 NOTE — Discharge Instructions (Addendum)
1.  See your family doctor for a recheck in 5 to 7 days 2.  Make an appointment with a dentist as soon as possible. 3.  Take acetaminophen as prescribed every 6 hours for pain if needed.

## 2021-05-14 ENCOUNTER — Ambulatory Visit: Payer: Self-pay | Admitting: Family Medicine

## 2021-05-14 NOTE — Progress Notes (Deleted)
    SUBJECTIVE:   CHIEF COMPLAINT / HPI:   ***  PERTINENT  PMH / PSH: 4 ER visits in the last 2 months  Labs 05/03/21 unremarkable Last abdomen CT in 2020 with acute appendicitis   OBJECTIVE:   LMP 09/07/2018   ***  ASSESSMENT/PLAN:   No problem-specific Assessment & Plan notes found for this encounter.     Lind Covert, MD [place]

## 2021-09-18 ENCOUNTER — Ambulatory Visit (HOSPITAL_COMMUNITY)
Admission: EM | Admit: 2021-09-18 | Discharge: 2021-09-18 | Disposition: A | Discharge status: Home / Self Care | Payer: Self-pay | Attending: Family Medicine | Admitting: Family Medicine

## 2021-09-18 ENCOUNTER — Other Ambulatory Visit: Payer: Self-pay

## 2021-09-18 ENCOUNTER — Encounter (HOSPITAL_COMMUNITY): Payer: Self-pay

## 2021-09-18 DIAGNOSIS — R1013 Epigastric pain: Secondary | ICD-10-CM

## 2021-09-18 MED ORDER — ONDANSETRON 4 MG PO TBDP: 4.0000 mg | ORAL_TABLET | Freq: Three times a day (TID) | ORAL | 0 refills | Status: DC | PRN

## 2021-09-18 MED ORDER — OMEPRAZOLE 20 MG PO CPDR: 20.0000 mg | DELAYED_RELEASE_CAPSULE | Freq: Every day | ORAL | 0 refills | Status: DC

## 2021-09-18 MED ORDER — DICYCLOMINE HCL 20 MG PO TABS: 20.0000 mg | ORAL_TABLET | Freq: Four times a day (QID) | ORAL | 0 refills | Status: DC | PRN

## 2021-09-18 NOTE — ED Triage Notes (Signed)
Per interpreter: Pt c/o upper abdominal pain with burning all over and pain to bother shoulders since Friday. States having nausea. States when her stomach rumbles and moves through to her back she has diarrhea.  ?

## 2021-09-18 NOTE — Discharge Instructions (Addendum)
Ondansetron 4 mg can be dissolved in your mouth every 8 hours as needed for nausea or vomiting ? ?You can take dicyclomine 20 mg 1 every 6 hours as needed for intestinal cramps ? ?You can begin omeprazole 20 mg 1 daily for stomach acid ? ?I am glad you got her going to see your gastroenterologist next week ?

## 2021-09-18 NOTE — ED Provider Notes (Signed)
?[facility] ? ? ? ?CSN: [id number] ?Arrival date & time: 09/18/21  1439 ? ? ?  ? ?History   ?Chief Complaint ?Chief Complaint  ?Patient presents with  ? Abdominal Pain  ? ? ?HPI ?Deborah Sampson is a 61 y.o. female.  ? ? ?Abdominal Pain ?Here for upper abdominal pain that sometimes is radiating to her back.  It began 6 to 7 days ago and then is gotten worse in the last 2 or 3 days.  She notes that sometimes she has loose stools when her stomach rumbles.  Then she states she does not really have diarrhea.  And she also has nausea but no vomiting.  She has felt like she has fever or night sweats.  Please see previous visits here --she has been evaluated for night sweats in the fall of last year. ? ?No dysuria or hematuria.  It has been 2 years since she had a period.  She states she is no longer taking Dexilant.  She also states this is not like her usual pain for which she was treated with the [place]. ? ?Past Medical History:  ?Diagnosis Date  ? Acute appendicitis 11/21/2018  ? Back pain 11/27/2015  ? GERD (gastroesophageal reflux disease)   ? H. pylori infection 07/25/2019  ? Headache 12/15/2016  ? Palpitations 11/27/2015  ? Patient speaks only [place] 07/25/2019  ? Refugee health examination 11/27/2015  ? S/P laparoscopic appendectomy 11/21/2018  ? Scars, cultural scarification 11/27/2015  ? ? ?Patient Active Problem List  ? Diagnosis Date Noted  ? Single episode of elevated blood pressure 10/02/2020  ? PUD (peptic ulcer disease) 08/10/2019  ? Postmenopausal bleeding 07/25/2019  ? Patient speaks only [place] 07/25/2019  ?   07/25/2019  ? Fibroids 07/25/2019  ? Gastroesophageal reflux disease 03/08/2019  ? Hypopigmentation 03/15/2017  ? Chronic constipation 01/24/2016  ? ? ?Past Surgical History:  ?Procedure Laterality Date  ? LAPAROSCOPIC APPENDECTOMY N/A 11/21/2018  ? Procedure: APPENDECTOMY LAPAROSCOPIC;  Surgeon: Rolm Bookbinder, MD;  Location: [facility];  Service: General;  Laterality: N/A;  ? ? ?OB  History   ?No obstetric history on file. ?  ? ? ? ?Home Medications   ? ?Prior to Admission medications   ?Medication Sig Start Date End Date Taking? Authorizing Provider  ?dicyclomine (BENTYL) 20 MG tablet Take 1 tablet (20 mg total) by mouth 4 (four) times daily as needed (intestinal cramps). 09/18/21  Yes Barrett Henle, MD  ?omeprazole (PRILOSEC) 20 MG capsule Take 1 capsule (20 mg total) by mouth daily. 09/18/21  Yes Barrett Henle, MD  ?ondansetron (ZOFRAN-ODT) 4 MG disintegrating tablet Take 1 tablet (4 mg total) by mouth every 8 (eight) hours as needed for nausea or vomiting. 09/18/21  Yes Barrett Henle, MD  ?acetaminophen (TYLENOL) 500 MG tablet Take 2 tablets (1,000 mg total) by mouth every 6 (six) hours as needed. 05/03/21   Charlesetta Shanks, MD  ?polyethylene glycol powder (GLYCOLAX/MIRALAX) 17 GM/SCOOP powder Take 17 g by mouth 2 (two) times daily as needed. 10/02/20   Lenoria Chime, MD  ?white petrolatum (VASELINE) OINT Apply 1 application topically daily. To bilateral legs each evening before bed 11/22/20   Lenoria Chime, MD  ? ? ?Family History ?Family History  ?Family history unknown: Yes  ? ? ?Social History ?Social History  ? ?Tobacco Use  ? Smoking status: Former  ?  Types: Cigarettes  ?  Quit date: 04/05/2014  ?  Years since quitting: 7.4  ? Smokeless  tobacco: Never  ?Vaping Use  ? Vaping Use: Never used  ?Substance Use Topics  ? Alcohol use: Not Currently  ? Drug use: Never  ? ? ? ?Allergies   ?Penicillins ? ? ?Review of Systems ?Review of Systems  ?Gastrointestinal:  Positive for abdominal pain.  ? ? ?Physical Exam ?Triage Vital Signs ?ED Triage Vitals [09/18/21 1541]  ?Enc Vitals Group  ?   BP 133/83  ?   Pulse Rate 82  ?   Resp 18  ?   Temp 98 ?F (36.7 ?C)  ?   Temp Source Oral  ?   SpO2 99 %  ?   Weight   ?   Height   ?   Head Circumference   ?   Peak Flow   ?   Pain Score 8  ?   Pain Loc   ?   Pain Edu?   ?   Excl. in [place]?   ? ?No data found. ? ?Updated Vital Signs ?BP 133/83 (BP  Location: Left Arm)   Pulse 82   Temp 98 ?F (36.7 ?C) (Oral)   Resp 18   LMP 09/07/2018   SpO2 99%  ? ?Visual Acuity ?Right Eye Distance:   ?Left Eye Distance:   ?Bilateral Distance:   ? ?Right Eye Near:   ?Left Eye Near:    ?Bilateral Near:    ? ?Physical Exam ?Vitals reviewed.  ?Constitutional:   ?   General: She is not in acute distress. ?   Appearance: She is not toxic-appearing.  ?HENT:  ?   Mouth/Throat:  ?   Mouth: Mucous membranes are moist.  ?Eyes:  ?   Extraocular Movements: Extraocular movements intact.  ?   Conjunctiva/sclera: Conjunctivae normal.  ?   Pupils: Pupils are equal, round, and reactive to light.  ?Cardiovascular:  ?   Rate and Rhythm: Normal rate and regular rhythm.  ?   Heart sounds: No murmur heard. ?Pulmonary:  ?   Effort: Pulmonary effort is normal. No respiratory distress.  ?   Breath sounds: Normal breath sounds.  ?Abdominal:  ?   General: Bowel sounds are normal.  ?   Palpations: Abdomen is soft. There is no mass.  ?   Tenderness: There is abdominal tenderness (epigastric). There is no guarding.  ?   Comments: ? Mild distension  ?Musculoskeletal:  ?   Cervical back: Neck supple.  ?   Right lower leg: No edema.  ?   Left lower leg: No edema.  ?Lymphadenopathy:  ?   Cervical: No cervical adenopathy.  ?Skin: ?   Capillary Refill: Capillary refill takes less than 2 seconds.  ?   Coloration: Skin is not jaundiced or pale.  ?Neurological:  ?   General: No focal deficit present.  ?   Mental Status: She is alert and oriented to person, place, and time.  ?Psychiatric:     ?   Behavior: Behavior normal.  ? ? ? ?UC Treatments / Results  ?Labs ?(all labs ordered are listed, but only abnormal results are displayed) ?Labs Reviewed - No data to display ? ?EKG ? ? ?Radiology ?No results found. ? ?Procedures ?Procedures (including critical care time) ? ?Medications Ordered in UC ?Medications - No data to display ? ?Initial Impression / Assessment and Plan / UC Course  ?I have reviewed the triage  vital signs and the nursing notes. ? ?Pertinent labs & imaging results that were available during my care of the patient were reviewed by me  and considered in my medical decision making (see chart for details). ? ?  ? ?We will treat her symptoms of nausea and abdominal pain with Zofran and dicyclomine.  Also I will send in some omeprazole for her to try in the short-term.  She states she will be seeing her gastro doctor next week.  I offered plain films, with the caveat that it may or may not be very helpful.  She does not really see the need since she will be seeing her gastro doctor next week. ?This could be viral in origin, or could be related to her prior problems with gastritis ?Final Clinical Impressions(s) / UC Diagnoses  ? ?Final diagnoses:  ?Epigastric pain  ? ? ? ?Discharge Instructions   ? ?  ?Ondansetron 4 mg can be dissolved in your mouth every 8 hours as needed for nausea or vomiting ? ?You can take dicyclomine 20 mg 1 every 6 hours as needed for intestinal cramps ? ?You can begin omeprazole 20 mg 1 daily for stomach acid ? ?I am glad you got her going to see your gastroenterologist next week ? ? ? ? ?ED Prescriptions   ? ? Medication Sig Dispense Auth. Provider  ? dicyclomine (BENTYL) 20 MG tablet Take 1 tablet (20 mg total) by mouth 4 (four) times daily as needed (intestinal cramps). 40 tablet Joseantonio Dittmar, Gwenlyn Perking, MD  ? ondansetron (ZOFRAN-ODT) 4 MG disintegrating tablet Take 1 tablet (4 mg total) by mouth every 8 (eight) hours as needed for nausea or vomiting. 10 tablet Barrett Henle, MD  ? omeprazole (PRILOSEC) 20 MG capsule Take 1 capsule (20 mg total) by mouth daily. 30 capsule Barrett Henle, MD  ? ?  ? ?PDMP not reviewed this encounter. ?  ?Barrett Henle, MD ?09/18/21 1605 ? ?

## 2021-09-23 NOTE — Progress Notes (Signed)
? ? ?  SUBJECTIVE:  ? ?CHIEF COMPLAINT / HPI:  ? ?Epigastric pain-patient notes that she has had epigastric pain for "a while," but now it is getting too much.  She notes she used to only happen occasionally but now she is having constant pain.  Sometimes when she has that pain she is sweating.  But she has not had any measured fevers or chills outside of when she is having the pain.  She notes it has been worse for the past 3 weeks.  She notes it is a burning pain.  She denies any vomiting or hematemesis.  He notes she has a bowel movement daily but it is a little hard and constipated.  Denies a history of diarrhea.  Denies bloody or black stools.  She notes some nausea associated with this.  She notes she has been amenorrhea for the past 2 years.  She notes she went to urgent care recently and was given omeprazole and Bentyl which has helped some.  She denies any triggers for this abdominal pain is not worse with eating or pooping and just comes on.  She notes her mouth feels dry when this happened.  She denies any lower abdominal pain, dysuria, blood in urine.  Her last bowel movement was yesterday.  Denies fevers or chills.  She has had appendicitis and her appendix removed, denies any of other abdominal surgeries.  EGD 04/2020 with erythematous mucosa in gastric body. Colonoscopy with internal hemorrhoids and 2 polyps.  ? ?PERTINENT  PMH / PSH: As above ? ?OBJECTIVE:  ? ?BP 110/64   Pulse 67   Wt 183 lb (83 kg)   LMP 09/07/2018   SpO2 97%   BMI 30.45 kg/m?   ?General: A&O, NAD ?HEENT: No sign of trauma, EOM grossly intact ?Cardiac: RRR, no m/r/g ?Respiratory: CTAB, normal WOB, no w/c/r ?GI: Soft, NTTP, non-distended, no guarding or rebound, no CVA tenderness bilaterally, negative Murphy sign ?Extremities: NTTP, no peripheral edema. ?Neuro: Normal gait, moves all four extremities appropriately. ?Psych: Appropriate mood and affect ? ? ?ASSESSMENT/PLAN:  ? ?Epigastric abdominal pain ?- Nonspecific epigastric  pain, exam benign today without red flag signs or symptoms, will get lab work and right upper quadrant ultrasound as there seems to be possibly some association with eating although she cannot really pinpoint this ?- Previous CT abdomen pelvis in 2020 showed appendicitis but no gallstones or other abnormalities ?- Discussed red flag signs or symptoms and reasons to go to the ER including fevers, chills, worsening Donnell pain, nausea and inability to keep anything down ?- Scheduled right upper quadrant ultrasound and close follow-up with me next week given date and time of these appointments to patient today, answered all questions ? ?Chronic constipation ?- Describes daily hard stools, will start MiraLAX daily to help with this, unsure if this is contributing to her epigastric pain ?  ? ? ?Lenoria Chime, MD ?[facility]  ? ?

## 2021-09-24 ENCOUNTER — Other Ambulatory Visit: Payer: Self-pay

## 2021-09-24 ENCOUNTER — Ambulatory Visit (INDEPENDENT_AMBULATORY_CARE_PROVIDER_SITE_OTHER): Payer: Self-pay | Admitting: Family Medicine

## 2021-09-24 VITALS — BP 110/64 | HR 67 | Wt 183.0 lb

## 2021-09-24 DIAGNOSIS — K5909 Other constipation: Secondary | ICD-10-CM

## 2021-09-24 DIAGNOSIS — R1013 Epigastric pain: Secondary | ICD-10-CM | POA: Insufficient documentation

## 2021-09-24 MED ORDER — POLYETHYLENE GLYCOL 3350 17 GM/SCOOP PO POWD: 17.0000 g | Freq: Two times a day (BID) | ORAL | 1 refills | Status: DC | PRN

## 2021-09-24 MED ORDER — POLYETHYLENE GLYCOL 3350 17 GM/SCOOP PO POWD: 17.0000 g | Freq: Once | ORAL | 1 refills | Status: AC

## 2021-09-24 NOTE — Assessment & Plan Note (Signed)
-   Nonspecific epigastric pain, exam benign today without red flag signs or symptoms, will get lab work and right upper quadrant ultrasound as there seems to be possibly some association with eating although she cannot really pinpoint this ?- Previous CT abdomen pelvis in 2020 showed appendicitis but no gallstones or other abnormalities ?- Discussed red flag signs or symptoms and reasons to go to the ER including fevers, chills, worsening Donnell pain, nausea and inability to keep anything down ?- Scheduled right upper quadrant ultrasound and close follow-up with me next week given date and time of these appointments to patient today, answered all questions ?

## 2021-09-24 NOTE — Patient Instructions (Addendum)
It was wonderful to see you today. ? ?Please bring ALL of your medications with you to every visit.  ? ?Today we talked about: ? ?We will get lab work and an [place] today.  ?We will see you back on Friday 10/03/21. ? ? ?Thank you for choosing [facility].  ? ?Please call [phone] with any questions about today's appointment. ? ?Please be sure to schedule follow up at the front  desk before you leave today.  ? ?Please arrive at least 15 minutes prior to your scheduled appointments. ?  ?If you had blood work today, I will send you a MyChart message or a letter if results are normal. Otherwise, I will give you a call. ?  ?If you had a referral placed, they will call you to set up an appointment. Please give [place] a call if you don't hear back in the next 2 weeks. ?  ?If you need additional refills before your next appointment, please call your pharmacy first.  ? ?Yehuda Savannah, MD  ?Family Medicine   ?

## 2021-09-24 NOTE — Assessment & Plan Note (Signed)
-   Describes daily hard stools, will start MiraLAX daily to help with this, unsure if this is contributing to her epigastric pain ?

## 2021-09-24 NOTE — Assessment & Plan Note (Signed)
>>  ASSESSMENT AND PLAN FOR EPIGASTRIC ABDOMINAL PAIN WRITTEN ON 09/24/2021 11:37 AM BY AY, MARGARET E, MD  - Nonspecific epigastric pain, exam benign today without red flag signs or symptoms, will get lab work and right upper quadrant ultrasound as there seems to be possibly some association with eating although she cannot really pinpoint this - Previous CT abdomen pelvis in 2020 showed appendicitis but no gallstones or other abnormalities - Discussed red flag signs or symptoms and reasons to go to the ER including fevers, chills, worsening Donnell pain, nausea and inability to keep anything down - Scheduled right upper quadrant ultrasound and close follow-up with me next week given date and time of these appointments to patient today, answered all questions

## 2021-09-25 LAB — CBC WITH DIFFERENTIAL/PLATELET
Basophils Absolute: 0 10*3/uL (ref 0.0–0.2)
Basos: 0 %
EOS (ABSOLUTE): 0.1 10*3/uL (ref 0.0–0.4)
Eos: 1 %
Hematocrit: 39.5 % (ref 34.0–46.6)
Hemoglobin: 13.2 g/dL (ref 11.1–15.9)
Immature Grans (Abs): 0 10*3/uL (ref 0.0–0.1)
Immature Granulocytes: 0 %
Lymphocytes Absolute: 1.9 10*3/uL (ref 0.7–3.1)
Lymphs: 37 %
MCH: 27.3 pg (ref 26.6–33.0)
MCHC: 33.4 g/dL (ref 31.5–35.7)
MCV: 82 fL (ref 79–97)
Monocytes Absolute: 0.4 10*3/uL (ref 0.1–0.9)
Monocytes: 8 %
Neutrophils Absolute: 2.8 10*3/uL (ref 1.4–7.0)
Neutrophils: 54 %
Platelets: 277 10*3/uL (ref 150–450)
RBC: 4.83 x10E6/uL (ref 3.77–5.28)
RDW: 13 % (ref 11.7–15.4)
WBC: 5.2 10*3/uL (ref 3.4–10.8)

## 2021-09-25 LAB — COMPREHENSIVE METABOLIC PANEL
ALT: 20 IU/L (ref 0–32)
AST: 20 IU/L (ref 0–40)
Albumin/Globulin Ratio: 1.6 (ref 1.2–2.2)
Albumin: 4.4 g/dL (ref 3.8–4.9)
Alkaline Phosphatase: 84 IU/L (ref 44–121)
BUN/Creatinine Ratio: 15 (ref 9–23)
BUN: 11 mg/dL (ref 6–24)
Bilirubin Total: 0.3 mg/dL (ref 0.0–1.2)
CO2: 23 mmol/L (ref 20–29)
Calcium: 9.8 mg/dL (ref 8.7–10.2)
Chloride: 104 mmol/L (ref 96–106)
Creatinine, Ser: 0.74 mg/dL (ref 0.57–1.00)
Globulin, Total: 2.8 g/dL (ref 1.5–4.5)
Glucose: 152 mg/dL — ABNORMAL HIGH (ref 70–99)
Potassium: 5 mmol/L (ref 3.5–5.2)
Sodium: 144 mmol/L (ref 134–144)
Total Protein: 7.2 g/dL (ref 6.0–8.5)
eGFR: 93 mL/min/{1.73_m2} (ref >59)

## 2021-09-25 LAB — LIPASE: Lipase: 39 U/L (ref 14–72)

## 2021-09-30 ENCOUNTER — Ambulatory Visit (HOSPITAL_COMMUNITY)
Admission: RE | Admit: 2021-09-30 | Discharge: 2021-09-30 | Disposition: A | Discharge status: Home / Self Care | Payer: BC Managed Care – PPO | Source: Ambulatory Visit | Attending: Family Medicine | Admitting: Family Medicine

## 2021-09-30 ENCOUNTER — Other Ambulatory Visit: Payer: Self-pay

## 2021-09-30 DIAGNOSIS — R1013 Epigastric pain: Secondary | ICD-10-CM | POA: Diagnosis present

## 2021-10-03 ENCOUNTER — Encounter: Payer: Self-pay | Admitting: Family Medicine

## 2021-10-03 ENCOUNTER — Ambulatory Visit (INDEPENDENT_AMBULATORY_CARE_PROVIDER_SITE_OTHER): Payer: Self-pay | Admitting: Family Medicine

## 2021-10-03 VITALS — BP 141/76 | HR 76 | Wt 185.2 lb

## 2021-10-03 DIAGNOSIS — K5909 Other constipation: Secondary | ICD-10-CM

## 2021-10-03 DIAGNOSIS — K219 Gastro-esophageal reflux disease without esophagitis: Secondary | ICD-10-CM

## 2021-10-03 MED ORDER — DEXLANSOPRAZOLE 60 MG PO CPDR: 60.0000 mg | DELAYED_RELEASE_CAPSULE | Freq: Every day | ORAL | 1 refills | Status: DC

## 2021-10-03 NOTE — Progress Notes (Signed)
? ? ?  SUBJECTIVE:  ? ?CHIEF COMPLAINT / HPI:  ? ?Epigastric pain- RUQ sono without stones or sludge or inflammation of gallbladder. CMP without signs of liver dysfunction, lipase normal, CBC without signs of infection or anemia. She is still having same symptoms, taking once daily omeprazole and TID bentyl, only minimal relief. ? ?Constipation- started on daily miralax. Now having nonbloody soft daily stool. ? ?Kinyarwanda interpretor Rosanna Randy present for entirety of encounter. ? ?PERTINENT  PMH / PSH: GERD, constipation ? ?OBJECTIVE:  ? ?BP (!) 141/76   Pulse 76   Wt 185 lb 3.2 oz (84 kg)   LMP 09/07/2018   SpO2 100%   BMI 30.82 kg/m?   ?General: A&O, NAD ?HEENT: No sign of trauma, EOM grossly intact ?Cardiac: RRR, no m/r/g ?Respiratory: CTAB, normal WOB, no w/c/r ?GI: Soft, NTTP, non-distended  ?Extremities: NTTP, no peripheral edema. ?Neuro: Normal gait, moves all four extremities appropriately. ?Psych: Appropriate mood and affect ? ? ?ASSESSMENT/PLAN:  ? ?Gastroesophageal reflux disease ?- has trialed many PPI and best results in past with Dexilant '60mg'$  XR, sent to pharmacy due to no improvement withother PPI ?- f/u in 5-6 weeks, if no improvement consider referral back to GI to repeat EGD ? ?Chronic constipation ?- continue daily miralax ?  ?Discussed need for mammogram, patient states she will consider and let me know. Discussed importance of screening for breast cancer. ?Notes has had two COVID vaccines from work. Declines booster. ?Declines flu vaccine. ? ?Lenoria Chime, MD ?[facility]  ? ?

## 2021-10-03 NOTE — Patient Instructions (Signed)
It was wonderful to see you today. ? ?Please bring ALL of your medications with you to every visit.  ? ?Today we talked about: ? ?START DEXILANT ? ? ? ?STOP OMEPRAZOLE ? ? ?Thank you for choosing [facility].  ? ?Please call [phone] with any questions about today's appointment. ? ?Please be sure to schedule follow up at the front  desk before you leave today.  ? ?Please arrive at least 15 minutes prior to your scheduled appointments. ?  ?If you had blood work today, I will send you a MyChart message or a letter if results are normal. Otherwise, I will give you a call. ?  ?If you had a referral placed, they will call you to set up an appointment. Please give [place] a call if you don't hear back in the next 2 weeks. ?  ?If you need additional refills before your next appointment, please call your pharmacy first.  ? ?Yehuda Savannah, MD  ?Family Medicine   ?

## 2021-10-03 NOTE — Assessment & Plan Note (Signed)
-   has trialed many PPI and best results in past with Dexilant '60mg'$  XR, sent to pharmacy due to no improvement withother PPI ?- f/u in 5-6 weeks, if no improvement consider referral back to GI to repeat EGD ?

## 2021-10-03 NOTE — Assessment & Plan Note (Signed)
-   continue daily miralax ?

## 2021-11-20 ENCOUNTER — Ambulatory Visit: Payer: Self-pay | Admitting: Family Medicine

## 2022-03-30 ENCOUNTER — Encounter (HOSPITAL_COMMUNITY): Payer: Self-pay | Admitting: *Deleted

## 2022-03-30 ENCOUNTER — Ambulatory Visit (HOSPITAL_COMMUNITY)
Admission: EM | Admit: 2022-03-30 | Discharge: 2022-03-30 | Disposition: A | Discharge status: Home / Self Care | Payer: Self-pay | Attending: Internal Medicine | Admitting: Internal Medicine

## 2022-03-30 ENCOUNTER — Other Ambulatory Visit: Payer: Self-pay

## 2022-03-30 DIAGNOSIS — K2900 Acute gastritis without bleeding: Secondary | ICD-10-CM

## 2022-03-30 MED ORDER — PANTOPRAZOLE SODIUM 20 MG PO TBEC: 20.0000 mg | DELAYED_RELEASE_TABLET | Freq: Every day | ORAL | 0 refills | Status: DC

## 2022-03-30 MED ORDER — SUCRALFATE 1 G PO TABS: 1.0000 g | ORAL_TABLET | Freq: Three times a day (TID) | ORAL | 0 refills | Status: DC

## 2022-03-30 MED ORDER — ALUM & MAG HYDROXIDE-SIMETH 200-200-20 MG/5ML PO SUSP
30.0000 mL | Freq: Once | ORAL | Status: AC
  Administered 2022-03-30: 30 mL via ORAL

## 2022-03-30 MED ORDER — ALUM & MAG HYDROXIDE-SIMETH 200-200-20 MG/5ML PO SUSP
ORAL | Status: AC
  Filled 2022-03-30: qty 30

## 2022-03-30 MED ORDER — LIDOCAINE VISCOUS HCL 2 % MT SOLN
OROMUCOSAL | Status: AC
  Filled 2022-03-30: qty 15

## 2022-03-30 MED ORDER — LIDOCAINE VISCOUS HCL 2 % MT SOLN
15.0000 mL | Freq: Once | OROMUCOSAL | Status: AC
  Administered 2022-03-30: 15 mL via ORAL

## 2022-03-30 NOTE — Discharge Instructions (Addendum)
Please take medications as prescribed Maintain adequate hydration If you continue to have symptoms in spite of being on medications for 30 days, you will benefit from gastroenterology evaluation.

## 2022-03-30 NOTE — ED Triage Notes (Signed)
Pt reports having ABD pain for more sever for past few weeks. Pt reports having ABD pain for a while . Pt has nausea but does not vomit.

## 2022-03-30 NOTE — ED Provider Notes (Signed)
[facility]    CSN: [id number] Arrival date & time: 03/30/22  1320      History   Chief Complaint Chief Complaint  Patient presents with   Abdominal Pain    HPI Deborah Sampson is a 61 y.o. female comes to the urgent care with epigastric abdominal pain of 2 to 3 weeks duration.  Patient's abdominal pain started insidiously and has been persistent.  She says pain is of moderate severity and radiates to the back.  No nausea or vomiting.  No change in bowel movements.  No melanotic stool.  No weight loss.  Patient has some night sweats.  No shortness of breath or wheezing.  Pain is not aggravated by food or hunger.  No fever or chills.  No yellowing of her eyes.  No heavy lifting or trauma to the abdomen.   HPI  Past Medical History:  Diagnosis Date   Acute appendicitis 11/21/2018   Back pain 11/27/2015   GERD (gastroesophageal reflux disease)    H. pylori infection 07/25/2019   Headache 12/15/2016   Palpitations 11/27/2015   Patient speaks only [place] 07/25/2019   [facility] examination 11/27/2015   S/P laparoscopic appendectomy 11/21/2018   Scars, cultural scarification 11/27/2015    Patient Active Problem List   Diagnosis Date Noted   Epigastric abdominal pain 09/24/2021   PUD (peptic ulcer disease) 08/10/2019   Postmenopausal bleeding 07/25/2019     07/25/2019   Fibroids 07/25/2019   Gastroesophageal reflux disease 03/08/2019   Chronic constipation 01/24/2016    Past Surgical History:  Procedure Laterality Date   LAPAROSCOPIC APPENDECTOMY N/A 11/21/2018   Procedure: APPENDECTOMY LAPAROSCOPIC;  Surgeon: Rolm Bookbinder, MD;  Location: [facility];  Service: General;  Laterality: N/A;    OB History   No obstetric history on file.      Home Medications    Prior to Admission medications   Medication Sig Start Date End Date Taking? Authorizing Provider  pantoprazole (PROTONIX) 20 MG tablet Take 1 tablet (20 mg total) by mouth daily. 03/30/22  Yes Louie Flenner,  Myrene Galas, MD  sucralfate (CARAFATE) 1 g tablet Take 1 tablet (1 g total) by mouth 3 (three) times daily before meals for 10 days. 03/30/22 04/09/22 Yes Elesha Thedford, Myrene Galas, MD    Family History Family History  Family history unknown: Yes    Social History Social History   Tobacco Use   Smoking status: Former    Types: Cigarettes    Quit date: 04/05/2014    Years since quitting: 7.9   Smokeless tobacco: Never  Vaping Use   Vaping Use: Never used  Substance Use Topics   Alcohol use: Not Currently   Drug use: Never     Allergies   Penicillins   Review of Systems Review of Systems  HENT: Negative.    Gastrointestinal:  Positive for abdominal pain. Negative for constipation, diarrhea, nausea and vomiting.  Genitourinary: Negative.   Musculoskeletal: Negative.   Neurological: Negative.      Physical Exam Triage Vital Signs ED Triage Vitals  Enc Vitals Group     BP 03/30/22 1407 (!) 142/86     Pulse Rate 03/30/22 1407 66     Resp 03/30/22 1407 18     Temp 03/30/22 1407 97.8 F (36.6 C)     Temp src --      SpO2 03/30/22 1407 98 %     Weight --      Height --      Head Circumference --  Peak Flow --      Pain Score 03/30/22 1401 8     Pain Loc --      Pain Edu? --      Excl. in [place]? --    No data found.  Updated Vital Signs BP (!) 142/86   Pulse 66   Temp 97.8 F (36.6 C)   Resp 18   LMP 09/07/2018   SpO2 98%   Visual Acuity Right Eye Distance:   Left Eye Distance:   Bilateral Distance:    Right Eye Near:   Left Eye Near:    Bilateral Near:     Physical Exam Vitals and nursing note reviewed.  Constitutional:      General: She is not in acute distress.    Appearance: She is not ill-appearing.  Cardiovascular:     Rate and Rhythm: Normal rate and regular rhythm.     Heart sounds: Normal heart sounds.  Pulmonary:     Effort: Pulmonary effort is normal.     Breath sounds: Normal breath sounds.  Abdominal:     Palpations: Abdomen is soft.  There is no hepatomegaly or splenomegaly.     Tenderness: There is abdominal tenderness in the epigastric area. There is no guarding or rebound.  Neurological:     Mental Status: She is alert.      UC Treatments / Results  Labs (all labs ordered are listed, but only abnormal results are displayed) Labs Reviewed - No data to display  EKG   Radiology No results found.  Procedures Procedures (including critical care time)  Medications Ordered in UC Medications  alum & mag hydroxide-simeth (MAALOX/MYLANTA) 200-200-20 MG/5ML suspension 30 mL (30 mLs Oral Given 03/30/22 1455)    And  lidocaine (XYLOCAINE) 2 % viscous mouth solution 15 mL (15 mLs Oral Given 03/30/22 1455)    Initial Impression / Assessment and Plan / UC Course  I have reviewed the triage vital signs and the nursing notes.  Pertinent labs & imaging results that were available during my care of the patient were reviewed by me and considered in my medical decision making (see chart for details).     1.  Acute gastritis without hemorrhage: GI cocktail Protonix 20 mg orally daily Carafate before meals. He is return to urgent care if symptoms worsen. Final Clinical Impressions(s) / UC Diagnoses   Final diagnoses:  Acute superficial gastritis without hemorrhage     Discharge Instructions      Please take medications as prescribed Maintain adequate hydration If you continue to have symptoms in spite of being on medications for 30 days, you will benefit from gastroenterology evaluation.   ED Prescriptions     Medication Sig Dispense Auth. Provider   pantoprazole (PROTONIX) 20 MG tablet Take 1 tablet (20 mg total) by mouth daily. 30 tablet Chayson Charters, Myrene Galas, MD   sucralfate (CARAFATE) 1 g tablet Take 1 tablet (1 g total) by mouth 3 (three) times daily before meals for 10 days. 30 tablet Tamme Mozingo, Myrene Galas, MD      PDMP not reviewed this encounter.   Chase Picket, MD 03/30/22 [phone]

## 2022-04-29 NOTE — Telephone Encounter (Signed)
Note not needed 

## 2022-08-17 ENCOUNTER — Ambulatory Visit (HOSPITAL_COMMUNITY)
Admission: EM | Admit: 2022-08-17 | Discharge: 2022-08-17 | Disposition: A | Discharge status: Home / Self Care | Payer: 59 | Attending: Internal Medicine | Admitting: Internal Medicine

## 2022-08-17 ENCOUNTER — Encounter (HOSPITAL_COMMUNITY): Payer: Self-pay | Admitting: Emergency Medicine

## 2022-08-17 DIAGNOSIS — B349 Viral infection, unspecified: Secondary | ICD-10-CM | POA: Diagnosis present

## 2022-08-17 LAB — COMPREHENSIVE METABOLIC PANEL
ALT: 16 U/L (ref 0–44)
AST: 28 U/L (ref 15–41)
Albumin: 3.6 g/dL (ref 3.5–5.0)
Alkaline Phosphatase: 68 U/L (ref 38–126)
Anion gap: 10 (ref 5–15)
BUN: 10 mg/dL (ref 6–20)
CO2: 25 mmol/L (ref 22–32)
Calcium: 9 mg/dL (ref 8.9–10.3)
Chloride: 102 mmol/L (ref 98–111)
Creatinine, Ser: 0.66 mg/dL (ref 0.44–1.00)
GFR, Estimated: >60 mL/min (ref >60)
Glucose, Bld: 116 mg/dL — ABNORMAL HIGH (ref 70–99)
Potassium: 4.2 mmol/L (ref 3.5–5.1)
Sodium: 137 mmol/L (ref 135–145)
Total Bilirubin: 0.9 mg/dL (ref 0.3–1.2)
Total Protein: 7.4 g/dL (ref 6.5–8.1)

## 2022-08-17 LAB — CBC WITH DIFFERENTIAL/PLATELET
Abs Immature Granulocytes: 0.01 10*3/uL (ref 0.00–0.07)
Basophils Absolute: 0 10*3/uL (ref 0.0–0.1)
Basophils Relative: 1 %
Eosinophils Absolute: 0.1 10*3/uL (ref 0.0–0.5)
Eosinophils Relative: 1 %
HCT: 41 % (ref 36.0–46.0)
Hemoglobin: 13.8 g/dL (ref 12.0–15.0)
Immature Granulocytes: 0 %
Lymphocytes Relative: 45 %
Lymphs Abs: 1.9 10*3/uL (ref 0.7–4.0)
MCH: 28.2 pg (ref 26.0–34.0)
MCHC: 33.7 g/dL (ref 30.0–36.0)
MCV: 83.7 fL (ref 80.0–100.0)
Monocytes Absolute: 0.3 10*3/uL (ref 0.1–1.0)
Monocytes Relative: 7 %
Neutro Abs: 2 10*3/uL (ref 1.7–7.7)
Neutrophils Relative %: 46 %
Platelets: 290 10*3/uL (ref 150–400)
RBC: 4.9 MIL/uL (ref 3.87–5.11)
RDW: 12.2 % (ref 11.5–15.5)
WBC: 4.3 10*3/uL (ref 4.0–10.5)
nRBC: 0 % (ref 0.0–0.2)

## 2022-08-17 LAB — TSH: TSH: 0.647 u[IU]/mL (ref 0.350–4.500)

## 2022-08-17 MED ORDER — BIOTIN 3 MG PO TABS: 1.0000 | ORAL_TABLET | Freq: Every day | ORAL | 0 refills | Status: DC

## 2022-08-17 MED ORDER — PANTOPRAZOLE SODIUM 20 MG PO TBEC: 20.0000 mg | DELAYED_RELEASE_TABLET | Freq: Every day | ORAL | 0 refills | Status: DC

## 2022-08-17 MED ORDER — IBUPROFEN 600 MG PO TABS: 600.0000 mg | ORAL_TABLET | Freq: Four times a day (QID) | ORAL | 0 refills | Status: DC | PRN

## 2022-08-17 NOTE — ED Provider Notes (Signed)
[facility]    CSN: [id number] Arrival date & time: 08/17/22  1208      History   Chief Complaint Chief Complaint  Patient presents with   Fever   Fatigue    HPI Deborah Sampson is a 62 y.o. female comes to the urgent care with 4-day history of generalized bodyaches, subjective fever and chills.  Patient denies any shortness of breath or wheezing.  She has some chest congestion with no sputum production.  She denies any nausea, vomiting or diarrhea.  No sick contacts.  She works at the [employer].  Patient's husband has similar symptoms.  She denies any wheezing or chest tightness.  Patient feels unusually fatigued.  She denies any sore throat.   HPI  Past Medical History:  Diagnosis Date   Acute appendicitis 11/21/2018   Back pain 11/27/2015   GERD (gastroesophageal reflux disease)    H. pylori infection 07/25/2019   Headache 12/15/2016   Palpitations 11/27/2015   Patient speaks only [place] 07/25/2019   Refugee health examination 11/27/2015   S/P laparoscopic appendectomy 11/21/2018   Scars, cultural scarification 11/27/2015    Patient Active Problem List   Diagnosis Date Noted   Epigastric abdominal pain 09/24/2021   PUD (peptic ulcer disease) 08/10/2019   Postmenopausal bleeding 07/25/2019     07/25/2019   Fibroids 07/25/2019   Gastroesophageal reflux disease 03/08/2019   Chronic constipation 01/24/2016    Past Surgical History:  Procedure Laterality Date   APPENDECTOMY     LAPAROSCOPIC APPENDECTOMY N/A 11/21/2018   Procedure: APPENDECTOMY LAPAROSCOPIC;  Surgeon: Rolm Bookbinder, MD;  Location: [facility];  Service: General;  Laterality: N/A;    OB History   No obstetric history on file.      Home Medications    Prior to Admission medications   Medication Sig Start Date End Date Taking? Authorizing Provider  Biotin 3 MG TABS Take 1 tablet (3 mg total) by mouth daily. 08/17/22  Yes Annalise Mcdiarmid, Myrene Galas, MD  ibuprofen (ADVIL) 600 MG tablet  Take 1 tablet (600 mg total) by mouth every 6 (six) hours as needed. 08/17/22  Yes Shaquasha Gerstel, Myrene Galas, MD  pantoprazole (PROTONIX) 20 MG tablet Take 1 tablet (20 mg total) by mouth daily. 08/17/22   LampteyMyrene Galas, MD    Family History Family History  Family history unknown: Yes    Social History Social History   Tobacco Use   Smoking status: Former    Types: Cigarettes    Quit date: 04/05/2014    Years since quitting: 8.3   Smokeless tobacco: Never  Vaping Use   Vaping Use: Never used  Substance Use Topics   Alcohol use: Not Currently   Drug use: Never     Allergies   Penicillins   Review of Systems Review of Systems As per HPI  Physical Exam Triage Vital Signs ED Triage Vitals [08/17/22 1402]  Enc Vitals Group     BP (!) 148/81     Pulse Rate 70     Resp 17     Temp 97.6 F (36.4 C)     Temp Source Oral     SpO2 98 %     Weight      Height      Head Circumference      Peak Flow      Pain Score      Pain Loc      Pain Edu?      Excl. in [place]?  No data found.  Updated Vital Signs BP (!) 148/81 (BP Location: Right Arm)   Pulse 70   Temp 97.6 F (36.4 C) (Oral)   Resp 17   LMP 09/07/2018   SpO2 98%   Visual Acuity Right Eye Distance:   Left Eye Distance:   Bilateral Distance:    Right Eye Near:   Left Eye Near:    Bilateral Near:     Physical Exam Vitals and nursing note reviewed.  Constitutional:      General: She is not in acute distress.    Appearance: She is not ill-appearing.  HENT:     Right Ear: Tympanic membrane normal.     Left Ear: Tympanic membrane normal.     Mouth/Throat:     Mouth: Mucous membranes are moist.     Pharynx: No posterior oropharyngeal erythema.  Cardiovascular:     Rate and Rhythm: Normal rate and regular rhythm.     Pulses: Normal pulses.     Heart sounds: Normal heart sounds.  Pulmonary:     Effort: Pulmonary effort is normal.     Breath sounds: Normal breath sounds.  Abdominal:     General: Bowel  sounds are normal.     Palpations: Abdomen is soft.  Neurological:     Mental Status: She is alert.      UC Treatments / Results  Labs (all labs ordered are listed, but only abnormal results are displayed) Labs Reviewed  COMPREHENSIVE METABOLIC PANEL  CBC WITH DIFFERENTIAL/PLATELET  TSH    EKG   Radiology No results found.  Procedures Procedures (including critical care time)  Medications Ordered in UC Medications - No data to display  Initial Impression / Assessment and Plan / UC Course  I have reviewed the triage vital signs and the nursing notes.  Pertinent labs & imaging results that were available during my care of the patient were reviewed by me and considered in my medical decision making (see chart for details).     1.  Viral syndrome: CBC, CMP, TSH Ibuprofen 600 mg every 6 hours as needed for pain and/or fever Biotin 3 mg orally daily Protonix 20 mg orally daily Will call patient with recommendations if labs are abnormal Return precautions given. Final Clinical Impressions(s) / UC Diagnoses   Final diagnoses:  Viral syndrome     Discharge Instructions      Please increase oral fluid intake Will call you with recommendations if labs are abnormal Please take prescription medications as directed Return to urgent care if symptoms worsen.   ED Prescriptions     Medication Sig Dispense Auth. Provider   pantoprazole (PROTONIX) 20 MG tablet Take 1 tablet (20 mg total) by mouth daily. 30 tablet Hasson Gaspard, Myrene Galas, MD   ibuprofen (ADVIL) 600 MG tablet Take 1 tablet (600 mg total) by mouth every 6 (six) hours as needed. 30 tablet Douglas Rooks, Myrene Galas, MD   Biotin 3 MG TABS Take 1 tablet (3 mg total) by mouth daily. 30 tablet Danial Sisley, Myrene Galas, MD      PDMP not reviewed this encounter.   Chase Picket, MD 08/17/22 [phone]

## 2022-08-17 NOTE — ED Triage Notes (Signed)
Used interpretor  \ Pt c/o fever, "water running in the head", fatigue that been ongoing for 4 days. Took "some pills" last night for fever. Clarified that was ibuprofen 400 mg.

## 2022-08-17 NOTE — Discharge Instructions (Addendum)
Please increase oral fluid intake Will call you with recommendations if labs are abnormal Please take prescription medications as directed Return to urgent care if symptoms worsen.

## 2022-10-15 ENCOUNTER — Encounter (HOSPITAL_COMMUNITY): Payer: Self-pay

## 2022-10-15 ENCOUNTER — Ambulatory Visit (HOSPITAL_COMMUNITY)
Admission: EM | Admit: 2022-10-15 | Discharge: 2022-10-15 | Disposition: A | Discharge status: Home / Self Care | Payer: Self-pay | Attending: Sports Medicine | Admitting: Sports Medicine

## 2022-10-15 DIAGNOSIS — K5900 Constipation, unspecified: Secondary | ICD-10-CM

## 2022-10-15 DIAGNOSIS — R1011 Right upper quadrant pain: Secondary | ICD-10-CM

## 2022-10-15 DIAGNOSIS — R110 Nausea: Secondary | ICD-10-CM

## 2022-10-15 LAB — POCT URINALYSIS DIPSTICK, ED / UC
Bilirubin Urine: NEGATIVE
Glucose, UA: NEGATIVE mg/dL
Hgb urine dipstick: NEGATIVE
Ketones, ur: NEGATIVE mg/dL
Leukocytes,Ua: NEGATIVE
Nitrite: NEGATIVE
Protein, ur: NEGATIVE mg/dL
Specific Gravity, Urine: 1.02 (ref 1.005–1.030)
Urobilinogen, UA: 0.2 mg/dL (ref 0.0–1.0)
pH: 6 (ref 5.0–8.0)

## 2022-10-15 MED ORDER — ONDANSETRON HCL 4 MG PO TABS: 4.0000 mg | ORAL_TABLET | Freq: Once | ORAL | Status: DC

## 2022-10-15 MED ORDER — ONDANSETRON 4 MG PO TBDP
4.0000 mg | ORAL_TABLET | Freq: Once | ORAL | Status: AC
  Administered 2022-10-15: 4 mg via ORAL

## 2022-10-15 MED ORDER — ONDANSETRON 4 MG PO TBDP
ORAL_TABLET | ORAL | Status: AC
  Filled 2022-10-15: qty 1

## 2022-10-15 NOTE — ED Provider Notes (Signed)
[place]    CSN: [id number] Arrival date & time: 10/15/22  0908      History   Chief Complaint Chief Complaint  Patient presents with   Cough   Generalized Body Aches   Abdominal Pain   Back Pain    HPI Deborah Sampson is a 62 y.o. female.   Patient is here today again with chief complaint of nausea abdominal pain that wraps around to her back.  She reports this has been persistent for the past 2 weeks.  She has a history of this in the past.  She has not followed up with her primary care provider or GI specialist at this time.  She denies any sick contacts but reports subjective chills.  She denies any vomiting or diarrhea but does report decreased appetite.  Her pain is sometimes worse when she eats and sometimes better when she eats.  She reports she has some constipation.  She did have a bowel movement this morning that was very hard to pass.  In the past she has been on PPIs however she ran out of her medication and she has not seen her primary for refill.  Denies any chest pain, dark stools or bloody stools, or shortness of breath.   Cough Abdominal Pain Associated symptoms: cough   Back Pain Associated symptoms: abdominal pain     Past Medical History:  Diagnosis Date   Acute appendicitis 11/21/2018   Back pain 11/27/2015   GERD (gastroesophageal reflux disease)    H. pylori infection 07/25/2019   Headache 12/15/2016   Palpitations 11/27/2015   Patient speaks only [place] 07/25/2019   [facility] health examination 11/27/2015   S/P laparoscopic appendectomy 11/21/2018   Scars, cultural scarification 11/27/2015    Patient Active Problem List   Diagnosis Date Noted   Epigastric abdominal pain 09/24/2021   PUD (peptic ulcer disease) 08/10/2019   Postmenopausal bleeding 07/25/2019     07/25/2019   Fibroids 07/25/2019   Gastroesophageal reflux disease 03/08/2019   Chronic constipation 01/24/2016    Past Surgical History:  Procedure Laterality Date    APPENDECTOMY     LAPAROSCOPIC APPENDECTOMY N/A 11/21/2018   Procedure: APPENDECTOMY LAPAROSCOPIC;  Surgeon: Rolm Bookbinder, MD;  Location: [facility];  Service: General;  Laterality: N/A;    OB History   No obstetric history on file.      Home Medications    Prior to Admission medications   Medication Sig Start Date End Date Taking? Authorizing Provider  Biotin 3 MG TABS Take 1 tablet (3 mg total) by mouth daily. 08/17/22   Chase Picket, MD  ibuprofen (ADVIL) 600 MG tablet Take 1 tablet (600 mg total) by mouth every 6 (six) hours as needed. 08/17/22   Chase Picket, MD  pantoprazole (PROTONIX) 20 MG tablet Take 1 tablet (20 mg total) by mouth daily. 08/17/22   Lamptey, Myrene Galas, MD    Family History Family History  Family history unknown: Yes    Social History Social History   Tobacco Use   Smoking status: Former    Types: Cigarettes    Quit date: 04/05/2014    Years since quitting: 8.5   Smokeless tobacco: Never  Vaping Use   Vaping Use: Never used  Substance Use Topics   Alcohol use: Not Currently   Drug use: Never     Allergies   Penicillins   Review of Systems Review of Systems  Respiratory:  Positive for cough.   Gastrointestinal:  Positive for  abdominal pain.  Musculoskeletal:  Positive for back pain.  As listed above in HPI   Physical Exam Triage Vital Signs ED Triage Vitals  Enc Vitals Group     BP 10/15/22 1028 (!) 153/86     Pulse Rate 10/15/22 1028 75     Resp 10/15/22 1028 16     Temp 10/15/22 1028 98.1 F (36.7 C)     Temp Source 10/15/22 1028 Oral     SpO2 10/15/22 1028 95 %     Weight --      Height --      Head Circumference --      Peak Flow --      Pain Score 10/15/22 1029 8     Pain Loc --      Pain Edu? --      Excl. in [place]? --    No data found.  Updated Vital Signs BP (!) 153/86 (BP Location: Left Arm)   Pulse 75   Temp 98.1 F (36.7 C) (Oral)   Resp 16   LMP 09/07/2018   SpO2 95%   Physical Exam   UC  Treatments / Results  Labs (all labs ordered are listed, but only abnormal results are displayed) Labs Reviewed  CBC WITH DIFFERENTIAL/PLATELET  LIPASE, BLOOD  POCT URINALYSIS DIPSTICK, ED / UC    EKG   Radiology No results found.  Procedures Procedures (including critical care time)  Medications Ordered in UC Medications  ondansetron (ZOFRAN) tablet 4 mg (has no administration in time range)    Initial Impression / Assessment and Plan / UC Course  I have reviewed the triage vital signs and the nursing notes.  Pertinent labs & imaging results that were available during my care of the patient were reviewed by me and considered in my medical decision making (see chart for details).     Nausea and right upper quadrant pain Patient had upper quadrant ultrasound last year, she may benefit from repeat scan or gastroenterology workup. She received Zofran here in the urgent care for her nausea.  I also sent a prescription of Zofran to her pharmacy.  I recommend some MiraLAX to help with her constipation.  I recommend she stay adequately hydrated, return to the ER or urgent care if her symptoms worsen or fail to improve.  I also stressed to her the importance of follow-up with her primary care provider. Final Clinical Impressions(s) / UC Diagnoses   Final diagnoses:  Nausea without vomiting  Constipation, unspecified constipation type  RUQ abdominal pain   Discharge Instructions   None    ED Prescriptions   None    PDMP not reviewed this encounter.   Elmore Guise, DO 10/15/22 1415

## 2022-10-15 NOTE — ED Triage Notes (Signed)
Per InterpreterEvette Doffing [phone] Patient c/o generalized body pain and states "it feels like someone is pinching all over.", upper back and shoulder pain x 1 weeks.  Patient also reports that she has had a non productive cough x 3 days.  Patient states she took medication for "stomach pain, but does not remember the name.

## 2022-10-15 NOTE — Discharge Instructions (Signed)
I have sent to your pharmacy prescription for some nausea medication.  I also recommend she pick up some MiraLAX over-the-counter to help with your constipation.  Remember to drink adequate amounts of water to help keep you hydrated.  I recommend follow-up with your primary care doctor so that she may perform further testing.  She will also be able to review your lab work at that time.  If there is something abnormal or urgent care staff will call you with results.  If your symptoms worsen or fail to improve I recommend you be evaluated at the ER  Mboherereje imiti ya farumasi kugirango imiti isesemi. Ndamusaba kandi ko yatora MiraLAX kuri konte kugirango agufashe kuribwa mu nda. [employer] amazi ahagije kugirango agufashe [employer]. Ndasaba gukurikiranwa na muganga wawe wibanze kugirango ashobore gukora ibindi bizamini. Azashobora kandi gusuzuma imirimo ya laboratoire muri kiriya gihe. Niba hari ikintu kidasanzwe cyangwa abakozi bashinzwe ubuvuzi bwihutirwa bazaguhamagara nibisubizo. Niba ibimenyetso byawe bikabije cyangwa binaniwe gutera imbere ndagusaba ko wasuzumwa mubyihutirwa

## 2022-11-12 ENCOUNTER — Ambulatory Visit (INDEPENDENT_AMBULATORY_CARE_PROVIDER_SITE_OTHER): Payer: 59 | Admitting: Family Medicine

## 2022-11-12 VITALS — BP 124/78 | HR 80 | Ht 65.0 in | Wt 187.0 lb

## 2022-11-12 DIAGNOSIS — R300 Dysuria: Secondary | ICD-10-CM

## 2022-11-12 DIAGNOSIS — R1013 Epigastric pain: Secondary | ICD-10-CM | POA: Diagnosis not present

## 2022-11-12 LAB — POCT UA - MICROSCOPIC ONLY: WBC, Ur, HPF, POC: >20 (ref 0–5)

## 2022-11-12 LAB — POCT URINALYSIS DIP (MANUAL ENTRY)
Bilirubin, UA: NEGATIVE
Glucose, UA: 500 mg/dL — AB
Ketones, POC UA: NEGATIVE mg/dL
Nitrite, UA: NEGATIVE
Protein Ur, POC: 100 mg/dL — AB
Spec Grav, UA: 1.025 (ref 1.010–1.025)
Urobilinogen, UA: 0.2 E.U./dL
pH, UA: 6 (ref 5.0–8.0)

## 2022-11-12 MED ORDER — PANTOPRAZOLE SODIUM 40 MG PO TBEC: 40.0000 mg | DELAYED_RELEASE_TABLET | Freq: Every day | ORAL | 0 refills | Status: DC

## 2022-11-12 NOTE — Progress Notes (Signed)
    SUBJECTIVE:   CHIEF COMPLAINT / HPI:   Patient presents for stomach pain. IPAD interpreter used for entirety of visit  Endorses intermittent stomach pain that wraps around to her back. Has a history of this pain for over a year. States pain comes any time and not associated with food intake.  Also endorses painful urination that started one week ago. Denies nausea, vomiting, fever, chills. States her constipation has improved since she's been drinking more water. Denies blood in stool. Also with cough at night and feels like food gets stuck in her throat. Unsure of names of her medications but states she is taking one for acid reflux.   Was seen in urgent care in march for this and the ED several months prior   RUQ [place] one year ago negative   PERTINENT  PMH / PSH: Reviewed   OBJECTIVE:   BP 124/78   Pulse 80   Ht 5\' 5"  (1.651 m)   Wt 187 lb (84.8 kg)   LMP 09/07/2018   SpO2 98%   BMI 31.12 kg/m    Physical exam General: well appearing, NAD Cardiovascular: RRR, no murmurs Lungs: CTAB. Normal WOB Abdomen: soft, non-distended, mildly tender to palpation in epigastric area and RUQ without guarding or rebound tenderness Skin: warm, dry. No edema  ASSESSMENT/PLAN:   Epigastric abdominal pain Patient presents with chronic intermittent epigastric pain. No signs of acute abdomen on exam. No red flag symptoms (blood in stool, weight loss, etc). Workup for this pain thus far has been unremarkable. Likely GERD vs functional dyspepsia. Currently taking Protonix 20mg , will increase to 40mg . Will continue to monitor as needed.    Dysuria UA with small leuks, proteinuria and glucosuria with >20 WBC. Urine culture ordered. Not on meds that would cause glucosuria. Recommended checking A1c at follow up.     Cora Collum, DO [facility]

## 2022-11-12 NOTE — Patient Instructions (Addendum)
It was great seeing you today!  Im sorry your stomach has been bothering you. Today we increased your stomach acid medication and you will take  (you can take 2 of what you already have and then get the new prescription)  We also checked your urine for infection and I will call you if it is abnormal.   Feel free to call with any questions or concerns at any time, at [phone].   Take care,  Dr. Cora Collum [facility]

## 2022-11-13 DIAGNOSIS — R300 Dysuria: Secondary | ICD-10-CM | POA: Insufficient documentation

## 2022-11-13 NOTE — Assessment & Plan Note (Addendum)
UA with small leuks, proteinuria and glucosuria with >20 WBC. Urine culture ordered. Not on meds that would cause glucosuria. Recommended checking A1c at follow up.

## 2022-11-13 NOTE — Assessment & Plan Note (Signed)
Patient presents with chronic intermittent epigastric pain. No signs of acute abdomen on exam. No red flag symptoms (blood in stool, weight loss, etc). Workup for this pain thus far has been unremarkable. Likely GERD vs functional dyspepsia. Currently taking Protonix 20mg , will increase to 40mg . Will continue to monitor as needed.

## 2022-11-13 NOTE — Assessment & Plan Note (Signed)
>>  ASSESSMENT AND PLAN FOR EPIGASTRIC ABDOMINAL PAIN WRITTEN ON 11/13/2022  9:13 AM BY PAIGE, VICTORIA J, DO  Patient presents with chronic intermittent epigastric pain. No signs of acute abdomen on exam. No red flag symptoms (blood in stool, weight loss, etc). Workup for this pain thus far has been unremarkable. Likely GERD vs functional dyspepsia. Currently taking Protonix 20mg , will increase to 40mg . Will continue to monitor as needed.

## 2022-11-15 DIAGNOSIS — Z20822 Contact with and (suspected) exposure to covid-19: Secondary | ICD-10-CM | POA: Insufficient documentation

## 2022-11-15 DIAGNOSIS — M2550 Pain in unspecified joint: Secondary | ICD-10-CM | POA: Insufficient documentation

## 2022-11-15 DIAGNOSIS — R509 Fever, unspecified: Secondary | ICD-10-CM | POA: Diagnosis not present

## 2022-11-16 ENCOUNTER — Encounter (HOSPITAL_COMMUNITY): Payer: Self-pay

## 2022-11-16 ENCOUNTER — Emergency Department (HOSPITAL_COMMUNITY)
Admission: EM | Admit: 2022-11-16 | Discharge: 2022-11-16 | Disposition: A | Discharge status: Home / Self Care | Payer: 59 | Attending: Emergency Medicine | Admitting: Emergency Medicine

## 2022-11-16 DIAGNOSIS — M2559 Pain in other specified joint: Secondary | ICD-10-CM

## 2022-11-16 LAB — BASIC METABOLIC PANEL
Anion gap: 8 (ref 5–15)
BUN: 11 mg/dL (ref 6–20)
CO2: 24 mmol/L (ref 22–32)
Calcium: 9 mg/dL (ref 8.9–10.3)
Chloride: 100 mmol/L (ref 98–111)
Creatinine, Ser: 0.87 mg/dL (ref 0.44–1.00)
GFR, Estimated: >60 mL/min (ref >60)
Glucose, Bld: 171 mg/dL — ABNORMAL HIGH (ref 70–99)
Potassium: 3.8 mmol/L (ref 3.5–5.1)
Sodium: 132 mmol/L — ABNORMAL LOW (ref 135–145)

## 2022-11-16 LAB — CBC
HCT: 38.8 % (ref 36.0–46.0)
Hemoglobin: 12.7 g/dL (ref 12.0–15.0)
MCH: 27.3 pg (ref 26.0–34.0)
MCHC: 32.7 g/dL (ref 30.0–36.0)
MCV: 83.4 fL (ref 80.0–100.0)
Platelets: 284 10*3/uL (ref 150–400)
RBC: 4.65 MIL/uL (ref 3.87–5.11)
RDW: 12.3 % (ref 11.5–15.5)
WBC: 10.5 10*3/uL (ref 4.0–10.5)
nRBC: 0 % (ref 0.0–0.2)

## 2022-11-16 LAB — RESP PANEL BY RT-PCR (RSV, FLU A&B, COVID)  RVPGX2
Influenza A by PCR: NEGATIVE
Influenza B by PCR: NEGATIVE
Resp Syncytial Virus by PCR: NEGATIVE
SARS Coronavirus 2 by RT PCR: NEGATIVE

## 2022-11-16 MED ORDER — KETOROLAC TROMETHAMINE 60 MG/2ML IM SOLN
30.0000 mg | Freq: Once | INTRAMUSCULAR | Status: AC
  Administered 2022-11-16: 30 mg via INTRAMUSCULAR
  Filled 2022-11-16: qty 2

## 2022-11-16 MED ORDER — NAPROXEN 500 MG PO TABS: 500.0000 mg | ORAL_TABLET | Freq: Two times a day (BID) | ORAL | 0 refills | Status: DC

## 2022-11-16 NOTE — ED Provider Triage Note (Signed)
  Emergency Medicine Provider Triage Evaluation Note  MRN:  [id number]  Arrival date & time: 11/16/22    Medically screening exam initiated at 1:43 AM.   CC:   Joint Pain   HPI:  Deborah Sampson is a 62 y.o. year-old female presents to the ED with chief complaint of body aches, headache, and joint pain. Onset of symptoms was yesterday.  Reports subjective fever.  Tried ibuprofen at home.  History provided by patient. ROS:  -As included in HPI PE:   Vitals:   11/16/22 0004  BP: 122/67  Pulse: 92  Resp: 18  Temp: 99.1 F (37.3 C)  SpO2: 97%    Non-toxic appearing No respiratory distress  MDM:   I've ordered labs in triage to expedite lab/diagnostic workup.  Patient was informed that the remainder of the evaluation will be completed by another provider, this initial triage assessment does not replace that evaluation, and the importance of remaining in the ED until their evaluation is complete.    Roxy Horseman, PA-C 11/16/22 [phone]

## 2022-11-16 NOTE — ED Triage Notes (Signed)
Pt states that she is feeling generalized joint pain, back pain and fever. Pt endorses coughing, denies SOB, N/V, chest pain. Pt is afebrile.

## 2022-11-16 NOTE — ED Notes (Signed)
Patient presents ambulatory to room c/o several day hx of cough and body aches and subjective fever. Pt a/o x 4 respirations even and non labored . Cough is non productive.

## 2022-11-16 NOTE — ED Provider Notes (Signed)
[place] EMERGENCY DEPARTMENT AT [facility] Provider Note   CSN: [id number] Arrival date & time: 11/15/22  2336     History  Chief Complaint  Patient presents with   Joint Pain    Deborah Sampson is a 62 y.o. female.  The history is provided by the patient. The history is limited by a language barrier. A language interpreter was used.  Illness Location:  Back other joints and muscles aches and subjective fever Quality:  Painful Severity:  Moderate Onset quality:  Gradual Duration:  2 days Timing:  Constant Progression:  Unchanged Chronicity:  New Relieved by:  Nothing Worsened by:  Nothing Ineffective treatments:  None Associated symptoms: myalgias   Associated symptoms: no chest pain, no congestion, no cough, no diarrhea, no fever and no rhinorrhea   Patient with back pain presents with myalgias and arthralgias and subjective fever.  No cough, no urinary symptom.      Past Medical History:  Diagnosis Date   Acute appendicitis 11/21/2018   Back pain 11/27/2015   GERD (gastroesophageal reflux disease)    H. pylori infection 07/25/2019   Headache 12/15/2016   Palpitations 11/27/2015   Patient speaks only [place] 07/25/2019   Refugee health examination 11/27/2015   S/P laparoscopic appendectomy 11/21/2018   Scars, cultural scarification 11/27/2015     Home Medications Prior to Admission medications   Medication Sig Start Date End Date Taking? Authorizing Provider  Biotin 3 MG TABS Take 1 tablet (3 mg total) by mouth daily. 08/17/22   Merrilee Jansky, MD  ibuprofen (ADVIL) 600 MG tablet Take 1 tablet (600 mg total) by mouth every 6 (six) hours as needed. 08/17/22   Merrilee Jansky, MD  pantoprazole (PROTONIX) 40 MG tablet Take 1 tablet (40 mg total) by mouth daily. 11/12/22   Cora Collum, DO      Allergies    Penicillins    Review of Systems   Review of Systems  Constitutional:  Negative for fever.  HENT:  Negative for congestion and  rhinorrhea.   Respiratory:  Negative for cough.   Cardiovascular:  Negative for chest pain.  Gastrointestinal:  Negative for diarrhea.  Musculoskeletal:  Positive for arthralgias and myalgias.  All other systems reviewed and are negative.   Physical Exam Updated Vital Signs BP 126/67 (BP Location: Left Arm)   Pulse 85   Temp 98.7 F (37.1 C) (Oral)   Resp 15   LMP 09/07/2018   SpO2 98%  Physical Exam Vitals and nursing note reviewed.  Constitutional:      General: She is not in acute distress.    Appearance: Normal appearance. She is well-developed.  HENT:     Head: Normocephalic and atraumatic.     Nose: Nose normal.     Mouth/Throat:     Mouth: Mucous membranes are moist.  Eyes:     Pupils: Pupils are equal, round, and reactive to light.  Cardiovascular:     Rate and Rhythm: Normal rate and regular rhythm.     Pulses: Normal pulses.     Heart sounds: Normal heart sounds.  Pulmonary:     Effort: Pulmonary effort is normal. No respiratory distress.     Breath sounds: Normal breath sounds.  Abdominal:     General: Bowel sounds are normal. There is no distension.     Palpations: Abdomen is soft.     Tenderness: There is no abdominal tenderness. There is no guarding or rebound.  Genitourinary:  Vagina: No vaginal discharge.  Musculoskeletal:        General: No tenderness. Normal range of motion.     Cervical back: Neck supple.     Right lower leg: No edema.     Left lower leg: No edema.  Skin:    General: Skin is warm and dry.     Capillary Refill: Capillary refill takes less than 2 seconds.     Findings: No erythema or rash.  Neurological:     General: No focal deficit present.     Mental Status: She is alert and oriented to person, place, and time.     Deep Tendon Reflexes: Reflexes normal.  Psychiatric:        Mood and Affect: Mood normal.        Behavior: Behavior normal.     ED Results / Procedures / Treatments   Labs (all labs ordered are listed,  but only abnormal results are displayed) Results for orders placed or performed during the hospital encounter of 11/16/22  Resp panel by RT-PCR (RSV, Flu A&B, Covid) Anterior Nasal Swab   Specimen: Anterior Nasal Swab  Result Value Ref Range   SARS Coronavirus 2 by RT PCR NEGATIVE NEGATIVE   Influenza A by PCR NEGATIVE NEGATIVE   Influenza B by PCR NEGATIVE NEGATIVE   Resp Syncytial Virus by PCR NEGATIVE NEGATIVE  CBC  Result Value Ref Range   WBC 10.5 4.0 - 10.5 K/uL   RBC 4.65 3.87 - 5.11 MIL/uL   Hemoglobin 12.7 12.0 - 15.0 g/dL   HCT 16.1 09.6 - 04.5 %   MCV 83.4 80.0 - 100.0 fL   MCH 27.3 26.0 - 34.0 pg   MCHC 32.7 30.0 - 36.0 g/dL   RDW 40.9 81.1 - 91.4 %   Platelets 284 150 - 400 K/uL   nRBC 0.0 0.0 - 0.2 %  Basic metabolic panel  Result Value Ref Range   Sodium 132 (L) 135 - 145 mmol/L   Potassium 3.8 3.5 - 5.1 mmol/L   Chloride 100 98 - 111 mmol/L   CO2 24 22 - 32 mmol/L   Glucose, Bld 171 (H) 70 - 99 mg/dL   BUN 11 6 - 20 mg/dL   Creatinine, Ser 7.82 0.44 - 1.00 mg/dL   Calcium 9.0 8.9 - 95.6 mg/dL   GFR, Estimated >21 >30 mL/min   Anion gap 8 5 - 15   No results found.   Radiology No results found.  Procedures Procedures    Medications Ordered in ED Medications  ketorolac (TORADOL) injection 30 mg (has no administration in time range)    ED Course/ Medical Decision Making/ A&P                             Medical Decision Making Joint pain and muscle aches and subjective fever  Amount and/or Complexity of Data Reviewed External Data Reviewed: notes.    Details: Previous notes reviewed  Labs: ordered.    Details: Negative covid and flu, 10.5 normal white count, normal hemoglobin 12.7 normal platelets. Sodium 132, normal potassium 3.8, normal creatinine.87   Risk Prescription drug management.    Final Clinical Impression(s) / ED Diagnoses Final diagnoses:  None   Return for intractable cough, coughing up blood, fevers > 100.4 unrelieved  by medication, shortness of breath, intractable vomiting, chest pain, shortness of breath, weakness, numbness, changes in speech, facial asymmetry, abdominal pain, passing out, Inability to tolerate liquids  or food, cough, altered mental status or any concerns. No signs of systemic illness or infection. The patient is nontoxic-appearing on exam and vital signs are within normal limits.  I have reviewed the triage vital signs and the nursing notes. Pertinent labs & imaging results that were available during my care of the patient were reviewed by me and considered in my medical decision making (see chart for details). After history, exam, and medical workup I feel the patient has been appropriately medically screened and is safe for discharge home. Pertinent diagnoses were discussed with the patient. Patient was given return precautions.     Rx / DC Orders ED Discharge Orders     None         Nicci Vaughan, MD 11/16/22 [phone]

## 2022-11-18 ENCOUNTER — Ambulatory Visit (HOSPITAL_COMMUNITY)
Admission: EM | Admit: 2022-11-18 | Discharge: 2022-11-18 | Disposition: A | Discharge status: Home / Self Care | Payer: 59 | Attending: Family Medicine | Admitting: Family Medicine

## 2022-11-18 ENCOUNTER — Other Ambulatory Visit: Payer: Self-pay

## 2022-11-18 ENCOUNTER — Encounter (HOSPITAL_COMMUNITY): Payer: Self-pay | Admitting: Emergency Medicine

## 2022-11-18 DIAGNOSIS — N390 Urinary tract infection, site not specified: Secondary | ICD-10-CM | POA: Insufficient documentation

## 2022-11-18 DIAGNOSIS — R1030 Lower abdominal pain, unspecified: Secondary | ICD-10-CM | POA: Insufficient documentation

## 2022-11-18 DIAGNOSIS — R109 Unspecified abdominal pain: Secondary | ICD-10-CM | POA: Insufficient documentation

## 2022-11-18 LAB — URINE CULTURE

## 2022-11-18 LAB — POCT URINALYSIS DIP (MANUAL ENTRY)
Bilirubin, UA: NEGATIVE
Glucose, UA: NEGATIVE mg/dL
Ketones, POC UA: NEGATIVE mg/dL
Nitrite, UA: NEGATIVE
Protein Ur, POC: NEGATIVE mg/dL
Spec Grav, UA: 1.01 (ref 1.010–1.025)
Urobilinogen, UA: 0.2 E.U./dL
pH, UA: 6 (ref 5.0–8.0)

## 2022-11-18 MED ORDER — NITROFURANTOIN MONOHYD MACRO 100 MG PO CAPS: 100.0000 mg | ORAL_CAPSULE | Freq: Two times a day (BID) | ORAL | 0 refills | Status: DC

## 2022-11-18 NOTE — ED Provider Notes (Signed)
[facility]    CSN: [id number] Arrival date & time: 11/18/22  1040      History   Chief Complaint Chief Complaint  Patient presents with   Abdominal Pain    HPI Deborah Sampson is a 62 y.o. female.   HPI Patient presents today with complaints of worsening abdominal pain and general body aches.  Patient has been seen multiple times here and at the ER for symptoms associated with GERD and peptic ulcer disease.  Patient was seen at the ED on 11/12/2022 and started on Protonix and prior to that visit on the same day patient was seen at her primary care office at [facility] urinalysis was collected and urine culture ordered which resulted positive for 50-100000 colonies of Klebsiella consistent with that of a UTI.  Patient reports that she was not started on any antibiotics and feels that someone could have called her but due to language barrier she may have not understood the message.  Patient denies any vomiting but endorses nausea and generally feeling unwell.  On arrival patient is afebrile.  She does endorse flank pain which is worse on the right flank compared to the left.  Patient request a work note to allow time for symptoms to improve. Past Medical History:  Diagnosis Date   Acute appendicitis 11/21/2018   Back pain 11/27/2015   GERD (gastroesophageal reflux disease)    H. pylori infection 07/25/2019   Headache 12/15/2016   Palpitations 11/27/2015   Patient speaks only [place] 07/25/2019   Refugee health examination 11/27/2015   S/P laparoscopic appendectomy 11/21/2018   Scars, cultural scarification 11/27/2015    Patient Active Problem List   Diagnosis Date Noted   Dysuria 11/13/2022   Epigastric abdominal pain 09/24/2021   PUD (peptic ulcer disease) 08/10/2019   Postmenopausal bleeding 07/25/2019     07/25/2019   Fibroids 07/25/2019   Gastroesophageal reflux disease 03/08/2019   Chronic constipation 01/24/2016    Past Surgical History:  Procedure  Laterality Date   APPENDECTOMY     LAPAROSCOPIC APPENDECTOMY N/A 11/21/2018   Procedure: APPENDECTOMY LAPAROSCOPIC;  Surgeon: Emelia Loron, MD;  Location: [facility] OR;  Service: General;  Laterality: N/A;    OB History   No obstetric history on file.      Home Medications    Prior to Admission medications   Medication Sig Start Date End Date Taking? Authorizing Provider  nitrofurantoin, macrocrystal-monohydrate, (MACROBID) 100 MG capsule Take 1 capsule (100 mg total) by mouth 2 (two) times daily. 11/18/22  Yes Bing Neighbors, NP  Biotin 3 MG TABS Take 1 tablet (3 mg total) by mouth daily. Patient not taking: Reported on 11/18/2022 08/17/22   Merrilee Jansky, MD  ibuprofen (ADVIL) 600 MG tablet Take 1 tablet (600 mg total) by mouth every 6 (six) hours as needed. Patient not taking: Reported on 11/18/2022 08/17/22   Merrilee Jansky, MD  naproxen (NAPROSYN) 500 MG tablet Take 1 tablet (500 mg total) by mouth 2 (two) times daily. Patient not taking: Reported on 11/18/2022 11/16/22   Palumbo, April, MD  pantoprazole (PROTONIX) 40 MG tablet Take 1 tablet (40 mg total) by mouth daily. 11/12/22   Cora Collum, DO    Family History Family History  Family history unknown: Yes    Social History Social History   Tobacco Use   Smoking status: Former    Types: Cigarettes    Quit date: 04/05/2014    Years since quitting: 8.6   Smokeless tobacco:  Never  Vaping Use   Vaping Use: Never used  Substance Use Topics   Alcohol use: Not Currently   Drug use: Never     Allergies   Penicillins   Review of Systems Review of Systems Pertinent negatives listed in HPI   Physical Exam Triage Vital Signs ED Triage Vitals  Enc Vitals Group     BP      Pulse      Resp      Temp      Temp src      SpO2      Weight      Height      Head Circumference      Peak Flow      Pain Score      Pain Loc      Pain Edu?      Excl. in GC?    No data found.  Updated Vital Signs BP 128/72  (BP Location: Right Arm)   Pulse 77   Temp 98.1 F (36.7 C) (Oral)   Resp 20   LMP 09/07/2018   SpO2 96%   Visual Acuity Right Eye Distance:   Left Eye Distance:   Bilateral Distance:    Right Eye Near:   Left Eye Near:    Bilateral Near:     Physical Exam General appearance: Alert, non toxic appearing,  cooperative and in no distress Head: Normocephalic, without obvious abnormality, atraumatic Respiratory: Respirations even and unlabored, normal respiratory rate Heart: Rate and rhythm normal.   Abdomen: BS x 4 active, bilateral lower abdominal tenderness with deep palpation, no palpable mass CVA:  Right CVA tenderness positive and Left CVA tenderness  Extremities: No gross deformities Skin: Skin color, texture, turgor normal. No rashes seen  Psych: Appropriate mood and affect.   UC Treatments / Results  Labs (all labs ordered are listed, but only abnormal results are displayed) Labs Reviewed  URINE CULTURE  POCT URINALYSIS DIP (MANUAL ENTRY)    EKG   Radiology No results found.  Procedures Procedures (including critical care time)  Medications Ordered in UC Medications - No data to display  Initial Impression / Assessment and Plan / UC Course  I have reviewed the triage vital signs and the nursing notes.  Pertinent labs & imaging results that were available during my care of the patient were reviewed by me and considered in my medical decision making (see chart for details).    Based on exam findings and recent positive urine culture treating empirically for acute UTI with nitrofurantoin.  Collected updated urine and ordering a new urine culture as patient symptoms appear to have worsened to ensure no changes in urine bacteria that would require additional or changes then antibiotic treatment.  Patient advised to continue taking the Protonix and to ensure that she eats with taking the antibiotic given history of peptic ulcer disease and GERD.  Work note  provided.  Strict return precautions given if symptoms worsen or do not improve. Final Clinical Impressions(s) / UC Diagnoses   Final diagnoses:  Acute UTI (urinary tract infection)  Flank pain  Lower abdominal pain     Discharge Instructions       I am treating you for a urinary tract infection based on review of your lab results at your last primary care visit 5 days ago, you have a urinary tract infection which has not been treated and this is likely causing your abdominal pain Start nitrofurantoin 100 mg twice daily for total  of 10 days and take with food.  Drink plenty of water continue taking the Protonix which was previously prescribed for your GERD as the antibiotics can cause upset stomach. I have also included a work note you may return to work on Monday, 11/23/2022.     ED Prescriptions     Medication Sig Dispense Auth. Provider   nitrofurantoin, macrocrystal-monohydrate, (MACROBID) 100 MG capsule Take 1 capsule (100 mg total) by mouth 2 (two) times daily. 20 capsule Bing Neighbors, NP      PDMP not reviewed this encounter.   Bing Neighbors, NP 11/18/22 1208

## 2022-11-18 NOTE — ED Triage Notes (Signed)
Patient has epigastric pain, headache and extremity pain.  No vomiting.  Patient admits to nausea, patient denies diarrhea.    Patient has cough.    Denies runny nose, states does not feel like she is getting a fever.    Was taken medicine to reduce acid in stomach,but it did not help.    Patient went to ED 11/16/2022.

## 2022-11-18 NOTE — Discharge Instructions (Addendum)
I am treating you for a urinary tract infection based on review of your lab results at your last primary care visit 5 days ago, you have a urinary tract infection which has not been treated and this is likely causing your abdominal pain Start nitrofurantoin 100 mg twice daily for total of 10 days and take with food.  Drink plenty of water continue taking the Protonix which was previously prescribed for your GERD as the antibiotics can cause upset stomach. I have also included a work note you may return to work on Monday, 11/23/2022.

## 2022-11-19 LAB — URINE CULTURE
Culture: <10000 — AB
Special Requests: NORMAL

## 2022-11-24 ENCOUNTER — Encounter: Payer: Self-pay | Admitting: Family Medicine

## 2022-11-24 ENCOUNTER — Ambulatory Visit (INDEPENDENT_AMBULATORY_CARE_PROVIDER_SITE_OTHER): Payer: 59 | Admitting: Family Medicine

## 2022-11-24 VITALS — BP 110/60 | HR 87 | Temp 98.8°F | Ht 65.0 in | Wt 185.0 lb

## 2022-11-24 DIAGNOSIS — N898 Other specified noninflammatory disorders of vagina: Secondary | ICD-10-CM | POA: Diagnosis not present

## 2022-11-24 DIAGNOSIS — R1013 Epigastric pain: Secondary | ICD-10-CM | POA: Diagnosis not present

## 2022-11-24 MED ORDER — FLUCONAZOLE 150 MG PO TABS: 150.0000 mg | ORAL_TABLET | Freq: Once | ORAL | 0 refills | Status: AC

## 2022-11-24 MED ORDER — FAMOTIDINE 20 MG PO TABS: 20.0000 mg | ORAL_TABLET | Freq: Two times a day (BID) | ORAL | 0 refills | Status: DC

## 2022-11-24 NOTE — Patient Instructions (Addendum)
I am adding a medication called famotidine/Pepcid to take daily  I am sending in a medication for your vaginal itching that you will take once you are finished with your antibiotics.

## 2022-11-24 NOTE — Progress Notes (Signed)
    SUBJECTIVE:   CHIEF COMPLAINT / HPI:   Abdominal Pain - Was not able to take medications yesterday at work due to time but has been taking them as prescribed otherwise - Was seen in ER for UTI on 5/1 and currently finishing Macrobid (which was intermediate sensitivity per culture but patient's symptoms have improved) - Has nausea without vomiting, which is chronic - Reports that the protonix helps her symptoms but it wears off by 2-3pm - Is needing note for work   Vaginal itching - Has been present for a few days - Not having any vaginal discharge  - No rash or pain per the patient  - Wants to defer exam   PERTINENT  PMH / PSH: Reviewed  OBJECTIVE:   BP 110/60   Pulse 87   Temp 98.8 F (37.1 C) (Oral)   Ht 5\' 5"  (1.651 m)   Wt 185 lb (83.9 kg)   LMP 09/07/2018   SpO2 98%   BMI 30.79 kg/m   Gen: well-appearing, NAD CV: RRR, no m/r/g appreciated, no peripheral edema Pulm: CTAB, no wheezes/crackles GI: soft, TTP in the epigastric region non-distended, no rebound tenderness   ASSESSMENT/PLAN:   Epigastric abdominal pain Persistent epigastric pain with no red flag symptoms. Examination with mild tenderness and no peritoneal signs. History of H. Pylori and prior EGD. Currently on PPI, so unable to retest for H.pylori at this visit. Reports improvement with Protonix but has symptom reappearance in the afternoons.  - continue protonix 40mg  daily - start pepcid 20mg  1-2 times daily  - return precautions discussed - will message refugee clinic team for follow-up in 4 weeks   Vaginal itching Patient deferred pelvic examination. Itching without discharge or other concerning symptoms. Reports symptom in the setting of antibiotic use for UTI treatment. Based on description and recent antibiotic use, assume likely yeast infection and will send treatment.  - Diflucan 150mg  x1 dose after antibiotics finished  Deborah Rochon, DO [facility]

## 2022-11-24 NOTE — Assessment & Plan Note (Signed)
>>  ASSESSMENT AND PLAN FOR EPIGASTRIC ABDOMINAL PAIN WRITTEN ON 11/24/2022 10:47 AM BY LILLAND, ALANA, DO  Persistent epigastric pain with no red flag symptoms. Examination with mild tenderness and no peritoneal signs. History of H. Pylori and prior EGD. Currently on PPI, so unable to retest for H.pylori at this visit. Reports improvement with Protonix but has symptom reappearance in the afternoons.  - continue protonix 40mg  daily - start pepcid 20mg  1-2 times daily  - return precautions discussed - will message refugee clinic team for follow-up in 4 weeks

## 2022-11-24 NOTE — Assessment & Plan Note (Signed)
Persistent epigastric pain with no red flag symptoms. Examination with mild tenderness and no peritoneal signs. History of H. Pylori and prior EGD. Currently on PPI, so unable to retest for H.pylori at this visit. Reports improvement with Protonix but has symptom reappearance in the afternoons.  - continue protonix 40mg  daily - start pepcid 20mg  1-2 times daily  - return precautions discussed - will message refugee clinic team for follow-up in 4 weeks

## 2022-12-15 ENCOUNTER — Ambulatory Visit: Payer: 59 | Admitting: Family Medicine

## 2022-12-16 ENCOUNTER — Ambulatory Visit (INDEPENDENT_AMBULATORY_CARE_PROVIDER_SITE_OTHER): Payer: 59 | Admitting: Family Medicine

## 2022-12-16 VITALS — BP 150/76 | HR 70 | Wt 184.0 lb

## 2022-12-16 DIAGNOSIS — R030 Elevated blood-pressure reading, without diagnosis of hypertension: Secondary | ICD-10-CM

## 2022-12-16 DIAGNOSIS — K219 Gastro-esophageal reflux disease without esophagitis: Secondary | ICD-10-CM

## 2022-12-16 MED ORDER — DEXLANSOPRAZOLE 60 MG PO CPDR: 60.0000 mg | DELAYED_RELEASE_CAPSULE | Freq: Every day | ORAL | 0 refills | Status: DC

## 2022-12-16 NOTE — Progress Notes (Signed)
    SUBJECTIVE:   CHIEF COMPLAINT / HPI:   Abdominal Pain f/u Patient reports epigastric abdominal pain.  This is a longstanding problem for many years but seems to wax and wane.  Currently having symptoms about half of the days.  She sometimes has nausea, but no vomiting.  In general has bowel movement every other day.  History of chronic constipation, for which she uses MiraLAX only as needed (unable to tell me how often she needs it).  No blood in her stool.  No weight loss.  No dysphagia.  Reports that her symptoms seem to be worse after eating rice. Of note, she is not taking her Protonix or Pepcid.  She does have a history of H pylori in 2020 which was treated and she subsequently tested negative in 2021 on EGD biopsies. Saw GI, last visit 10/17/2020  PERTINENT  PMH / PSH: Per HPI  OBJECTIVE:   BP (!) 150/76   Pulse 70   Wt 184 lb (83.5 kg)   LMP 09/07/2018   SpO2 97%   BMI 30.62 kg/m   Gen: NAD, pleasant, able to participate in exam CV: RRR, normal S1/S2, no murmur Resp: Normal effort, lungs CTAB GI: normoactive bowel sounds, mild epigastric tenderness, abdomen otherwise nontender Extremities: no edema or cyanosis Skin: warm and dry, no rashes noted Neuro: alert, no obvious focal deficits Psych: Normal affect and mood   ASSESSMENT/PLAN:   Gastroesophageal reflux disease Her longstanding epigastric pain is consistent with GERD.  Currently flaring as she is not taking any medication.  No red flags and previously had unremarkable GI workup.  Has trialed multiple PPIs in the past and Dexilant has worked best. D/c Protonix and pepcid which she is not using anyway.  Resume Dexilant 60mg  daily.  Printed prescription provided today.  Elevated blood pressure reading Blood pressure elevated x2 today.  No prior diagnosis of hypertension.  Follow-up with PCP in 1 month.   Kinyarwanda interpreter used for duration of encounter.  Maury Dus, MD [facility]

## 2022-12-16 NOTE — Assessment & Plan Note (Addendum)
Her longstanding epigastric pain is consistent with GERD.  Currently flaring as she is not taking any medication.  No red flags and previously had unremarkable GI workup.  Has trialed multiple PPIs in the past and Dexilant has worked best. D/c Protonix and pepcid which she is not using anyway.  Resume Dexilant 60mg  daily.  Printed prescription provided today.

## 2022-12-16 NOTE — Assessment & Plan Note (Signed)
Blood pressure elevated x2 today.  No prior diagnosis of hypertension.  Follow-up with PCP in 1 month.

## 2022-12-22 ENCOUNTER — Other Ambulatory Visit: Payer: Self-pay | Admitting: Family Medicine

## 2023-01-08 ENCOUNTER — Ambulatory Visit: Payer: 59 | Admitting: Family Medicine

## 2023-01-08 ENCOUNTER — Encounter: Payer: Self-pay | Admitting: Family Medicine

## 2023-01-08 VITALS — BP 150/78 | HR 72 | Wt 184.0 lb

## 2023-01-08 DIAGNOSIS — I10 Essential (primary) hypertension: Secondary | ICD-10-CM | POA: Diagnosis not present

## 2023-01-08 DIAGNOSIS — K219 Gastro-esophageal reflux disease without esophagitis: Secondary | ICD-10-CM | POA: Diagnosis not present

## 2023-01-08 DIAGNOSIS — K5909 Other constipation: Secondary | ICD-10-CM

## 2023-01-08 MED ORDER — FAMOTIDINE 20 MG PO TABS: 20.0000 mg | ORAL_TABLET | Freq: Two times a day (BID) | ORAL | 1 refills | Status: DC

## 2023-01-08 MED ORDER — OMEPRAZOLE MAGNESIUM 20 MG PO TBEC: 20.0000 mg | DELAYED_RELEASE_TABLET | Freq: Two times a day (BID) | ORAL | 1 refills | Status: DC

## 2023-01-08 MED ORDER — AMLODIPINE BESYLATE 5 MG PO TABS: 5.0000 mg | ORAL_TABLET | Freq: Every day | ORAL | 3 refills | Status: DC

## 2023-01-08 NOTE — Progress Notes (Signed)
    SUBJECTIVE:   CHIEF COMPLAINT / HPI:   Kinyarwanda interpretor Anusia present and interpretation used for entirety of visit.  GERD- restarted on Dexilant 60mg  daily at last appointment, but was over $300 at the pharmacy so she did not pick it up. She does note that medications on the $4 list she would be able at afford. She continues to have a burning epigastric pain, no vomiting, that refluxes up her throat and she has a bad taste in her mouth. No triggers, not worse with certain foods, just comes on. Denies urinary symptoms, peeing normally, denies constipation, no blood in stools. She is not currently taking anything for it. Has tried medication in the past that helps but she cannot remember what it is. Last EGD in Oct 2021- biopsy negative for H pylori (previously treated) and patchy erythematous mucosa noted.  Elevated BP reading - x2 at last visit. Intermittently elevated looking at flowsheets for past visits. Sometimes has headaches but mostly when she is having heartburn.   Chronic Constipation- now taking Miralax daily, having daily soft BM. Denies blood in stool or diarrhea.  PERTINENT  PMH / PSH: GERD, h/o PUD, h/o H pylori s/p treatment, constipation, fibroids  OBJECTIVE:   BP (!) 150/78   Pulse 72   Wt 184 lb (83.5 kg)   LMP 09/07/2018   SpO2 100%   BMI 30.62 kg/m   General: A&O, NAD HEENT: No sign of trauma, EOM grossly intact Cardiac: RRR, no m/r/g Respiratory: CTAB, normal WOB, no w/c/r GI: Soft, TTP in epigastric area, without guarding or rebound, non-distended  Extremities: NTTP, no peripheral edema. Neuro: Normal gait, moves all four extremities appropriately. Psych: Appropriate mood and affect   ASSESSMENT/PLAN:   Gastroesophageal reflux disease Not on medication, previous EGD in 2021 showed successful treatment of H pylori BID Pepcid and Protonix, printed scripts given and using teach back method patient able to state how to take them F/u in 1 month,  no red flag signs or symptoms noted, weight reviewed and stable since last year  Essential hypertension Blood pressure remains elevated today and has been on past visits Pt agreeable to starting amlodipine 5mg  daily, printed script and using teach back can explain medication and how to take it  Chronic constipation Improved, consider Miralax     Billey Co, MD [facility]

## 2023-01-08 NOTE — Assessment & Plan Note (Signed)
Blood pressure remains elevated today and has been on past visits Pt agreeable to starting amlodipine 5mg  daily, printed script and using teach back can explain medication and how to take it

## 2023-01-08 NOTE — Assessment & Plan Note (Signed)
Improved, consider Miralax

## 2023-01-08 NOTE — Assessment & Plan Note (Addendum)
Not on medication, previous EGD in 2021 showed successful treatment of H pylori BID Pepcid and Protonix, printed scripts given and using teach back method patient able to state how to take them F/u in 1 month, no red flag signs or symptoms noted, weight reviewed and stable since last year

## 2023-01-08 NOTE — Patient Instructions (Addendum)
It was wonderful to see you today.  Nyamuneka uzane imiti yawe yose hamwe nawe igihe cyose usuye.   Uyu munsi twaganiriye:  Kumuvuduko wamaraso wawe- dutangiye amlodipine 5 mg KUMWE kumunsi   Kubitwikwa-   Turatangiye  Omeprazole (Prilosec) 20 mg KABIRI buri munsi  Famotidine (Pepcid) 20 mg KABIRI buri munsi   Please bring ALL of your medications with you to every visit.   Today we talked about:  For your blood pressure- we are starting amlodipine 5 mg ONCE daily   For your heartburn-   We are starting  Omeprazole (Prilosec) 20 mg TWICE daily  Famotidine (Pepcid) 20 mg TWICE daily  Thank you for choosing [facility].   Please call [phone] with any questions about today's appointment.  Please arrive at least 15 minutes prior to your scheduled appointments.   If you had blood work today, I will send you a MyChart message or a letter if results are normal. Otherwise, I will give you a call.   If you had a referral placed, they will call you to set up an appointment. Please give [place] a call if you don't hear back in the next 2 weeks.   If you need additional refills before your next appointment, please call your pharmacy first.   Burley Saver, MD  Family Medicine

## 2023-01-12 ENCOUNTER — Telehealth: Payer: Self-pay | Admitting: Family Medicine

## 2023-01-12 DIAGNOSIS — K219 Gastro-esophageal reflux disease without esophagitis: Secondary | ICD-10-CM

## 2023-01-12 DIAGNOSIS — I10 Essential (primary) hypertension: Secondary | ICD-10-CM

## 2023-01-12 NOTE — Telephone Encounter (Signed)
Patient came in stating that she went to the pharmacy to pick up her prescriptions after her appt on 01/08/23 and was told that that did not receive anything for her. Patient is asking if we can resend the prescriptions please and to let her know when they get sent in. Best contact number is [phone]

## 2023-01-13 MED ORDER — AMLODIPINE BESYLATE 5 MG PO TABS: 5.0000 mg | ORAL_TABLET | Freq: Every day | ORAL | 3 refills | Status: DC

## 2023-01-13 MED ORDER — OMEPRAZOLE MAGNESIUM 20 MG PO TBEC: 20.0000 mg | DELAYED_RELEASE_TABLET | Freq: Two times a day (BID) | ORAL | 1 refills | Status: DC

## 2023-01-13 MED ORDER — FAMOTIDINE 20 MG PO TABS: 20.0000 mg | ORAL_TABLET | Freq: Two times a day (BID) | ORAL | 1 refills | Status: DC

## 2023-01-13 NOTE — Telephone Encounter (Signed)
Pt walks in to check status.  Looks as if [employer] were printed.  With Dr. Berneta Levins permission sent electronically to pharmacy. Jone Baseman, CMA

## 2023-02-11 NOTE — Progress Notes (Signed)
    SUBJECTIVE:   CHIEF COMPLAINT / HPI:   HTN- started on amlodipine 5mg  daily at last visit. Only takes it some days as she feels tired and weak after she takes it. Does not feel tired on days she doesn't take it. Denies other side effects. Has not taken it in three days.   GERD- started on pepcid and protonix at last visit. Was not able to afford protonix, wanted [facility] try medications [facility]. Still with heartburn symptoms but pepcid helps some, taking twice per day.  Open to referral to congregational RN program to assist with medications.   Kinyarwanda interpretor used for duration of visit today.  PERTINENT  PMH / PSH: GERD, h/o PUD, h/o H pylori s/p treatment, constipation, fibroids   OBJECTIVE:   BP (!) 162/82   Pulse 88   Wt 176 lb (79.8 kg)   LMP 09/07/2018   SpO2 98%   BMI 29.29 kg/m   General: alert & oriented, no apparent distress, well groomed HEENT: normocephalic, atraumatic, EOM grossly intact, oral mucosa moist, neck supple Respiratory: normal respiratory effort GI: non-distended Skin: no rashes, no jaundice Psych: appropriate mood and affect    ASSESSMENT/PLAN:   Essential hypertension Not at goal today, not tolerating amlodipine due to side effects, hasn't taken in several days and only using PRN Repeat BMP today Will discontinue amlodipine and start hydrochlorothiazide 25mg  daily F/u in 2 weeks and repeat BMP  Gastroesophageal reflux disease [facility] Referral to congregational RN program to assist with medications Continue BID pepcid Assess at f/u if symptoms improved, consider BID x 1 month and then decrease to once daily     Billey Co, MD [facility]

## 2023-02-12 ENCOUNTER — Ambulatory Visit: Payer: 59 | Admitting: Family Medicine

## 2023-02-12 ENCOUNTER — Encounter: Payer: Self-pay | Admitting: Family Medicine

## 2023-02-12 VITALS — BP 162/82 | HR 88 | Wt 176.0 lb

## 2023-02-12 DIAGNOSIS — K219 Gastro-esophageal reflux disease without esophagitis: Secondary | ICD-10-CM | POA: Diagnosis not present

## 2023-02-12 DIAGNOSIS — I10 Essential (primary) hypertension: Secondary | ICD-10-CM

## 2023-02-12 MED ORDER — OMEPRAZOLE MAGNESIUM 20 MG PO TBEC: 20.0000 mg | DELAYED_RELEASE_TABLET | Freq: Two times a day (BID) | ORAL | 1 refills | Status: DC

## 2023-02-12 MED ORDER — HYDROCHLOROTHIAZIDE 25 MG PO TABS: 25.0000 mg | ORAL_TABLET | Freq: Every day | ORAL | 1 refills | Status: DC

## 2023-02-12 NOTE — Patient Instructions (Addendum)
It was wonderful to see you today.  Please bring ALL of your medications with you to every visit.   Today we talked about:  Ku wa kane 8 Kanama saa 9:50 AM  Nohereje kabiri buri munsi omeprazole muri farumasi yawe numuti mushya wumuvuduko ukabije wamaraso.  Hydrochlorathiazide- Mattie Marlin buri munsi  Omeprazole KABIRI buri munsi  Urakoze guhitamo Ubuvuzi [employer].   Sherlene Shams [phone] nibibazo byose bijyanye na gahunda yuyu munsi.  Nyamuneka uhageze byibuze iminota 15 mbere yuko [place].   Niba ufite amaraso uyumunsi, nzakoherereza ubutumwa bwa MyChart cyangwa ibaruwa niba ibisubizo ari ibisanzwe. Bitabaye ibyo, nzaguha umuhamagaro.   Niba ufite icyerekezo cyoherejwe, bazaguhamagara Lysle Dingwall. Nyamuneka uduhe guhamagara niba utongeye kumva mubyumweru 2 biri imbere.   Niba ukeneye kuzuzwa mbere yuko ubutaha, nyamuneka hamagara farumasi yawe.  Thursday August 8th at 9:50 AM  I sent twice daily omeprazole to your pharmacy and a new medicine for high blood pressure.  Hydrochlorathiazide- ONCE daily  Omeprazole TWICE daily  Thank you for choosing [facility].   Please call [phone] with any questions about today's appointment.  Please arrive at least 15 minutes prior to your scheduled appointments.   If you had blood work today, I will send you a MyChart message or a letter if results are normal. Otherwise, I will give you a call.   If you had a referral placed, they will call you to set up an appointment. Please give [place] a call if you don't hear back in the next 2 weeks.   If you need additional refills before your next appointment, please call your pharmacy first.   Burley Saver, MD  Family Medicine

## 2023-02-12 NOTE — Assessment & Plan Note (Signed)
[facility] Referral to congregational RN program to assist with medications Continue BID pepcid Assess at f/u if symptoms improved, consider BID x 1 month and then decrease to once daily

## 2023-02-12 NOTE — Assessment & Plan Note (Signed)
Not at goal today, not tolerating amlodipine due to side effects, hasn't taken in several days and only using PRN Repeat BMP today Will discontinue amlodipine and start hydrochlorothiazide 25mg  daily F/u in 2 weeks and repeat BMP

## 2023-02-25 ENCOUNTER — Ambulatory Visit (INDEPENDENT_AMBULATORY_CARE_PROVIDER_SITE_OTHER): Payer: 59 | Admitting: Student

## 2023-02-25 ENCOUNTER — Other Ambulatory Visit (HOSPITAL_COMMUNITY)
Admission: RE | Admit: 2023-02-25 | Discharge: 2023-02-25 | Disposition: A | Discharge status: Home / Self Care | Payer: 59 | Source: Ambulatory Visit | Attending: Family Medicine | Admitting: Family Medicine

## 2023-02-25 VITALS — BP 160/75 | HR 81 | Temp 98.3°F | Wt 172.4 lb

## 2023-02-25 DIAGNOSIS — B3731 Acute candidiasis of vulva and vagina: Secondary | ICD-10-CM | POA: Insufficient documentation

## 2023-02-25 DIAGNOSIS — N760 Acute vaginitis: Secondary | ICD-10-CM | POA: Diagnosis not present

## 2023-02-25 DIAGNOSIS — B9689 Other specified bacterial agents as the cause of diseases classified elsewhere: Secondary | ICD-10-CM | POA: Insufficient documentation

## 2023-02-25 DIAGNOSIS — N898 Other specified noninflammatory disorders of vagina: Secondary | ICD-10-CM

## 2023-02-25 DIAGNOSIS — I10 Essential (primary) hypertension: Secondary | ICD-10-CM

## 2023-02-25 LAB — POCT WET PREP (WET MOUNT)
Clue Cells Wet Prep Whiff POC: POSITIVE
Trichomonas Wet Prep HPF POC: ABSENT
WBC, Wet Prep HPF POC: >20

## 2023-02-25 MED ORDER — METRONIDAZOLE 500 MG PO TABS: 500.0000 mg | ORAL_TABLET | Freq: Two times a day (BID) | ORAL | 0 refills | Status: DC

## 2023-02-25 MED ORDER — HYDROCHLOROTHIAZIDE 25 MG PO TABS: 25.0000 mg | ORAL_TABLET | Freq: Every day | ORAL | 3 refills | Status: DC

## 2023-02-25 MED ORDER — FLUCONAZOLE 150 MG PO TABS: ORAL_TABLET | ORAL | 0 refills | Status: DC

## 2023-02-25 NOTE — Progress Notes (Signed)
    SUBJECTIVE:   CHIEF COMPLAINT / HPI:   Deborah Sampson is a 62 year old female here to follow-up on blood pressure, GERD.  In person [place] interpreter used throughout visit.  Hypertension Prescribed hydrochlorothiazide 25 mg daily, but she has not yet started taking it because of interpretation language barrier at the pharmacy.  Her husband drove her to the clinic today, and says that transportation is not a barrier for her. She denies any chest pain, headache, shortness of breath, swelling in her lower extremities. She wants to know if she can have an interpreter that only works with her for office visits and other needs.  Vaginal irritation Reports burning on her vulva both internally and externally. Desires STI screening Sexually active with her husband only. No fevers or chills.  PERTINENT  PMH / PSH: Peptic ulcer disease, GERD, hypertension, chronic constipation  OBJECTIVE:   BP (!) 160/75   Pulse 81   Temp 98.3 F (36.8 C)   Wt 172 lb 6.4 oz (78.2 kg)   LMP 09/07/2018   SpO2 98%   BMI 28.69 kg/m  General: Well-appearing, no distress Cardiac: Regular rate and rhythm Respiratory: Breathing normally on room air Psych: Pleasant mood and affect   ASSESSMENT/PLAN:   Essential hypertension BP 160/75 taking manually, repeat still 160s/70s. Sent hydrochlorothiazide 25 mg daily to [employer] to get mailed to her house. Discussed how to take the medication. F/u with PCP in 2-4 weeks.  Bacterial vaginosis POC wet prep positive for clue cells and positive whiff test Provided printed prescription for metronidazole 500 mg twice daily for 7 days Explained to patient how to take the medication and what it is for Explained she may take this prescription to any pharmacy  Vaginal yeast infection Treat w/ Diflucan 150 mg x1 at completion of Flagyl for BV, if symptoms remain can take 2nd dose     Darral Dash, DO [facility]

## 2023-02-25 NOTE — Patient Instructions (Addendum)
It was great seeing you today.  As we discussed, -I sent to your blood pressure medication at the pharmacy.  They will mail this to your house.  Please take this daily. - You have a vaginal yeast infection and bacterial vaginosis. We will treat this with an antibiotic (Metronidazole) for 7 days followed by an antifungal (Fluconazole) 1 tablet, then another tablet 72 hours later. -Please follow-up with your primary care provider, Dr. Miquel Dunn, in 2 weeks.   If you have any questions or concerns, please feel free to call the clinic.   Have a wonderful day,  Dr. Darral Dash [facility] [phone]

## 2023-02-25 NOTE — Assessment & Plan Note (Signed)
Treat w/ Diflucan 150 mg x1 at completion of Flagyl for BV, if symptoms remain can take 2nd dose

## 2023-02-25 NOTE — Assessment & Plan Note (Addendum)
BP 160/75 taking manually, repeat still 160s/70s. Sent hydrochlorothiazide 25 mg daily to [employer] to get mailed to her house. Discussed how to take the medication. F/u with PCP in 2-4 weeks.

## 2023-02-25 NOTE — Assessment & Plan Note (Signed)
POC wet prep positive for clue cells and positive whiff test Provided printed prescription for metronidazole 500 mg twice daily for 7 days Explained to patient how to take the medication and what it is for Explained she may take this prescription to any pharmacy

## 2023-03-01 ENCOUNTER — Telehealth: Payer: Self-pay | Admitting: Family Medicine

## 2023-03-01 NOTE — Telephone Encounter (Signed)
Patient came in stating that the medication that she was prescribed on 02/25/23 has been making her feel weak, and she has not bee able to work. She would like to get a letter excusing her from work if possible.  Please call her husband's phone, [phone], if there are any questions or when the letter is ready

## 2023-03-02 NOTE — Telephone Encounter (Signed)
Called patient using [employer] [phone].  Patient is aware of appointment and states that she will be here.  Glennie Hawk, CMA

## 2023-03-02 NOTE — Telephone Encounter (Signed)
Please schedule a visit with any provider this week as soon as possible  Dr. Melissa Noon would be best as recently saw her Please schedule for 40 minutes given complexity Physician who sees her will determine appropriateness of work note   Deborah Starr, MD  [facility] Teaching Service

## 2023-03-02 NOTE — Telephone Encounter (Signed)
Appointment scheduled with Dr. Deirdre Priest on 03/03/2023 at 1450 for 40 minutes.  Glennie Hawk, CMA

## 2023-03-03 ENCOUNTER — Ambulatory Visit (INDEPENDENT_AMBULATORY_CARE_PROVIDER_SITE_OTHER): Payer: 59 | Admitting: Family Medicine

## 2023-03-03 ENCOUNTER — Encounter: Payer: Self-pay | Admitting: Family Medicine

## 2023-03-03 VITALS — BP 145/92 | HR 99 | Ht 65.0 in | Wt 165.4 lb

## 2023-03-03 DIAGNOSIS — I10 Essential (primary) hypertension: Secondary | ICD-10-CM | POA: Diagnosis not present

## 2023-03-03 DIAGNOSIS — B9689 Other specified bacterial agents as the cause of diseases classified elsewhere: Secondary | ICD-10-CM

## 2023-03-03 DIAGNOSIS — N760 Acute vaginitis: Secondary | ICD-10-CM

## 2023-03-03 DIAGNOSIS — K219 Gastro-esophageal reflux disease without esophagitis: Secondary | ICD-10-CM | POA: Diagnosis not present

## 2023-03-03 DIAGNOSIS — B3731 Acute candidiasis of vulva and vagina: Secondary | ICD-10-CM

## 2023-03-03 MED ORDER — AMLODIPINE BESYLATE 5 MG PO TABS: 5.0000 mg | ORAL_TABLET | Freq: Every day | ORAL | 3 refills | Status: DC

## 2023-03-03 MED ORDER — FAMOTIDINE 20 MG PO TABS: 20.0000 mg | ORAL_TABLET | Freq: Two times a day (BID) | ORAL | 0 refills | Status: DC

## 2023-03-03 NOTE — Assessment & Plan Note (Signed)
I think her current epigastric pain is consistent with reflux worsened by metronidazole.  Sent rx for [facility].  No signs of acute abdomen disease - cholecystitis or appendicitis or diverticulitis

## 2023-03-03 NOTE — Assessment & Plan Note (Signed)
Not at goal.  Seems not to be taking amlodipine.  [facility] and discussed in detail the two blood pressure medications she is to take every day.  Has follow up appointment end of August

## 2023-03-03 NOTE — Patient Instructions (Addendum)
Nibyiza Macao uyumunsi - Urakoze kwinjira  Ibintu twaganiriyeho uyu munsi:  Almyra Free - kurangiza metronidazole 1 tablet kabiri kumunsi muminsi 2 - hanyuma fata ibinini bya flucanozole -  Kubabara Igifu Umutima Gutwika Fata Pepcide kabiri kumunsi   Umuvuduko w'amaraso - fata hydrochlorothiazide imwe buri gitondo   Nyamuneka burigihe uzane amacupa yawe yimiti  [address] Thomas[address] Bottoms [address] [place]   Good to see you today - Thank you for coming in  Things we discussed today:  Itching - finish the metronidazole 1 tablet twice a day for 2 more days - then take one flucanozole tablet -  Stomach Pain Heart Burn Reflux Take Pepcid twice a day   Blood Pressure - take the hydrochlorothiazide one every morning   Please always bring your medication bottles  See Dr Miquel Dunn in August 27

## 2023-03-03 NOTE — Progress Notes (Signed)
SUBJECTIVE:   CHIEF COMPLAINT / HPI:   Stomach Upset She feels the metronidazole makes her stomach feel bad and that her reflux is worse.  No vomiting or diarrhea or bleeding but does feel nausea.  Feels week and sometimes dizzy.  Feels dehyrated although drinking about 6 bottles of water a day. Feels likes she can't work while taking this medication.  Does not have any omeprazole or pepcid  Vaginal itching Still has some.  Has taken what appears to be 10 tabs of metronidazole has 4 left Has not taken any diflucan - 2 left   Hypertension Has hydrochlorothiazide bottle and reports taking daily.  Does not have amlodipine.  Was last sent to [facility].  [facility]   OBJECTIVE:   BP (!) 145/92   Pulse 99   Ht 5\' 5"  (1.651 m)   Wt 165 lb 6.4 oz (75 kg)   LMP 09/07/2018   SpO2 96%   BMI 27.52 kg/m   Alert no distress Mobility:able to get up and down from exam table without assistance or distress When discussing her work excuse does seem to demonstrate indicate unsteadiness when stands up Able to leave exam room with normal gait Heart - Regular rate and rhythm.  No murmurs, gallops or rubs.    Lungs:  Normal respiratory effort, chest expands symmetrically. Lungs are clear to auscultation, no crackles or wheezes. Abdomen - indicates epigastric tenderness.  No guarding or rebound Extrem - no edema   ASSESSMENT/PLAN:   Bacterial vaginosis Assessment & Plan: Not improved.  Suggested she try to finish her metronidazole.  She feels she can but that she can't work when taking.  Wrote her a work excuse to be out the next two days.      Essential hypertension Assessment & Plan: Not at goal.  Seems not to be taking amlodipine.  [facility] and discussed in detail the two blood pressure medications she is to take every day.  Has follow up appointment end of August   Orders: -     amLODIPine Besylate; Take 1 tablet (5 mg total) by mouth at  bedtime.  Dispense: 90 tablet; Refill: 3  Gastroesophageal reflux disease without esophagitis Assessment & Plan: I think her current epigastric pain is consistent with reflux worsened by metronidazole.  [facility]  No signs of acute abdomen disease - cholecystitis or appendicitis or diverticulitis   Orders: -     Famotidine; Take 1 tablet (20 mg total) by mouth 2 (two) times daily. (Patient not taking: Reported on 03/03/2023)  Dispense: 60 tablet; Refill: 0  Vaginal yeast infection Assessment & Plan: Recommend she take one diflucan once finished with metronidazole      Patient Instructions  Nibyiza kukubona uyumunsi - Urakoze kwinjira  Ibintu twaganiriyeho uyu munsi:  Gucura - kurangiza metronidazole 1 tablet kabiri kumunsi muminsi 2 - hanyuma fata ibinini bya flucanozole -  Kubabara Igifu Umutima Gutwika Fata Pepcide kabiri kumunsi   Umuvuduko w'amaraso - fata hydrochlorothiazide imwe buri gitondo   Nyamuneka burigihe uzane amacupa yawe yimiti  Reba Dr Thomasenia Bottoms [address] [place]   Good to see you today - Thank you for coming in  Things we discussed today:  Itching - finish the metronidazole 1 tablet twice a day for 2 more days - then take one flucanozole tablet -  Stomach Pain Heart Burn Reflux Take Pepcid twice a day   Blood Pressure - take the hydrochlorothiazide one  every morning   Please always bring your medication bottles  See Dr Miquel Dunn in August 27      Carney Living, MD [facility]

## 2023-03-03 NOTE — Assessment & Plan Note (Signed)
Recommend she take one diflucan once finished with metronidazole

## 2023-03-03 NOTE — Assessment & Plan Note (Signed)
Not improved.  Suggested she try to finish her metronidazole.  She feels she can but that she can't work when taking.  Wrote her a work excuse to be out the next two days.

## 2023-03-06 ENCOUNTER — Emergency Department (HOSPITAL_COMMUNITY): Payer: 59

## 2023-03-06 ENCOUNTER — Inpatient Hospital Stay (HOSPITAL_COMMUNITY)
Admission: EM | Admit: 2023-03-06 | Discharge: 2023-03-09 | DRG: 638 | Disposition: A | Discharge status: Home / Self Care | Payer: 59 | Attending: Family Medicine | Admitting: Family Medicine

## 2023-03-06 ENCOUNTER — Other Ambulatory Visit: Payer: Self-pay

## 2023-03-06 ENCOUNTER — Encounter (HOSPITAL_COMMUNITY): Payer: Self-pay

## 2023-03-06 DIAGNOSIS — I10 Essential (primary) hypertension: Secondary | ICD-10-CM | POA: Diagnosis present

## 2023-03-06 DIAGNOSIS — D259 Leiomyoma of uterus, unspecified: Secondary | ICD-10-CM | POA: Diagnosis present

## 2023-03-06 DIAGNOSIS — R109 Unspecified abdominal pain: Secondary | ICD-10-CM | POA: Diagnosis present

## 2023-03-06 DIAGNOSIS — E876 Hypokalemia: Secondary | ICD-10-CM | POA: Diagnosis not present

## 2023-03-06 DIAGNOSIS — E1110 Type 2 diabetes mellitus with ketoacidosis without coma: Principal | ICD-10-CM

## 2023-03-06 DIAGNOSIS — R911 Solitary pulmonary nodule: Secondary | ICD-10-CM | POA: Diagnosis present

## 2023-03-06 DIAGNOSIS — E1165 Type 2 diabetes mellitus with hyperglycemia: Secondary | ICD-10-CM | POA: Diagnosis present

## 2023-03-06 DIAGNOSIS — N760 Acute vaginitis: Secondary | ICD-10-CM | POA: Diagnosis present

## 2023-03-06 DIAGNOSIS — K5900 Constipation, unspecified: Secondary | ICD-10-CM | POA: Diagnosis present

## 2023-03-06 DIAGNOSIS — N179 Acute kidney failure, unspecified: Secondary | ICD-10-CM | POA: Diagnosis present

## 2023-03-06 DIAGNOSIS — R739 Hyperglycemia, unspecified: Secondary | ICD-10-CM | POA: Diagnosis present

## 2023-03-06 DIAGNOSIS — N898 Other specified noninflammatory disorders of vagina: Secondary | ICD-10-CM

## 2023-03-06 DIAGNOSIS — R1084 Generalized abdominal pain: Secondary | ICD-10-CM | POA: Diagnosis present

## 2023-03-06 LAB — BASIC METABOLIC PANEL
Anion gap: 29 — ABNORMAL HIGH (ref 5–15)
Anion gap: 23 — ABNORMAL HIGH (ref 5–15)
Anion gap: 21 — ABNORMAL HIGH (ref 5–15)
Anion gap: 12 (ref 5–15)
BUN: 20 mg/dL (ref 6–20)
BUN: 16 mg/dL (ref 6–20)
BUN: 18 mg/dL (ref 6–20)
BUN: 16 mg/dL (ref 6–20)
CO2: 10 mmol/L — ABNORMAL LOW (ref 22–32)
CO2: 8 mmol/L — ABNORMAL LOW (ref 22–32)
CO2: 11 mmol/L — ABNORMAL LOW (ref 22–32)
CO2: 15 mmol/L — ABNORMAL LOW (ref 22–32)
Calcium: 9 mg/dL (ref 8.9–10.3)
Calcium: 8.7 mg/dL — ABNORMAL LOW (ref 8.9–10.3)
Calcium: 8.9 mg/dL (ref 8.9–10.3)
Calcium: 8.9 mg/dL (ref 8.9–10.3)
Chloride: 92 mmol/L — ABNORMAL LOW (ref 98–111)
Chloride: 98 mmol/L (ref 98–111)
Chloride: 104 mmol/L (ref 98–111)
Chloride: 106 mmol/L (ref 98–111)
Creatinine, Ser: 1.42 mg/dL — ABNORMAL HIGH (ref 0.44–1.00)
Creatinine, Ser: 1.25 mg/dL — ABNORMAL HIGH (ref 0.44–1.00)
Creatinine, Ser: 1.17 mg/dL — ABNORMAL HIGH (ref 0.44–1.00)
Creatinine, Ser: 1.03 mg/dL — ABNORMAL HIGH (ref 0.44–1.00)
GFR, Estimated: 42 mL/min — ABNORMAL LOW (ref >60)
GFR, Estimated: 49 mL/min — ABNORMAL LOW (ref >60)
GFR, Estimated: 53 mL/min — ABNORMAL LOW (ref >60)
GFR, Estimated: >60 mL/min (ref >60)
Glucose, Bld: 536 mg/dL (ref 70–99)
Glucose, Bld: 427 mg/dL — ABNORMAL HIGH (ref 70–99)
Glucose, Bld: 197 mg/dL — ABNORMAL HIGH (ref 70–99)
Glucose, Bld: 193 mg/dL — ABNORMAL HIGH (ref 70–99)
Potassium: 4.5 mmol/L (ref 3.5–5.1)
Potassium: 4.4 mmol/L (ref 3.5–5.1)
Potassium: 3.9 mmol/L (ref 3.5–5.1)
Potassium: 3.8 mmol/L (ref 3.5–5.1)
Sodium: 131 mmol/L — ABNORMAL LOW (ref 135–145)
Sodium: 129 mmol/L — ABNORMAL LOW (ref 135–145)
Sodium: 136 mmol/L (ref 135–145)
Sodium: 133 mmol/L — ABNORMAL LOW (ref 135–145)

## 2023-03-06 LAB — HEMOGLOBIN A1C
Hgb A1c MFr Bld: 14.3 % — ABNORMAL HIGH (ref 4.8–5.6)
Mean Plasma Glucose: 363.71 mg/dL

## 2023-03-06 LAB — CBC WITH DIFFERENTIAL/PLATELET
Abs Immature Granulocytes: 0.03 10*3/uL (ref 0.00–0.07)
Basophils Absolute: 0 10*3/uL (ref 0.0–0.1)
Basophils Relative: 1 %
Eosinophils Absolute: 0 10*3/uL (ref 0.0–0.5)
Eosinophils Relative: 0 %
HCT: 50.8 % — ABNORMAL HIGH (ref 36.0–46.0)
Hemoglobin: 16.3 g/dL — ABNORMAL HIGH (ref 12.0–15.0)
Immature Granulocytes: 1 %
Lymphocytes Relative: 27 %
Lymphs Abs: 1.4 10*3/uL (ref 0.7–4.0)
MCH: 26.9 pg (ref 26.0–34.0)
MCHC: 32.1 g/dL (ref 30.0–36.0)
MCV: 83.7 fL (ref 80.0–100.0)
Monocytes Absolute: 0.2 10*3/uL (ref 0.1–1.0)
Monocytes Relative: 4 %
Neutro Abs: 3.6 10*3/uL (ref 1.7–7.7)
Neutrophils Relative %: 67 %
Platelets: 289 10*3/uL (ref 150–400)
RBC: 6.07 MIL/uL — ABNORMAL HIGH (ref 3.87–5.11)
RDW: 12.7 % (ref 11.5–15.5)
WBC: 5.3 10*3/uL (ref 4.0–10.5)
nRBC: 0 % (ref 0.0–0.2)

## 2023-03-06 LAB — URINALYSIS, ROUTINE W REFLEX MICROSCOPIC
Bilirubin Urine: NEGATIVE
Glucose, UA: >=500 mg/dL — AB
Ketones, ur: 80 mg/dL — AB
Leukocytes,Ua: NEGATIVE
Nitrite: NEGATIVE
Protein, ur: 30 mg/dL — AB
Specific Gravity, Urine: 1.042 — ABNORMAL HIGH (ref 1.005–1.030)
pH: 5 (ref 5.0–8.0)

## 2023-03-06 LAB — I-STAT VENOUS BLOOD GAS, ED
Acid-base deficit: 18 mmol/L — ABNORMAL HIGH (ref 0.0–2.0)
Bicarbonate: 9.9 mmol/L — ABNORMAL LOW (ref 20.0–28.0)
Calcium, Ion: 1.15 mmol/L (ref 1.15–1.40)
HCT: 52 % — ABNORMAL HIGH (ref 36.0–46.0)
Hemoglobin: 17.7 g/dL — ABNORMAL HIGH (ref 12.0–15.0)
O2 Saturation: 63 %
Potassium: 4.7 mmol/L (ref 3.5–5.1)
Sodium: 130 mmol/L — ABNORMAL LOW (ref 135–145)
TCO2: 11 mmol/L — ABNORMAL LOW (ref 22–32)
pCO2, Ven: 28.9 mmHg — ABNORMAL LOW (ref 44–60)
pH, Ven: 7.143 — CL (ref 7.25–7.43)
pO2, Ven: 42 mmHg (ref 32–45)

## 2023-03-06 LAB — CBG MONITORING, ED
Glucose-Capillary: 505 mg/dL (ref 70–99)
Glucose-Capillary: 418 mg/dL — ABNORMAL HIGH (ref 70–99)
Glucose-Capillary: 471 mg/dL — ABNORMAL HIGH (ref 70–99)
Glucose-Capillary: 358 mg/dL — ABNORMAL HIGH (ref 70–99)
Glucose-Capillary: 230 mg/dL — ABNORMAL HIGH (ref 70–99)
Glucose-Capillary: 152 mg/dL — ABNORMAL HIGH (ref 70–99)
Glucose-Capillary: 164 mg/dL — ABNORMAL HIGH (ref 70–99)
Glucose-Capillary: 179 mg/dL — ABNORMAL HIGH (ref 70–99)
Glucose-Capillary: 213 mg/dL — ABNORMAL HIGH (ref 70–99)
Glucose-Capillary: 187 mg/dL — ABNORMAL HIGH (ref 70–99)
Glucose-Capillary: 225 mg/dL — ABNORMAL HIGH (ref 70–99)

## 2023-03-06 LAB — I-STAT CHEM 8, ED
BUN: 22 mg/dL — ABNORMAL HIGH (ref 6–20)
Calcium, Ion: 1.16 mmol/L (ref 1.15–1.40)
Chloride: 101 mmol/L (ref 98–111)
Creatinine, Ser: 1 mg/dL (ref 0.44–1.00)
Glucose, Bld: 561 mg/dL (ref 70–99)
HCT: 53 % — ABNORMAL HIGH (ref 36.0–46.0)
Hemoglobin: 18 g/dL — ABNORMAL HIGH (ref 12.0–15.0)
Potassium: 4.7 mmol/L (ref 3.5–5.1)
Sodium: 130 mmol/L — ABNORMAL LOW (ref 135–145)
TCO2: 12 mmol/L — ABNORMAL LOW (ref 22–32)

## 2023-03-06 LAB — MAGNESIUM: Magnesium: 2.3 mg/dL (ref 1.7–2.4)

## 2023-03-06 LAB — GLUCOSE, CAPILLARY: Glucose-Capillary: 160 mg/dL — ABNORMAL HIGH (ref 70–99)

## 2023-03-06 LAB — HIV ANTIBODY (ROUTINE TESTING W REFLEX): HIV Screen 4th Generation wRfx: NONREACTIVE

## 2023-03-06 LAB — LIPASE, BLOOD: Lipase: 39 U/L (ref 11–51)

## 2023-03-06 LAB — TROPONIN I (HIGH SENSITIVITY)
Troponin I (High Sensitivity): 8 ng/L (ref <18)
Troponin I (High Sensitivity): 15 ng/L (ref <18)

## 2023-03-06 LAB — HEPATIC FUNCTION PANEL
ALT: 25 U/L (ref 0–44)
AST: 18 U/L (ref 15–41)
Albumin: 4 g/dL (ref 3.5–5.0)
Alkaline Phosphatase: 106 U/L (ref 38–126)
Bilirubin, Direct: 0.1 mg/dL (ref 0.0–0.2)
Indirect Bilirubin: 1.1 mg/dL — ABNORMAL HIGH (ref 0.3–0.9)
Total Bilirubin: 1.2 mg/dL (ref 0.3–1.2)
Total Protein: 8.5 g/dL — ABNORMAL HIGH (ref 6.5–8.1)

## 2023-03-06 LAB — I-STAT CG4 LACTIC ACID, ED
Lactic Acid, Venous: 3.1 mmol/L (ref 0.5–1.9)
Lactic Acid, Venous: 2.1 mmol/L (ref 0.5–1.9)

## 2023-03-06 LAB — BETA-HYDROXYBUTYRIC ACID
Beta-Hydroxybutyric Acid: >8 mmol/L — ABNORMAL HIGH (ref 0.05–0.27)
Beta-Hydroxybutyric Acid: 7.99 mmol/L — ABNORMAL HIGH (ref 0.05–0.27)

## 2023-03-06 MED ORDER — DEXTROSE IN LACTATED RINGERS 5 % IV SOLN: INTRAVENOUS | Status: DC

## 2023-03-06 MED ORDER — LACTATED RINGERS IV SOLN: INTRAVENOUS | Status: DC

## 2023-03-06 MED ORDER — SODIUM CHLORIDE 0.9 % IV SOLN: Freq: Once | INTRAVENOUS | Status: AC

## 2023-03-06 MED ORDER — LACTATED RINGERS IV BOLUS: 1000.0000 mL | Freq: Once | INTRAVENOUS | Status: DC

## 2023-03-06 MED ORDER — METOCLOPRAMIDE HCL 5 MG/ML IJ SOLN
10.0000 mg | Freq: Once | INTRAMUSCULAR | Status: AC
  Administered 2023-03-06: 10 mg via INTRAVENOUS
  Filled 2023-03-06: qty 2

## 2023-03-06 MED ORDER — ENOXAPARIN SODIUM 40 MG/0.4ML IJ SOSY
40.0000 mg | PREFILLED_SYRINGE | INTRAMUSCULAR | Status: DC
  Administered 2023-03-06 – 2023-03-08 (×3): 40 mg via SUBCUTANEOUS
  Filled 2023-03-06 (×3): qty 0.4

## 2023-03-06 MED ORDER — AMLODIPINE BESYLATE 5 MG PO TABS
5.0000 mg | ORAL_TABLET | Freq: Every day | ORAL | Status: DC
  Administered 2023-03-06 – 2023-03-08 (×3): 5 mg via ORAL
  Filled 2023-03-06 (×3): qty 1

## 2023-03-06 MED ORDER — DEXTROSE 50 % IV SOLN: 0.0000 mL | INTRAVENOUS | Status: DC | PRN

## 2023-03-06 MED ORDER — LACTATED RINGERS IV BOLUS
20.0000 mL/kg | Freq: Once | INTRAVENOUS | Status: AC
  Administered 2023-03-06: 1500 mL via INTRAVENOUS

## 2023-03-06 MED ORDER — FENTANYL CITRATE PF 50 MCG/ML IJ SOSY
50.0000 ug | PREFILLED_SYRINGE | Freq: Once | INTRAMUSCULAR | Status: AC
  Administered 2023-03-06: 50 ug via INTRAVENOUS
  Filled 2023-03-06: qty 1

## 2023-03-06 MED ORDER — IOHEXOL 350 MG/ML SOLN
75.0000 mL | Freq: Once | INTRAVENOUS | Status: AC | PRN
  Administered 2023-03-06: 75 mL via INTRAVENOUS

## 2023-03-06 MED ORDER — POLYETHYLENE GLYCOL 3350 17 G PO PACK: 17.0000 g | PACK | Freq: Every day | ORAL | Status: DC | PRN

## 2023-03-06 MED ORDER — POTASSIUM CHLORIDE 10 MEQ/100ML IV SOLN
10.0000 meq | INTRAVENOUS | Status: AC
  Administered 2023-03-06 (×2): 10 meq via INTRAVENOUS
  Filled 2023-03-06 (×2): qty 100

## 2023-03-06 MED ORDER — INSULIN REGULAR(HUMAN) IN NACL 100-0.9 UT/100ML-% IV SOLN
INTRAVENOUS | Status: DC
  Administered 2023-03-06: 15 [IU]/h via INTRAVENOUS
  Administered 2023-03-07: 4.2 [IU]/h via INTRAVENOUS
  Filled 2023-03-06 (×3): qty 100

## 2023-03-06 NOTE — ED Notes (Signed)
Pt transported to CT ?

## 2023-03-06 NOTE — Assessment & Plan Note (Addendum)
Hx of polyuria, polydipsia, generalized fatigue.  Blood glucose 505 on admission with anion gap. A1c 14.3. s/p 2.5L LR bolus in ED, started on regular insulin per [employer]. BG improved to 152. -Continue regular insulin per [employer] -LR at 181mL/hr -BMP q4hr -beta-hydroxybutyric acid q8hr -C-peptide  LR bolus  - transition to subcutaneous insulin after anion gap is closed x 2

## 2023-03-06 NOTE — ED Provider Notes (Signed)
[place] EMERGENCY DEPARTMENT AT [facility] Provider Note   CSN: [id number] Arrival date & time: 03/06/23  1119     History  Chief Complaint  Patient presents with   Abdominal Pain    Deborah Sampson is a 62 y.o. female.  The history is provided by the patient. The history is limited by a language barrier. A language interpreter was used.  Abdominal Pain Associated symptoms: fatigue, nausea and vomiting   Presents for polyuria, polydipsia, generalized weakness.  Medical history includes constipation, GERD, PUD, fibroid uterus, HTN.  She was seen by PCP 3 days ago for symptoms of nausea during course of Flagyl for treatment of BV.  She was prescribed Pepcid for empiric treatment of reflux.  Since that time, she has had ongoing nausea and vomiting.  She has persistent thirst and has been urinating frequently.  She describes an epigastric pain.  She has ongoing vaginal irritation.  She states that she has never been diagnosed with diabetes in the past.     Home Medications Prior to Admission medications   Medication Sig Start Date End Date Taking? Authorizing Provider  amLODipine (NORVASC) 5 MG tablet Take 1 tablet (5 mg total) by mouth at bedtime. 03/03/23   Carney Living, MD  famotidine (PEPCID) 20 MG tablet Take 1 tablet (20 mg total) by mouth 2 (two) times daily. Patient not taking: Reported on 03/03/2023 03/03/23   Carney Living, MD  fluconazole (DIFLUCAN) 150 MG tablet Take 1 tablet (150 mg) by mouth. Take another tablet 3 days later. Patient not taking: Reported on 03/03/2023 02/25/23   Darral Dash, DO  hydrochlorothiazide (HYDRODIURIL) 25 MG tablet Take 1 tablet (25 mg total) by mouth daily. 02/12/23   Billey Co, MD  metroNIDAZOLE (FLAGYL) 500 MG tablet Take 1 tablet (500 mg total) by mouth 2 (two) times daily. 02/25/23   Dameron, Nolberto Hanlon, DO  omeprazole (PRILOSEC OTC) 20 MG tablet Take 1 tablet (20 mg total) by mouth in the morning and at  bedtime. Patient not taking: Reported on 03/03/2023 02/12/23   Billey Co, MD      Allergies    Penicillins    Review of Systems   Review of Systems  Constitutional:  Positive for activity change, appetite change and fatigue.  Gastrointestinal:  Positive for abdominal pain, nausea and vomiting.  Endocrine: Positive for polydipsia and polyuria.  Neurological:  Positive for weakness (Generalized).  All other systems reviewed and are negative.   Physical Exam Updated Vital Signs BP (!) 148/88   Pulse 94   Temp 98.6 F (37 C) (Oral)   Resp (!) 22   LMP 09/07/2018   SpO2 100%  Physical Exam Vitals and nursing note reviewed.  Constitutional:      General: She is not in acute distress.    Appearance: She is well-developed. She is ill-appearing. She is not toxic-appearing or diaphoretic.  HENT:     Head: Normocephalic and atraumatic.     Mouth/Throat:     Mouth: Mucous membranes are moist.  Eyes:     General: No scleral icterus.    Conjunctiva/sclera: Conjunctivae normal.  Cardiovascular:     Rate and Rhythm: Normal rate and regular rhythm.     Heart sounds: No murmur heard. Pulmonary:     Effort: Pulmonary effort is normal. No respiratory distress.     Breath sounds: Normal breath sounds. No wheezing or rales.  Abdominal:     Palpations: Abdomen is soft.  Tenderness: There is generalized abdominal tenderness. There is no guarding or rebound.  Musculoskeletal:        General: No swelling.     Cervical back: Neck supple.  Skin:    General: Skin is warm and dry.     Coloration: Skin is not cyanotic, jaundiced or pale.  Neurological:     General: No focal deficit present.     Mental Status: She is alert and oriented to person, place, and time.  Psychiatric:        Mood and Affect: Mood normal.        Behavior: Behavior normal.     ED Results / Procedures / Treatments   Labs (all labs ordered are listed, but only abnormal results are displayed) Labs Reviewed   BASIC METABOLIC PANEL - Abnormal; Notable for the following components:      Result Value   Sodium 131 (*)    Chloride 92 (*)    CO2 10 (*)    Glucose, Bld 536 (*)    Creatinine, Ser 1.42 (*)    GFR, Estimated 42 (*)    Anion gap 29 (*)    All other components within normal limits  BETA-HYDROXYBUTYRIC ACID - Abnormal; Notable for the following components:   Beta-Hydroxybutyric Acid >8.00 (*)    All other components within normal limits  CBC WITH DIFFERENTIAL/PLATELET - Abnormal; Notable for the following components:   RBC 6.07 (*)    Hemoglobin 16.3 (*)    HCT 50.8 (*)    All other components within normal limits  HEPATIC FUNCTION PANEL - Abnormal; Notable for the following components:   Total Protein 8.5 (*)    Indirect Bilirubin 1.1 (*)    All other components within normal limits  CBG MONITORING, ED - Abnormal; Notable for the following components:   Glucose-Capillary 505 (*)    All other components within normal limits  CBG MONITORING, ED - Abnormal; Notable for the following components:   Glucose-Capillary 471 (*)    All other components within normal limits  I-STAT VENOUS BLOOD GAS, ED - Abnormal; Notable for the following components:   pH, Ven 7.143 (*)    pCO2, Ven 28.9 (*)    Bicarbonate 9.9 (*)    TCO2 11 (*)    Acid-base deficit 18.0 (*)    Sodium 130 (*)    HCT 52.0 (*)    Hemoglobin 17.7 (*)    All other components within normal limits  I-STAT CHEM 8, ED - Abnormal; Notable for the following components:   Sodium 130 (*)    BUN 22 (*)    Glucose, Bld 561 (*)    TCO2 12 (*)    Hemoglobin 18.0 (*)    HCT 53.0 (*)    All other components within normal limits  I-STAT CG4 LACTIC ACID, ED - Abnormal; Notable for the following components:   Lactic Acid, Venous 3.1 (*)    All other components within normal limits  MAGNESIUM  BASIC METABOLIC PANEL  BASIC METABOLIC PANEL  BASIC METABOLIC PANEL  BETA-HYDROXYBUTYRIC ACID  URINALYSIS, ROUTINE W REFLEX  MICROSCOPIC  TROPONIN I (HIGH SENSITIVITY)    EKG EKG Interpretation Date/Time:  Saturday March 06 2023 11:30:06 EDT Ventricular Rate:  110 PR Interval:  136 QRS Duration:  74 QT Interval:  458 QTC Calculation: 619 R Axis:   28  Text Interpretation: Sinus tachycardia Nonspecific ST and T wave abnormality Prolonged QT Confirmed by Gloris Manchester [phone]) on 03/06/2023 12:54:32 PM  Radiology CT  ABDOMEN PELVIS W CONTRAST  Result Date: 03/06/2023 CLINICAL DATA:  Nonlocalized abdominal pain. EXAM: CT ABDOMEN AND PELVIS WITH CONTRAST TECHNIQUE: Multidetector CT imaging of the abdomen and pelvis was performed using the standard protocol following bolus administration of intravenous contrast. RADIATION DOSE REDUCTION: This exam was performed according to the departmental dose-optimization program which includes automated exposure control, adjustment of the mA and/or kV according to patient size and/or use of iterative reconstruction technique. CONTRAST:  75mL OMNIPAQUE IOHEXOL 350 MG/ML SOLN COMPARISON:  11/21/2018 FINDINGS: Lower chest: 8 mm left lower lobe pulmonary nodule evident (image 33/5). Hepatobiliary: No suspicious focal abnormality within the liver parenchyma. There is no evidence for gallstones, gallbladder wall thickening, or pericholecystic fluid. No intrahepatic or extrahepatic biliary dilation. Pancreas: No focal mass lesion. No dilatation of the main duct. No intraparenchymal cyst. No peripancreatic edema. Spleen: No splenomegaly. No suspicious focal mass lesion. Adrenals/Urinary Tract: No adrenal nodule or mass. Low-density renal lesions bilaterally are too small to characterize but similar to prior and compatible with cyst. No followup imaging is recommended. No evidence for hydroureter. The urinary bladder appears normal for the degree of distention. Stomach/Bowel: Stomach is distended with food and fluid. Duodenum is normally positioned as is the ligament of Treitz. No small bowel wall  thickening. No small bowel dilatation. The terminal ileum is normal. Nonvisualization of the appendix is consistent with the reported history of appendectomy. No gross colonic mass. No colonic wall thickening. Vascular/Lymphatic: No abdominal aortic aneurysm. No abdominal aortic atherosclerotic calcification. There is no gastrohepatic or hepatoduodenal ligament lymphadenopathy. No retroperitoneal or mesenteric lymphadenopathy. No pelvic sidewall lymphadenopathy. Reproductive: Small fibroids noted in the uterus. There is no adnexal mass. Other: No intraperitoneal free fluid. Musculoskeletal: No worrisome lytic or sclerotic osseous abnormality. IMPRESSION: 1. No acute findings in the abdomen or pelvis. Specifically, no findings to explain the patient's history of abdominal pain. 2. 8 mm left lower lobe pulmonary nodule. Non-contrast chest CT at 6-12 months is recommended. If the nodule is stable at time of repeat CT, then future CT at 18-24 months (from today's scan) is considered optional for low-risk patients, but is recommended for high-risk patients. This recommendation follows the consensus statement: Guidelines for Management of Incidental Pulmonary Nodules Detected on CT Images: From the Fleischner Society 2017; Radiology 2017; 284:228-243. Electronically Signed   By: Kennith Center M.D.   On: 03/06/2023 13:43    Procedures Procedures    Medications Ordered in ED Medications  insulin regular, human (MYXREDLIN) 100 units/ 100 mL infusion (has no administration in time range)  lactated ringers infusion (has no administration in time range)  dextrose 5 % in lactated ringers infusion (has no administration in time range)  dextrose 50 % solution 0-50 mL (has no administration in time range)  potassium chloride 10 mEq in 100 mL IVPB (10 mEq Intravenous New Bag/Given 03/06/23 1350)  lactated ringers bolus 1,500 mL (1,500 mLs Intravenous New Bag/Given 03/06/23 1223)  metoCLOPramide (REGLAN) injection 10 mg  (10 mg Intravenous Given 03/06/23 1316)  fentaNYL (SUBLIMAZE) injection 50 mcg (50 mcg Intravenous Given 03/06/23 1316)  0.9 %  sodium chloride infusion ( Intravenous New Bag/Given 03/06/23 1348)  iohexol (OMNIPAQUE) 350 MG/ML injection 75 mL (75 mLs Intravenous Contrast Given 03/06/23 1336)    ED Course/ Medical Decision Making/ A&P                                 Medical Decision Making Amount  and/or Complexity of Data Reviewed Labs: ordered. Radiology: ordered.  Risk Prescription drug management.   This patient presents to the ED for concern of nausea, vomiting, fatigue, this involves an extensive number of treatment options, and is a complaint that carries with it a high risk of complications and morbidity.  The differential diagnosis includes DKA, dehydration, neoplasm, SBO, intra-abdominal infection, other infection, metabolic derangements   Co morbidities that complicate the patient evaluation  constipation, GERD, PUD, fibroid uterus, HTN   Additional history obtained:  Additional history obtained from N/A External records from outside source obtained and reviewed including EMR   Lab Tests:  I Ordered, and personally interpreted labs.  The pertinent results include: Hemoconcentration on CBC consistent with dehydration; anion gap metabolic acidosis with hyperglycemia and elevated serum ketones consistent with DKA   Imaging Studies ordered:  I ordered imaging studies including CT of abdomen pelvis I independently visualized and interpreted imaging which showed no acute findings I agree with the radiologist interpretation   Cardiac Monitoring: / EKG:  The patient was maintained on a cardiac monitor.  I personally viewed and interpreted the cardiac monitored which showed an underlying rhythm of: Sinus rhythm   Problem List / ED Course / Critical interventions / Medication management  Patient presenting for epigastric pain, nausea, vomiting, polydipsia, polyuria,  generalized weakness, and fatigue.  Symptoms have been ongoing over the past week.  She is currently being treated for BV with Flagyl.  She has no known history of diabetes.  Blood sugar on arrival is in the range of 500.  Patient was started on IV fluids for hydration.  Lab work was initiated.  On exam, patient's abdomen is soft.  She does endorse a generalized tenderness.  She has deep inspirations suggestive of acidosis.  Blood gas and beta hydroxybutyrate confirm DKA.  Patient started on insulin gtt.  Additional IV fluids were ordered.  CT scan not show any acute findings.  Patient had improvement in tachycardia.  She was admitted for further management. I ordered medication including IV fluids and insulin gtt. for DKA; Reglan for nausea; fentanyl for abdominal pain Reevaluation of the patient after these medicines showed that the patient improved I have reviewed the patients home medicines and have made adjustments as needed   Social Determinants of Health:  Non-English speaking  CRITICAL CARE Performed by: Gloris Manchester   Total critical care time: 35 minutes  Critical care time was exclusive of separately billable procedures and treating other patients.  Critical care was necessary to treat or prevent imminent or life-threatening deterioration.  Critical care was time spent personally by me on the following activities: development of treatment plan with patient and/or surrogate as well as nursing, discussions with consultants, evaluation of patient's response to treatment, examination of patient, obtaining history from patient or surrogate, ordering and performing treatments and interventions, ordering and review of laboratory studies, ordering and review of radiographic studies, pulse oximetry and re-evaluation of patient's condition.        Final Clinical Impression(s) / ED Diagnoses Final diagnoses:  Diabetic ketoacidosis without coma associated with type 2 diabetes mellitus [facility])     Rx / DC Orders ED Discharge Orders     None         Gloris Manchester, MD 03/06/23 1401

## 2023-03-06 NOTE — ED Triage Notes (Signed)
Pt arrived POV from home c/o abdominal pain x1 week. PT denies any N/V.

## 2023-03-06 NOTE — ED Notes (Signed)
Family medicine at bedside.

## 2023-03-06 NOTE — ED Notes (Signed)
ED TO INPATIENT HANDOFF REPORT  ED Nurse Name and Phone #:   S Name/Age/Gender Deborah Sampson 62 y.o. female Room/Bed: 011C/011C  Code Status   Code Status: Full Code  Home/SNF/Other Home Patient oriented to: self, place, time, and situation Is this baseline? Yes   Triage Complete: Triage complete  Chief Complaint DKA (diabetic ketoacidosis) (HCC) [E11.10]  Triage Note Pt arrived POV from home c/o abdominal pain x1 week. PT denies any N/V.    Allergies Allergies  Allergen Reactions   Penicillins Rash    Level of Care/Admitting Diagnosis ED Disposition     ED Disposition  Admit   Condition  --   Comment  Hospital Area: [facility] [[id number]]  Level of Care: Progressive [102]  Admit to Progressive based on following criteria: GI, ENDOCRINE disease patients with GI bleeding, acute liver failure or pancreatitis, stable with diabetic ketoacidosis or thyrotoxicosis (hypothyroid) state.  May admit patient to [facility] or [facility] if equivalent level of care is available:: No  Covid Evaluation: Asymptomatic - no recent exposure (last 10 days) testing not required  Diagnosis: DKA (diabetic ketoacidosis) [facility]) [[id number]]  Admitting Physician: Lockie Mola [[id number]]  Attending Physician: Acquanetta Belling D [[id number]]  Certification:: I certify this patient will need inpatient services for at least 2 midnights  Expected Medical Readiness: 03/09/2023          B Medical/Surgery History Past Medical History:  Diagnosis Date   Acute appendicitis 11/21/2018   Back pain 11/27/2015   GERD (gastroesophageal reflux disease)    H. pylori infection 07/25/2019   Headache 12/15/2016   Palpitations 11/27/2015   Patient speaks only [place] 07/25/2019   Ref[employer]e health examination 11/27/2015   S/P laparoscopic appendectomy 11/21/2018   Scars, cultural scarification 11/27/2015   Past Surgical History:  Procedure Laterality Date   APPENDECTOMY     LAPAROSCOPIC  APPENDECTOMY N/A 11/21/2018   Procedure: APPENDECTOMY LAPAROSCOPIC;  Surgeon: Emelia Loron, MD;  Location: [facility] OR;  Service: General;  Laterality: N/A;     A IV Location/Drains/Wounds Patient Lines/Drains/Airways Status     Active Line/Drains/Airways     Name Placement date Placement time Site Days   Peripheral IV 03/06/23 20 G Right Antecubital 03/06/23  1223  Antecubital  less than 1   Peripheral IV 03/06/23 22 G Left Hand 03/06/23  1325  Hand  less than 1            Intake/Output Last 24 hours No intake or output data in the 24 hours ending 03/06/23 2001  Labs/Imaging Results for orders placed or performed during the hospital encounter of 03/06/23 (from the past 48 hour(s))  CBG monitoring, ED     Status: Abnormal   Collection Time: 03/06/23 11:53 AM  Result Value Ref Range   Glucose-Capillary 505 (HH) 70 - 99 mg/dL    Comment: Glucose reference range applies only to samples taken after fasting for at least 8 hours.   Comment 1 Notify RN    Comment 2 Document in Chart   Basic metabolic panel     Status: Abnormal   Collection Time: 03/06/23 12:21 PM  Result Value Ref Range   Sodium 131 (L) 135 - 145 mmol/L   Potassium 4.5 3.5 - 5.1 mmol/L   Chloride 92 (L) 98 - 111 mmol/L   CO2 10 (L) 22 - 32 mmol/L   Glucose, Bld 536 (HH) 70 - 99 mg/dL    Comment: CRITICAL RESULT CALLED TO, READ BACK BY AND VERIFIED WITH  A,STRADER RN @1325  03/06/23 E,[place] Glucose reference range applies only to samples taken after fasting for at least 8 hours.    BUN 20 6 - 20 mg/dL   Creatinine, Ser 1.61 (H) 0.44 - 1.00 mg/dL   Calcium 9.0 8.9 - 09.6 mg/dL   GFR, Estimated 42 (L) >60 mL/min    Comment: (NOTE) Calculated using the CKD-EPI Creatinine Equation (2021)    Anion gap 29 (H) 5 - 15    Comment: ELECTROLYTES REPEATED TO VERIFY Performed at [facility], [address] [place], [place] [address]   Beta-hydroxybutyric acid     Status: Abnormal   Collection Time: 03/06/23  12:21 PM  Result Value Ref Range   Beta-Hydroxybutyric Acid >8.00 (H) 0.05 - 0.27 mmol/L    Comment: RESULT CONFIRMED BY MANUAL DILUTION Performed at [facility], [address] [place] [address] [place], [place] [address]   CBC with Differential (PNL)     Status: Abnormal   Collection Time: 03/06/23 12:21 PM  Result Value Ref Range   WBC 5.3 4.0 - 10.5 K/uL   RBC 6.07 (H) 3.87 - 5.11 MIL/uL   Hemoglobin 16.3 (H) 12.0 - 15.0 g/dL   HCT 14.7 (H) 82.9 - 56.2 %   MCV 83.7 80.0 - 100.0 fL   MCH 26.9 26.0 - 34.0 pg   MCHC 32.1 30.0 - 36.0 g/dL   RDW 13.0 86.5 - 78.4 %   Platelets 289 150 - 400 K/uL   nRBC 0.0 0.0 - 0.2 %   Neutrophils Relative % 67 %   Neutro Abs 3.6 1.7 - 7.7 K/uL   Lymphocytes Relative 27 %   Lymphs Abs 1.4 0.7 - 4.0 K/uL   Monocytes Relative 4 %   Monocytes Absolute 0.2 0.1 - 1.0 K/uL   Eosinophils Relative 0 %   Eosinophils Absolute 0.0 0.0 - 0.5 K/uL   Basophils Relative 1 %   Basophils Absolute 0.0 0.0 - 0.1 K/uL   Immature Granulocytes 1 %   Abs Immature Granulocytes 0.03 0.00 - 0.07 K/uL    Comment: Performed at [facility], [address] [place], [place] [address]  Magnesium     Status: None   Collection Time: 03/06/23 12:21 PM  Result Value Ref Range   Magnesium 2.3 1.7 - 2.4 mg/dL    Comment: Performed at [facility], [address] [place], [place] [address]  Hepatic function panel     Status: Abnormal   Collection Time: 03/06/23 12:21 PM  Result Value Ref Range   Total Protein 8.5 (H) 6.5 - 8.1 g/dL   Albumin 4.0 3.5 - 5.0 g/dL   AST 18 15 - 41 U/L   ALT 25 0 - 44 U/L   Alkaline Phosphatase 106 38 - 126 U/L   Total Bilirubin 1.2 0.3 - 1.2 mg/dL   Bilirubin, Direct 0.1 0.0 - 0.2 mg/dL   Indirect Bilirubin 1.1 (H) 0.3 - 0.9 mg/dL    Comment: Performed at [facility], [address] [place], [place] [address]  Troponin I (High Sensitivity)     Status: None   Collection Time: 03/06/23 12:21 PM  Result Value Ref Range   Troponin I  (High Sensitivity) 8 <18 ng/L    Comment: (NOTE) Elevated high sensitivity troponin I (hsTnI) values and significant  changes across serial measurements may suggest ACS but many other  chronic and acute conditions are known to elevate hsTnI results.  Refer to the "Links" section for  chest pain algorithms and additional  guidance. Performed at [facility], [address], [place], [place] [address]   I-Stat Venous Blood Gas, ED     Status: Abnormal   Collection Time: 03/06/23 12:31 PM  Result Value Ref Range   pH, Ven 7.143 (LL) 7.25 - 7.43   pCO2, Ven 28.9 (L) 44 - 60 mmHg   pO2, Ven 42 32 - 45 mmHg   Bicarbonate 9.9 (L) 20.0 - 28.0 mmol/L   TCO2 11 (L) 22 - 32 mmol/L   O2 Saturation 63 %   Acid-base deficit 18.0 (H) 0.0 - 2.0 mmol/L   Sodium 130 (L) 135 - 145 mmol/L   Potassium 4.7 3.5 - 5.1 mmol/L   Calcium, Ion 1.15 1.15 - 1.40 mmol/L   HCT 52.0 (H) 36.0 - 46.0 %   Hemoglobin 17.7 (H) 12.0 - 15.0 g/dL   Sample type VENOUS    Comment NOTIFIED PHYSICIAN   I-Stat Chem 8, ED     Status: Abnormal   Collection Time: 03/06/23 12:31 PM  Result Value Ref Range   Sodium 130 (L) 135 - 145 mmol/L   Potassium 4.7 3.5 - 5.1 mmol/L   Chloride 101 98 - 111 mmol/L   BUN 22 (H) 6 - 20 mg/dL   Creatinine, Ser 6.04 0.44 - 1.00 mg/dL   Glucose, Bld 540 (HH) 70 - 99 mg/dL    Comment: Glucose reference range applies only to samples taken after fasting for at least 8 hours.   Calcium, Ion 1.16 1.15 - 1.40 mmol/L   TCO2 12 (L) 22 - 32 mmol/L   Hemoglobin 18.0 (H) 12.0 - 15.0 g/dL   HCT 98.1 (H) 19.1 - 47.8 %   Comment NOTIFIED PHYSICIAN   I-Stat Lactic Acid, ED     Status: Abnormal   Collection Time: 03/06/23 12:33 PM  Result Value Ref Range   Lactic Acid, Venous 3.1 (HH) 0.5 - 1.9 mmol/L   Comment NOTIFIED PHYSICIAN   CBG monitoring, ED     Status: Abnormal   Collection Time: 03/06/23  1:52 PM  Result Value Ref Range   Glucose-Capillary 471 (H) 70 - 99 mg/dL    Comment: Glucose  reference range applies only to samples taken after fasting for at least 8 hours.  CBG monitoring, ED     Status: Abnormal   Collection Time: 03/06/23  2:28 PM  Result Value Ref Range   Glucose-Capillary 418 (H) 70 - 99 mg/dL    Comment: Glucose reference range applies only to samples taken after fasting for at least 8 hours.  Troponin I (High Sensitivity)     Status: None   Collection Time: 03/06/23  2:45 PM  Result Value Ref Range   Troponin I (High Sensitivity) 15 <18 ng/L    Comment: (NOTE) Elevated high sensitivity troponin I (hsTnI) values and significant  changes across serial measurements may suggest ACS but many other  chronic and acute conditions are known to elevate hsTnI results.  Refer to the "Links" section for chest pain algorithms and additional  guidance. Performed at [facility], [address] [place], [place] [address]   Basic metabolic panel     Status: Abnormal   Collection Time: 03/06/23  2:45 PM  Result Value Ref Range   Sodium 129 (L) 135 - 145 mmol/L   Potassium 4.4 3.5 - 5.1 mmol/L   Chloride 98 98 - 111 mmol/L   CO2 8 (L) 22 - 32 mmol/L   Glucose,  Bld 427 (H) 70 - 99 mg/dL    Comment: Glucose reference range applies only to samples taken after fasting for at least 8 hours.   BUN 16 6 - 20 mg/dL   Creatinine, Ser 1.61 (H) 0.44 - 1.00 mg/dL   Calcium 8.7 (L) 8.9 - 10.3 mg/dL   GFR, Estimated 49 (L) >60 mL/min    Comment: (NOTE) Calculated using the CKD-EPI Creatinine Equation (2021)    Anion gap 23 (H) 5 - 15    Comment: ELECTROLYTES REPEATED TO VERIFY Performed at [facility], [address] [place], [place] [address]   I-Stat Lactic Acid, ED     Status: Abnormal   Collection Time: 03/06/23  2:55 PM  Result Value Ref Range   Lactic Acid, Venous 2.1 (HH) 0.5 - 1.9 mmol/L   Comment NOTIFIED PHYSICIAN   CBG monitoring, ED     Status: Abnormal   Collection Time: 03/06/23  3:05 PM  Result Value Ref Range   Glucose-Capillary 358 (H)  70 - 99 mg/dL    Comment: Glucose reference range applies only to samples taken after fasting for at least 8 hours.  Basic metabolic panel     Status: Abnormal   Collection Time: 03/06/23  3:57 PM  Result Value Ref Range   Sodium 136 135 - 145 mmol/L   Potassium 3.9 3.5 - 5.1 mmol/L   Chloride 104 98 - 111 mmol/L   CO2 11 (L) 22 - 32 mmol/L   Glucose, Bld 197 (H) 70 - 99 mg/dL    Comment: Glucose reference range applies only to samples taken after fasting for at least 8 hours.   BUN 18 6 - 20 mg/dL   Creatinine, Ser 5.40 (H) 0.44 - 1.00 mg/dL   Calcium 8.9 8.9 - 98.1 mg/dL   GFR, Estimated 53 (L) >60 mL/min    Comment: (NOTE) Calculated using the CKD-EPI Creatinine Equation (2021)    Anion gap 21 (H) 5 - 15    Comment: ELECTROLYTES REPEATED TO VERIFY Performed at [facility], [address], [place], [place] [address]   CBG monitoring, ED     Status: Abnormal   Collection Time: 03/06/23  4:03 PM  Result Value Ref Range   Glucose-Capillary 230 (H) 70 - 99 mg/dL    Comment: Glucose reference range applies only to samples taken after fasting for at least 8 hours.  Lipase, blood     Status: None   Collection Time: 03/06/23  4:32 PM  Result Value Ref Range   Lipase 39 11 - 51 U/L    Comment: Performed at [facility], [address], [place], [place] [address]  Beta-hydroxybutyric acid     Status: Abnormal   Collection Time: 03/06/23  4:37 PM  Result Value Ref Range   Beta-Hydroxybutyric Acid 7.99 (H) 0.05 - 0.27 mmol/L    Comment: RESULT CONFIRMED BY MANUAL DILUTION Performed at [facility], [address] [place], [place] [address]   Hemoglobin A1c     Status: Abnormal   Collection Time: 03/06/23  4:37 PM  Result Value Ref Range   Hgb A1c MFr Bld 14.3 (H) 4.8 - 5.6 %    Comment: (NOTE) Pre diabetes:          5.7%-6.4%  Diabetes:              >6.4%  Glycemic control for   <7.0% adults with diabetes    Mean Plasma Glucose 363.71 mg/dL  Comment:  Performed at [facility], [address], [place], [place] [address]  Urinalysis, Routine w reflex microscopic -Urine, Clean Catch     Status: Abnormal   Collection Time: 03/06/23  4:50 PM  Result Value Ref Range   Color, Urine STRAW (A) YELLOW   APPearance CLEAR CLEAR   Specific Gravity, Urine 1.042 (H) 1.005 - 1.030   pH 5.0 5.0 - 8.0   Glucose, UA >=500 (A) NEGATIVE mg/dL   Hgb urine dipstick SMALL (A) NEGATIVE   Bilirubin Urine NEGATIVE NEGATIVE   Ketones, ur 80 (A) NEGATIVE mg/dL   Protein, ur 30 (A) NEGATIVE mg/dL   Nitrite NEGATIVE NEGATIVE   Leukocytes,Ua NEGATIVE NEGATIVE   RBC / HPF 0-5 0 - 5 RBC/hpf   WBC, UA 6-10 0 - 5 WBC/hpf   Bacteria, UA RARE (A) NONE SEEN   Squamous Epithelial / HPF 0-5 0 - 5 /HPF   Mucus PRESENT     Comment: Performed at [facility], [address], [place], [place] [address]  CBG monitoring, ED     Status: Abnormal   Collection Time: 03/06/23  5:04 PM  Result Value Ref Range   Glucose-Capillary 152 (H) 70 - 99 mg/dL    Comment: Glucose reference range applies only to samples taken after fasting for at least 8 hours.  CBG monitoring, ED     Status: Abnormal   Collection Time: 03/06/23  5:53 PM  Result Value Ref Range   Glucose-Capillary 164 (H) 70 - 99 mg/dL    Comment: Glucose reference range applies only to samples taken after fasting for at least 8 hours.  CBG monitoring, ED     Status: Abnormal   Collection Time: 03/06/23  6:59 PM  Result Value Ref Range   Glucose-Capillary 179 (H) 70 - 99 mg/dL    Comment: Glucose reference range applies only to samples taken after fasting for at least 8 hours.   CT ABDOMEN PELVIS W CONTRAST  Result Date: 03/06/2023 CLINICAL DATA:  Nonlocalized abdominal pain. EXAM: CT ABDOMEN AND PELVIS WITH CONTRAST TECHNIQUE: Multidetector CT imaging of the abdomen and pelvis was performed using the standard protocol following bolus administration of intravenous contrast. RADIATION DOSE REDUCTION: This  exam was performed according to the departmental dose-optimization program which includes automated exposure control, adjustment of the mA and/or kV according to patient size and/or use of iterative reconstruction technique. CONTRAST:  75mL OMNIPAQUE IOHEXOL 350 MG/ML SOLN COMPARISON:  11/21/2018 FINDINGS: Lower chest: 8 mm left lower lobe pulmonary nodule evident (image 33/5). Hepatobiliary: No suspicious focal abnormality within the liver parenchyma. There is no evidence for gallstones, gallbladder wall thickening, or pericholecystic fluid. No intrahepatic or extrahepatic biliary dilation. Pancreas: No focal mass lesion. No dilatation of the main duct. No intraparenchymal cyst. No peripancreatic edema. Spleen: No splenomegaly. No suspicious focal mass lesion. Adrenals/Urinary Tract: No adrenal nodule or mass. Low-density renal lesions bilaterally are too small to characterize but similar to prior and compatible with cyst. No followup imaging is recommended. No evidence for hydroureter. The urinary bladder appears normal for the degree of distention. Stomach/Bowel: Stomach is distended with food and fluid. Duodenum is normally positioned as is the ligament of Treitz. No small bowel wall thickening. No small bowel dilatation. The terminal ileum is normal. Nonvisualization of the appendix is consistent with the reported history of appendectomy. No gross colonic mass. No colonic wall thickening. Vascular/Lymphatic: No abdominal aortic aneurysm. No abdominal aortic atherosclerotic calcification. There is no gastrohepatic or hepatoduodenal  ligament lymphadenopathy. No retroperitoneal or mesenteric lymphadenopathy. No pelvic sidewall lymphadenopathy. Reproductive: Small fibroids noted in the uterus. There is no adnexal mass. Other: No intraperitoneal free fluid. Musculoskeletal: No worrisome lytic or sclerotic osseous abnormality. IMPRESSION: 1. No acute findings in the abdomen or pelvis. Specifically, no findings to  explain the patient's history of abdominal pain. 2. 8 mm left lower lobe pulmonary nodule. Non-contrast chest CT at 6-12 months is recommended. If the nodule is stable at time of repeat CT, then future CT at 18-24 months (from today's scan) is considered optional for low-risk patients, but is recommended for high-risk patients. This recommendation follows the consensus statement: Guidelines for Management of Incidental Pulmonary Nodules Detected on CT Images: From the Fleischner Society 2017; Radiology 2017; 284:228-243. Electronically Signed   By: Kennith Center M.D.   On: 03/06/2023 13:43    Pending Labs Unresulted Labs (From admission, onward)     Start     Ordered   03/06/23 1818  HIV Antibody (routine testing w rflx)  Once,   R        03/06/23 1818   03/06/23 1715  C-peptide  Once,   URGENT        03/06/23 1714   03/06/23 1640  Wet prep, genital  Once,   URGENT        03/06/23 1640   03/06/23 1157  Basic metabolic panel  (Diabetes Ketoacidosis (DKA))  STAT Now then every 4 hours ,   STAT      03/06/23 1158   03/06/23 1157  Beta-hydroxybutyric acid  (Diabetes Ketoacidosis (DKA))  Now then every 8 hours,   STAT (with URGENT occurrences)      03/06/23 1158            Vitals/Pain Today's Vitals   03/06/23 1757 03/06/23 1800 03/06/23 1815 03/06/23 1845  BP:  121/71 122/75 129/73  Pulse:  99 (!) 104 (!) 112  Resp:      Temp: 98.1 F (36.7 C)     TempSrc: Oral     SpO2:  100% 98% 100%  Weight:      Height:      PainSc:        Isolation Precautions No active isolations  Medications Medications  insulin regular, human (MYXREDLIN) 100 units/ 100 mL infusion (4.2 Units/hr Intravenous Rate/Dose Change 03/06/23 1901)  lactated ringers infusion (has no administration in time range)  dextrose 5 % in lactated ringers infusion ( Intravenous New Bag/Given 03/06/23 1715)  dextrose 50 % solution 0-50 mL (has no administration in time range)  amLODipine (NORVASC) tablet 5 mg (has no  administration in time range)  lactated ringers bolus 1,000 mL (0 mLs Intravenous Hold 03/06/23 1807)  enoxaparin (LOVENOX) injection 40 mg (has no administration in time range)  polyethylene glycol (MIRALAX / GLYCOLAX) packet 17 g (has no administration in time range)  lactated ringers bolus 1,500 mL (0 mLs Intravenous Stopped 03/06/23 1330)  metoCLOPramide (REGLAN) injection 10 mg (10 mg Intravenous Given 03/06/23 1316)  fentaNYL (SUBLIMAZE) injection 50 mcg (50 mcg Intravenous Given 03/06/23 1316)  potassium chloride 10 mEq in 100 mL IVPB (0 mEq Intravenous Stopped 03/06/23 1600)  0.9 %  sodium chloride infusion (0 mLs Intravenous Stopped 03/06/23 1717)  iohexol (OMNIPAQUE) 350 MG/ML injection 75 mL (75 mLs Intravenous Contrast Given 03/06/23 1336)    Mobility walks     Focused Assessments    R Recommendations: See Admitting Provider Note  Report given to:   Additional Notes:

## 2023-03-06 NOTE — ED Provider Triage Note (Signed)
Emergency Medicine Provider Triage Evaluation Note  Deborah Sampson , a 62 y.o. female  was evaluated in triage.  Pt complains of weakness, weight loss, excessive urination, excessive thirst, vaginal rash which is itchy that will not heal with medications, stomach pain and vomiting worse over the last week.  No history of diabetes.  Review of Systems  Positive: Dehydration Negative: Fever  Physical Exam  BP 128/87 (BP Location: Right Arm)   Pulse (!) 110   Temp 98.6 F (37 C) (Oral)   Resp 15   LMP 09/07/2018   SpO2 98%  Gen:   Awake, no distress   Resp:  Normal effort  MSK:   Moves extremities without difficulty  Other:  Right oral mucosa  Medical Decision Making  Medically screening exam initiated at 11:58 AM.  Appropriate orders placed.  Deborah Sampson was informed that the remainder of the evaluation will be completed by another provider, this initial triage assessment does not replace that evaluation, and the importance of remaining in the ED until their evaluation is complete.  Suspect new onset DKA, sugar greater than 500.  Patient will go directly to room 11 after conversation with charge nurse.   Arthor Captain, PA-C 03/06/23 1200

## 2023-03-06 NOTE — H&P (Cosign Needed Addendum)
Hospital Admission History and Physical Service Pager: [phone]  Patient name: Deborah Sampson Medical record number: [id number] Date of Birth: [birth year] Age: 62 y.o. Gender: female  Primary Care Provider: Billey Co, MD Consultants: None Code Status: Full Code - confirmed  Preferred Emergency Contact:  Daughter:  [phone]  Contact Information     Name Relation Home Work Mobile   Ntagwabira,Louis Spouse [phone]  [phone]   Regina Eck   [phone]      Other Contacts   None on File    Spoke to patient and daughter via virtual [place] language interpreter  Chief Complaint: Abdominal pain, vaginal pain  Assessment and Plan: Deborah Sampson is a 62 y.o. female presenting with polyuria, polydipsia, and fatigue found to have DKA. Differential for etiology of DKA includes type II diabetes vs MODI vs ischemia vs neuroendocrine tumor. Patient is postmenopausal so pregnancy is not on the differential. No acute findings on CTAP, making ischemia and neoplastic processes less likely. Abdominal pain most likely due to DKA, CTAP did not show necrotizing pancreatitis but cannot rule this out yet. Vaginal itching most likely caused by recent bacterial vaginosis infection that pt did not complete antibiotic treatment for. Pt has hx of postmenopausal vaginal bleeding, denies recent bleeding.  Assessment & Plan Diabetic ketoacidosis without coma associated with type 2 diabetes mellitus (HCC) Hx of polyuria, polydipsia, generalized fatigue.  Blood glucose 505 on admission with anion gap. A1c 14.3. s/p 2.5L LR bolus in ED, started on regular insulin per EndoTool. BG improved to 152. -Continue regular insulin per EndoTool -LR at 176mL/hr -BMP q4hr -beta-hydroxybutyric acid q8hr -C-peptide  LR bolus  - transition to subcutaneous insulin after anion gap is closed x 2  Vaginal itching Recent BV infection, unclear if patient completed antibiotic  treatment -wet prep Generalized abdominal pain Abdominal pain most likely secondary to DKA. CTAP without any masses or necrosis near pancreas. Pain not indicative of ischemia. Does have uterine fibroids and had BV that is not fully treated given patient could not tolerate flagyl.  - F/u lipase  - F/u wet prep  - Monitor symptoms    FEN/GI: NPO while on endotool  VTE Prophylaxis:   Disposition: Admit to progressive   History of Present Illness:  Deborah Sampson is a 62 y.o. female presenting with abdominal pain.   Patient says that she had abdominal pain and vaginal pain today. She said she had this for a week. She said she felt hot in her body but did not have a fever. She did not check temperature at home. She additionally felt weak.   She has been vomiting since Thursday (8/15). Says she is constipated. Denies hematochezia. She has been having headaches. Her and her daughter report that she has been very thirsty. She has been voiding every time she drinks something.  Describes her vaginal irritation as itchiness that has been lasting about 2 weeks.    Says she saw primary care doctor and she says these symptoms developed or worsened after this visit.  Says a lot of her symptoms started in August.   Daughter is her neighbor. She has noticed that her mother has been dizzy, thirsty, and urinating a lot. She said the doctor told her she had high blood pressure. She said that this is new for her mother.   She has been losing weight recently, Says she has lost 5 kg recently. Says she has been very sweaty at night not been able to  cover herself or sleep since June.   In the ED, patient received 2.5L fluid bolus and was started on maintenance fluids. She was started on insulin using endotool and was given reglan for nausea and fentanyl for pain.   Review Of Systems: Per HPI with the following additions: none  Pertinent Past Medical History: HTN H/o refugee  Uterine fibroids  Remainder  reviewed in history tab.   Pertinent Past Surgical History:  Remainder reviewed in history tab.   Pertinent Social History: Tobacco use: No Alcohol use: No  Other Substance use: None  Lives with her husband   Pertinent Family History: No hx of diabetes or cancer  Remainder reviewed in history tab.   Important Outpatient Medications: Amlodipine  Hydrochlorothiazide Omeprazole  Pepcid  Remainder reviewed in medication history.   Objective: BP 121/71   Pulse 99   Temp 98.1 F (36.7 C) (Oral)   Resp 17   Ht 5\' 6"  (1.676 m)   Wt 74.8 kg   LMP 09/07/2018   SpO2 100%   BMI 26.63 kg/m  Exam: General: Ill appearing woman, lying in bed, uncomfortable  Eyes: anicteric, EOM intact Cardiovascular: tachycardic, regular rhythm, normal S1/S2, no murmurs, rubs, gallops Respiratory: CTAB, normal WOB Gastrointestinal: diminished bowel sounds, mild tenderness to palpation at L flank and LLQ Derm: warm and dry, cap refill <2 sec Neuro: moving all 4 extremities spontaneously Psych: appropriate mood and affect  Labs:  CBC BMET  Recent Labs  Lab 03/06/23 1221 03/06/23 1231  WBC 5.3  --   HGB 16.3* 17.7*  18.0*  HCT 50.8* 52.0*  53.0*  PLT 289  --    Recent Labs  Lab 03/06/23 1557  NA 136  K 3.9  CL 104  CO2 11*  BUN 18  CREATININE 1.17*  GLUCOSE 197*  CALCIUM 8.9    Pertinent additional labs VBG: 7.143, CO2 28.9, Bicarb 9.9, Acid base 18.0   Lactic acid 3.1 > 2.1  A1c 14.3  EKG: sinus rhythm   Imaging Studies Performed: CTAP No acute findings in abdomen or pelvis 2.8 mm LLL pulmonary nodule.  Upper Level Attestation I have seen and examined the patient with the resident Dr. Dolan Amen. I agree with the history, physical, and assessment above with any necessary edits.    Lockie Mola, MD 03/06/2023, 6:16 PM PGY-2, [facility]  FPTS Intern pager: [phone], text pages welcome Secure chat group [facility] Teaching Service

## 2023-03-06 NOTE — Progress Notes (Signed)
FMTS Brief Progress Note  S: Seen at bedside with Dr. Claudean Severance with interpreter via Ipad for [place].  Patient has noticed improvement in her abdominal pain Wondering about why she is NPO Asking about her sugar levels.    O: BP 129/73   Pulse (!) 112   Temp 98.1 F (36.7 C) (Oral)   Resp 17   Ht 5\' 6"  (1.676 m)   Wt 74.8 kg   LMP 09/07/2018   SpO2 100%   BMI 26.63 kg/m   Performed by Dr. Claudean Severance  General: NAD, pleasant, able to participate in exam Card: RRR. No m/g/r Abd: Nondistended, soft  Respiratory: No respiratory distress Skin: warm and dry, no rashes noted Psych: Normal affect and mood   A/P: DM2 New diagnosis  Dr. Claudean Severance explained DM2 to the patient and the course moving forward in the hospital.  q4hrBMP Endotool until AG negative x2 Monitor lytes and CBGs Abdominal pain improving, hold on repeat imaging, likely 2/2 DKA  Alfredo Martinez, MD 03/06/2023, 8:25 PM PGY-3, [facility] Night Resident  Please page [phone] with questions.

## 2023-03-06 NOTE — Assessment & Plan Note (Addendum)
Abdominal pain most likely secondary to DKA. CTAP without any masses or necrosis near pancreas. Pain not indicative of ischemia. Does have uterine fibroids and had BV that is not fully treated given patient could not tolerate flagyl.  - F/u lipase  - F/u wet prep  - Monitor symptoms

## 2023-03-06 NOTE — ED Notes (Signed)
This RN unsuccessful at 3rd IV access. Attending notified.

## 2023-03-06 NOTE — Hospital Course (Addendum)
Zeniyah Zora is a 62 y.o.female with a history of HTN, PUD who was admitted to the [facility] Teaching [facility] at [facility] for DKA. Her hospital course is detailed below:  DKA I T2DM   Other chronic conditions were medically managed with home medications and formulary alternatives as necessary (***)  PCP Follow-up Recommendations: 2. 8 mm left lower lobe pulmonary nodule. Non-contrast chest CT at 6-12 months is recommended Transvaginal ultrasound

## 2023-03-07 DIAGNOSIS — E1110 Type 2 diabetes mellitus with ketoacidosis without coma: Secondary | ICD-10-CM | POA: Diagnosis not present

## 2023-03-07 DIAGNOSIS — N179 Acute kidney failure, unspecified: Secondary | ICD-10-CM | POA: Diagnosis present

## 2023-03-07 DIAGNOSIS — E876 Hypokalemia: Secondary | ICD-10-CM | POA: Diagnosis not present

## 2023-03-07 LAB — MAGNESIUM: Magnesium: 1.9 mg/dL (ref 1.7–2.4)

## 2023-03-07 LAB — BASIC METABOLIC PANEL
Anion gap: 11 (ref 5–15)
Anion gap: 11 (ref 5–15)
Anion gap: 10 (ref 5–15)
BUN: 13 mg/dL (ref 6–20)
BUN: 13 mg/dL (ref 6–20)
BUN: 15 mg/dL (ref 6–20)
CO2: 18 mmol/L — ABNORMAL LOW (ref 22–32)
CO2: 19 mmol/L — ABNORMAL LOW (ref 22–32)
CO2: 17 mmol/L — ABNORMAL LOW (ref 22–32)
Calcium: 8.5 mg/dL — ABNORMAL LOW (ref 8.9–10.3)
Calcium: 8.3 mg/dL — ABNORMAL LOW (ref 8.9–10.3)
Calcium: 8 mg/dL — ABNORMAL LOW (ref 8.9–10.3)
Chloride: 105 mmol/L (ref 98–111)
Chloride: 105 mmol/L (ref 98–111)
Chloride: 104 mmol/L (ref 98–111)
Creatinine, Ser: 0.76 mg/dL (ref 0.44–1.00)
Creatinine, Ser: 0.74 mg/dL (ref 0.44–1.00)
Creatinine, Ser: 0.87 mg/dL (ref 0.44–1.00)
GFR, Estimated: >60 mL/min (ref >60)
GFR, Estimated: >60 mL/min (ref >60)
GFR, Estimated: >60 mL/min (ref >60)
Glucose, Bld: 147 mg/dL — ABNORMAL HIGH (ref 70–99)
Glucose, Bld: 145 mg/dL — ABNORMAL HIGH (ref 70–99)
Glucose, Bld: 355 mg/dL — ABNORMAL HIGH (ref 70–99)
Potassium: 2.8 mmol/L — ABNORMAL LOW (ref 3.5–5.1)
Potassium: 3.4 mmol/L — ABNORMAL LOW (ref 3.5–5.1)
Potassium: 4 mmol/L (ref 3.5–5.1)
Sodium: 134 mmol/L — ABNORMAL LOW (ref 135–145)
Sodium: 135 mmol/L (ref 135–145)
Sodium: 131 mmol/L — ABNORMAL LOW (ref 135–145)

## 2023-03-07 LAB — GLUCOSE, CAPILLARY
Glucose-Capillary: 118 mg/dL — ABNORMAL HIGH (ref 70–99)
Glucose-Capillary: 131 mg/dL — ABNORMAL HIGH (ref 70–99)
Glucose-Capillary: 144 mg/dL — ABNORMAL HIGH (ref 70–99)
Glucose-Capillary: 146 mg/dL — ABNORMAL HIGH (ref 70–99)
Glucose-Capillary: 147 mg/dL — ABNORMAL HIGH (ref 70–99)
Glucose-Capillary: 167 mg/dL — ABNORMAL HIGH (ref 70–99)
Glucose-Capillary: 170 mg/dL — ABNORMAL HIGH (ref 70–99)
Glucose-Capillary: 183 mg/dL — ABNORMAL HIGH (ref 70–99)
Glucose-Capillary: 225 mg/dL — ABNORMAL HIGH (ref 70–99)
Glucose-Capillary: 247 mg/dL — ABNORMAL HIGH (ref 70–99)
Glucose-Capillary: 263 mg/dL — ABNORMAL HIGH (ref 70–99)
Glucose-Capillary: 312 mg/dL — ABNORMAL HIGH (ref 70–99)
Glucose-Capillary: 317 mg/dL — ABNORMAL HIGH (ref 70–99)
Glucose-Capillary: 330 mg/dL — ABNORMAL HIGH (ref 70–99)
Glucose-Capillary: 333 mg/dL — ABNORMAL HIGH (ref 70–99)

## 2023-03-07 LAB — BETA-HYDROXYBUTYRIC ACID: Beta-Hydroxybutyric Acid: 1.71 mmol/L — ABNORMAL HIGH (ref 0.05–0.27)

## 2023-03-07 MED ORDER — INSULIN ASPART 100 UNIT/ML IJ SOLN
0.0000 [IU] | Freq: Every day | INTRAMUSCULAR | Status: DC
  Administered 2023-03-07: 3 [IU] via SUBCUTANEOUS

## 2023-03-07 MED ORDER — ACETAMINOPHEN 500 MG PO TABS
1000.0000 mg | ORAL_TABLET | Freq: Four times a day (QID) | ORAL | Status: DC | PRN
  Administered 2023-03-07: 1000 mg via ORAL
  Filled 2023-03-07: qty 2

## 2023-03-07 MED ORDER — INSULIN ASPART 100 UNIT/ML IJ SOLN
0.0000 [IU] | Freq: Three times a day (TID) | INTRAMUSCULAR | Status: DC
  Administered 2023-03-07: 7 [IU] via SUBCUTANEOUS
  Administered 2023-03-07: 1 [IU] via SUBCUTANEOUS
  Administered 2023-03-07: 7 [IU] via SUBCUTANEOUS
  Administered 2023-03-08 (×3): 3 [IU] via SUBCUTANEOUS
  Administered 2023-03-09: 7 [IU] via SUBCUTANEOUS
  Administered 2023-03-09: 9 [IU] via SUBCUTANEOUS
  Administered 2023-03-09: 7 [IU] via SUBCUTANEOUS

## 2023-03-07 MED ORDER — LACTATED RINGERS IV SOLN: INTRAVENOUS | Status: DC

## 2023-03-07 MED ORDER — FLUCONAZOLE 150 MG PO TABS
150.0000 mg | ORAL_TABLET | Freq: Once | ORAL | Status: AC
  Administered 2023-03-08: 150 mg via ORAL
  Filled 2023-03-07: qty 1

## 2023-03-07 MED ORDER — INSULIN GLARGINE-YFGN 100 UNIT/ML ~~LOC~~ SOLN
16.0000 [IU] | SUBCUTANEOUS | Status: DC
  Administered 2023-03-08: 16 [IU] via SUBCUTANEOUS
  Filled 2023-03-07: qty 0.16

## 2023-03-07 MED ORDER — INSULIN GLARGINE-YFGN 100 UNIT/ML ~~LOC~~ SOLN
8.0000 [IU] | SUBCUTANEOUS | Status: DC
  Administered 2023-03-07: 8 [IU] via SUBCUTANEOUS
  Filled 2023-03-07 (×2): qty 0.08

## 2023-03-07 MED ORDER — POTASSIUM CHLORIDE 10 MEQ/100ML IV SOLN
10.0000 meq | INTRAVENOUS | Status: AC
  Administered 2023-03-07 (×4): 10 meq via INTRAVENOUS
  Filled 2023-03-07 (×4): qty 100

## 2023-03-07 MED ORDER — INSULIN ASPART 100 UNIT/ML IJ SOLN
3.0000 [IU] | Freq: Three times a day (TID) | INTRAMUSCULAR | Status: DC
  Administered 2023-03-07 – 2023-03-09 (×9): 3 [IU] via SUBCUTANEOUS

## 2023-03-07 MED ORDER — INSULIN GLARGINE-YFGN 100 UNIT/ML ~~LOC~~ SOLN: 8.0000 [IU] | Freq: Every day | SUBCUTANEOUS | Status: DC

## 2023-03-07 MED ORDER — POTASSIUM CHLORIDE CRYS ER 20 MEQ PO TBCR
40.0000 meq | EXTENDED_RELEASE_TABLET | Freq: Two times a day (BID) | ORAL | Status: AC
  Administered 2023-03-07 (×2): 40 meq via ORAL
  Filled 2023-03-07 (×2): qty 2

## 2023-03-07 NOTE — Plan of Care (Signed)

## 2023-03-07 NOTE — Plan of Care (Signed)

## 2023-03-07 NOTE — Assessment & Plan Note (Addendum)
New onset T2DM.  Gap closed, transitioned off endotool. A1c 14.3. - Semglee 8u at bedtime, novolog 3u TID w/ meals; titrate based on insulin need when restarting diet -Sensitive SSI, CBG 4 times daily, before meals and at bedtime -LR at 126mL/hr, discontinue when able to eat -Diabetes coordinator for education -Discontinued Endo tool orders

## 2023-03-07 NOTE — Assessment & Plan Note (Addendum)
Improved.  Does have uterine fibroids and had BV that is not fully treated given patient could not tolerate flagyl.  -Monitor symptoms -Restart Flagyl when better able to tolerate diet

## 2023-03-07 NOTE — Progress Notes (Signed)
Daily Progress Note Intern Pager: [phone]  Patient name: Deborah Sampson Medical record number: [id number] Date of birth: 04-Apr-1961 Age: 62 y.o. Gender: female  Primary Care Provider: Billey Co, MD Consultants: None Code Status: Full code  Pt Overview and Major Events to Date:  8/17: Admitted, on Endo tool 8/18: Transitioned off Endo tool  Assessment and Plan: Deborah Sampson is 62 y.o. female who presented with polyuria, polydipsia, and fatigue found to be in DKA.  This is a new diagnosis of type 2 diabetes.  She has also had abdominal pain which may be related to this noted new diagnosis however there is concern for accompanying reflux and BV that may complicate picture. Pertinent PMH/PSH includes HTN, uterine fibroids.  Assessment & Plan DKA (diabetic ketoacidosis) (HCC) New onset T2DM.  Gap closed, transitioned off endotool. A1c 14.3. - Semglee 8u at bedtime, novolog 3u TID w/ meals; titrate based on insulin need when restarting diet -Sensitive SSI, CBG 4 times daily, before meals and at bedtime -LR at 165mL/hr, discontinue when able to eat -Diabetes coordinator for education -Discontinued Endo tool orders Abdominal pain Improved.  Does have uterine fibroids and had BV that is not fully treated given patient could not tolerate flagyl.  -Monitor symptoms -Restart Flagyl when better able to tolerate diet Hypokalemia K2.8.  IV K10 mEq x 4 currently.  Recheck on BMP with K3.4. -Klor-Con tablet x 2 -AM BMP, Mg  Chronic and Stable Problems:  HTN: Amlodipine 5 mg daily, hold HCTZ GERD?   FEN/GI: LR at 100 mL/h, carb modified diet PPx: Lovenox Dispo:Home in 2-3 days. Barriers include diabetes education and medication regimen and new onset diabetes in refugee patient..   Subjective:  She is hoping she will get better.  We thoroughly discussed today that diabetes involves blood sugar control and in the meantime we will focus on education, returning her to a  regular diet and managing her blood sugar with insulin.  In the future, she may not need insulin and may be able to transition to other medications however given her presentation, insulin is the safest route for now to ensure she does not need to return to the hospital.  Objective: Temp:  [97.6 F (36.4 C)-98.6 F (37 C)] 97.9 F (36.6 C) (08/18 1029) Pulse Rate:  [83-112] 83 (08/18 1029) Resp:  [13-24] 19 (08/18 1029) BP: (100-151)/(58-104) 120/70 (08/18 1029) SpO2:  [95 %-100 %] 99 % (08/18 1029) Weight:  [73.9 kg-74.8 kg] 73.9 kg (08/17 2253) Physical Exam: General: Well-appearing, NAD, laying in bed with niece at bedside Cardiovascular: RRR, no murmurs auscultated Respiratory: CTAB, normal WOB Abdomen: Soft, nontender, normoactive bowel sounds  Laboratory: Most recent CBC Lab Results  Component Value Date   WBC 5.3 03/06/2023   HGB 17.7 (H) 03/06/2023   HGB 18.0 (H) 03/06/2023   HCT 52.0 (H) 03/06/2023   HCT 53.0 (H) 03/06/2023   MCV 83.7 03/06/2023   PLT 289 03/06/2023   Most recent BMP    Latest Ref Rng & Units 03/07/2023    7:15 AM  BMP  Glucose 70 - 99 mg/dL 147   BUN 6 - 20 mg/dL 13   Creatinine 8.29 - 1.00 mg/dL 5.62   Sodium 130 - 865 mmol/L 135   Potassium 3.5 - 5.1 mmol/L 3.4   Chloride 98 - 111 mmol/L 105   CO2 22 - 32 mmol/L 19   Calcium 8.9 - 10.3 mg/dL 8.3    Magnesium 1.9 CBG (last 3)  Recent  Labs    03/07/23 0724 03/07/23 0923 03/07/23 1032  GLUCAP 147* 225* 247*     Imaging/Diagnostic Tests: No new imaging results Shelby Mattocks, DO 03/07/2023, 11:15 AM  PGY-3, [facility] FPTS Intern pager: [phone], text pages welcome Secure chat group [employer] [facility] Teaching Service

## 2023-03-07 NOTE — Assessment & Plan Note (Addendum)
K2.8.  IV K10 mEq x 4 currently.  Recheck on BMP with K3.4. -Klor-Con tablet x 2 -AM BMP, Mg

## 2023-03-08 ENCOUNTER — Other Ambulatory Visit (HOSPITAL_COMMUNITY): Payer: Self-pay

## 2023-03-08 DIAGNOSIS — N898 Other specified noninflammatory disorders of vagina: Secondary | ICD-10-CM | POA: Insufficient documentation

## 2023-03-08 DIAGNOSIS — E1110 Type 2 diabetes mellitus with ketoacidosis without coma: Secondary | ICD-10-CM | POA: Diagnosis not present

## 2023-03-08 LAB — MAGNESIUM: Magnesium: 1.9 mg/dL (ref 1.7–2.4)

## 2023-03-08 LAB — BASIC METABOLIC PANEL
Anion gap: 7 (ref 5–15)
BUN: 7 mg/dL (ref 6–20)
CO2: 22 mmol/L (ref 22–32)
Calcium: 8.1 mg/dL — ABNORMAL LOW (ref 8.9–10.3)
Chloride: 105 mmol/L (ref 98–111)
Creatinine, Ser: 0.65 mg/dL (ref 0.44–1.00)
GFR, Estimated: >60 mL/min (ref >60)
Glucose, Bld: 275 mg/dL — ABNORMAL HIGH (ref 70–99)
Potassium: 3.4 mmol/L — ABNORMAL LOW (ref 3.5–5.1)
Sodium: 134 mmol/L — ABNORMAL LOW (ref 135–145)

## 2023-03-08 LAB — GLUCOSE, CAPILLARY
Glucose-Capillary: 250 mg/dL — ABNORMAL HIGH (ref 70–99)
Glucose-Capillary: 238 mg/dL — ABNORMAL HIGH (ref 70–99)
Glucose-Capillary: 241 mg/dL — ABNORMAL HIGH (ref 70–99)
Glucose-Capillary: 201 mg/dL — ABNORMAL HIGH (ref 70–99)

## 2023-03-08 LAB — C-PEPTIDE: C-Peptide: 2 ng/mL (ref 1.1–4.4)

## 2023-03-08 MED ORDER — LIVING WELL WITH DIABETES BOOK
Freq: Once | Status: AC
  Filled 2023-03-08: qty 1

## 2023-03-08 MED ORDER — INSULIN ASPART 100 UNIT/ML IJ SOLN
10.0000 [IU] | Freq: Once | INTRAMUSCULAR | Status: AC
  Administered 2023-03-08: 10 [IU] via SUBCUTANEOUS

## 2023-03-08 MED ORDER — POTASSIUM CHLORIDE 20 MEQ PO PACK
40.0000 meq | PACK | Freq: Once | ORAL | Status: AC
  Administered 2023-03-08: 40 meq via ORAL
  Filled 2023-03-08: qty 2

## 2023-03-08 MED ORDER — INSULIN GLARGINE-YFGN 100 UNIT/ML ~~LOC~~ SOLN
8.0000 [IU] | Freq: Once | SUBCUTANEOUS | Status: AC
  Administered 2023-03-08: 8 [IU] via SUBCUTANEOUS
  Filled 2023-03-08: qty 0.08

## 2023-03-08 MED ORDER — INSULIN STARTER KIT- PEN NEEDLES (ENGLISH)
1.0000 | Freq: Once | Status: AC
  Administered 2023-03-08: 1
  Filled 2023-03-08: qty 1

## 2023-03-08 MED ORDER — INSULIN GLARGINE-YFGN 100 UNIT/ML ~~LOC~~ SOLN
24.0000 [IU] | SUBCUTANEOUS | Status: DC
  Filled 2023-03-08: qty 0.24

## 2023-03-08 MED ORDER — POTASSIUM CHLORIDE CRYS ER 20 MEQ PO TBCR
40.0000 meq | EXTENDED_RELEASE_TABLET | Freq: Once | ORAL | Status: AC
  Administered 2023-03-08: 40 meq via ORAL
  Filled 2023-03-08: qty 2

## 2023-03-08 NOTE — Inpatient Diabetes Management (Signed)
Inpatient Diabetes Program Recommendations  AACE/ADA: New Consensus Statement on Inpatient Glycemic Control (2015)  Target Ranges:  Prepandial:   less than 140 mg/dL      Peak postprandial:   less than 180 mg/dL (1-2 hours)      Critically ill patients:  140 - 180 mg/dL   Lab Results  Component Value Date   GLUCAP 250 (H) 03/08/2023   HGBA1C 14.3 (H) 03/06/2023    Review of Glycemic Control  Diabetes history: New-onset DM Outpatient Diabetes medications: None Current orders for Inpatient glycemic control: Semglee 16 units Q24H, Novolog 0-9 units TID with meals +3 units TID  HgbA1C - 14.3%   Inpatient Diabetes Program Recommendations:    Consider Novolin 70/30 15 units at discharge   Used interpreter services  Spoke with patient about new diabetes diagnosis.  Discussed A1C results (14.3%) and explained what an A1C is and informed patient that his current A1C indicates an average glucose of 365 mg/dl over the past 2-3 months. Discussed basic pathophysiology of DM Type 2, basic home care, importance of checking CBGs and maintaining good CBG control to prevent long-term and short-term complications. Reviewed glucose and A1C goals and explained that patient will need to continue to  Reviewed signs and symptoms of hyperglycemia and hypoglycemia along with treatment for both. Discussed impact of nutrition, exercise, stress, sickness, and medications on diabetes control. Pt states she does get a little exercise by walking. Pt says she eats fruit, vegetables at each meal, also eats meat. Ordered Living Well with diabetes booklet and encouraged patient to read through entire book. (Nephew can read [place]). Pt works second shift and eats 2 meals/day. We discussed if pt was discharged on 70/30 insulin, she would take before lunch which is 12 noon and before dinner at 8 pm. Will also need to check blood sugar at various other times such as early morning or before bedtime. Will need to f/u with PCP  for diabetes management. Discussed hypoglycemia s/s and treatment.  Educated patient and sister on insulin pen use at home. Reviewed contents of insulin flexpen starter kit. Reviewed all steps if insulin pen including attachment of needle, 2-unit air shot, dialing up dose, giving injection, removing needle, disposal of sharps, storage of unused insulin, disposal of insulin etc. Patient able to provide successful return demonstration. Also reviewed troubleshooting with insulin pen. MD to give patient Rxs for insulin pens and insulin pen needles.  Patient verbalized understanding of information discussed and he states that he has no further questions at this time related to diabetes.   RNs to provide ongoing basic DM education at bedside with this patient and engage patient to actively check blood glucose and administer insulin injections.   Pt seems a little overwhelmed with all the new information about diabetes. Seems to have good support.  Continue to follow.   Thank you. Ailene Ards, RD, LDN, [facility] Inpatient Diabetes Coordinator [phone]

## 2023-03-08 NOTE — TOC Initial Note (Signed)
Transition of Care [facility]) - Initial/Assessment Note    Patient Details  Name: Deborah Sampson MRN: [id number] Date of Birth: [birth year]  Transition of Care [facility]) CM/SW Contact:    Ronny Bacon, RN Phone Number: 03/08/2023, 4:03 PM  Clinical Narrative:  Spoke with patient and family at bedside. Patient is a permanent resident and has BCBS coverage from her job at [employer] Patient speaks [place] but has son that speaks [place].               Expected Discharge Plan: Home/Self Care Barriers to Discharge: Continued Medical Work up   Patient Goals and CMS Choice Patient states their goals for this hospitalization and ongoing recovery are:: to go home          Expected Discharge Plan and Services       Living arrangements for the past 2 months: Apartment                                      Prior Living Arrangements/Services Living arrangements for the past 2 months: Apartment   Patient language and need for interpreter reviewed:: Yes (Kinyarwanda) Do you feel safe going back to the place where you live?: Yes      Need for Family Participation in Patient Care: No (Comment) Care giver support system in place?: Yes (comment)   Criminal Activity/Legal Involvement Pertinent to Current Situation/Hospitalization: No - Comment as needed  Activities of Daily Living Home Assistive Devices/Equipment: None ADL Screening (condition at time of admission) Patient's cognitive ability adequate to safely complete daily activities?: Yes Is the patient deaf or have difficulty hearing?: No Does the patient have difficulty seeing, even when wearing glasses/contacts?: No Does the patient have difficulty concentrating, remembering, or making decisions?: No Patient able to express need for assistance with ADLs?: Yes Does the patient have difficulty dressing or bathing?: Yes Independently performs ADLs?: No Does the patient have difficulty walking or climbing stairs?:  Yes Weakness of Legs: Both Weakness of Arms/Hands: Both  Permission Sought/Granted                  Emotional Assessment Appearance:: Appears older than stated age Attitude/Demeanor/Rapport: Engaged Affect (typically observed): Appropriate Orientation: : Oriented to Self, Oriented to Place, Oriented to  Time, Oriented to Situation Alcohol / Substance Use: Not Applicable Psych Involvement: No (comment)  Admission diagnosis:  Vaginal itching [N89.8] Generalized abdominal pain [R10.84] DKA (diabetic ketoacidosis) (HCC) [E11.10] Diabetic ketoacidosis without coma associated with type 2 diabetes mellitus (HCC) [E11.10] Patient Active Problem List   Diagnosis Date Noted   Vaginal irritation 03/08/2023   Hypokalemia 03/07/2023   DKA (diabetic ketoacidosis) (HCC) 03/06/2023   Bacterial vaginosis 02/25/2023   Vaginal yeast infection 02/25/2023   Essential hypertension 01/08/2023   PUD (peptic ulcer disease) 08/10/2019   Postmenopausal bleeding 07/25/2019     07/25/2019   Fibroids 07/25/2019   Gastroesophageal reflux disease 03/08/2019   Chronic constipation 01/24/2016   Abdominal pain 11/27/2015   PCP:  Billey Co, MD Pharmacy:   [facility] [phone] Ginette Otto, [place] - [address] AT [facility] [address] [place] [address] Phone: [phone] Fax: [phone]  [id number] - [place], [place] - [address] AT [facility] [address] [place] [address] Phone: [phone] Fax: [phone]  [facility] - [place], [place] -  [address] [facility] [address] [place] [address] Phone: [phone] Fax: [phone]     Social Determinants of Health (SDOH) Social History: SDOH Screenings   Food Insecurity: No Food Insecurity (03/07/2023)  Housing: Low Risk  (03/07/2023)  Transportation Needs: No Transportation Needs (03/07/2023)   Utilities: Not At Risk (03/07/2023)  Depression (PHQ2-9): Low Risk  (02/12/2023)  Tobacco Use: Medium Risk (03/06/2023)   SDOH Interventions:     Readmission Risk Interventions     No data to display

## 2023-03-08 NOTE — Assessment & Plan Note (Addendum)
Improved.  Does have uterine fibroids and had BV that is not fully treated given patient could not tolerate flagyl.  -Monitor symptoms

## 2023-03-08 NOTE — Plan of Care (Signed)
  Problem: Coping: Goal: Ability to adjust to condition or change in health will improve Outcome: Progressing   Problem: Skin Integrity: Goal: Risk for impaired skin integrity will decrease Outcome: Progressing   Problem: Cardiac: Goal: Ability to maintain an adequate cardiac output will improve Outcome: Progressing   Problem: Nutritional: Goal: Maintenance of adequate nutrition will improve Outcome: Progressing   Problem: Respiratory: Goal: Will regain and/or maintain adequate ventilation Outcome: Progressing   Problem: Urinary Elimination: Goal: Ability to achieve and maintain adequate renal perfusion and functioning will improve Outcome: Progressing

## 2023-03-08 NOTE — Assessment & Plan Note (Addendum)
New onset T2DM.  Gap closed, transitioned off endotool. A1c 14.3. Got 43 units short acting and 8 units long acting over the past 24 hours. AM BG 250. C-peptide WNL. - Semglee increased to 24 units today - Novolog 3u TID w/ meals; titrate based on insulin need with diet -Sensitive SSI, CBG 4 times daily, before meals and at bedtime  -d/c IV fluids -Diabetes education saw today, will continue to follow -Discontinued Endo tool orders

## 2023-03-08 NOTE — Progress Notes (Signed)
Daily Progress Note Intern Pager: [phone]  Patient name: Deborah Sampson Medical record number: [id number] Date of birth: [birth year] Age: 62 y.o. Gender: female  Primary Care Provider: Billey Co, MD Consultants: diabetes education Code Status: full code  Pt Overview and Major Events to Date:  8/17: Admitted, on Endo tool 8/18: Transitioned off Endo tool  Assessment and Plan: Deborah Sampson is 62 y.o. female who presented with polyuria, polydipsia, and fatigue found to be in DKA.  This is a new diagnosis of type 2 diabetes.  She has also had abdominal pain which may be related to this noted new diagnosis however there is concern for accompanying reflux and BV that may complicate picture.   Pertinent PMH/PSH includes HTN, uterine fibroids.  Assessment & Plan DKA (diabetic ketoacidosis) (HCC) New onset T2DM.  Gap closed, transitioned off endotool. A1c 14.3. Got 43 units short acting and 8 units long acting over the past 24 hours. AM BG 250. C-peptide WNL. - Semglee increased to 24 units today - Novolog 3u TID w/ meals; titrate based on insulin need with diet -Sensitive SSI, CBG 4 times daily, before meals and at bedtime  -d/c IV fluids -Diabetes education saw today, will continue to follow -Discontinued Endo tool orders Abdominal pain Improved.  Does have uterine fibroids and had BV that is not fully treated given patient could not tolerate flagyl.  -Monitor symptoms Hypokalemia AM K 3.4. -Klor-Con tablet -AM BMP, Mg Vaginal irritation Recent BV infection, pt did not complete treatment with flagyl due to abdominal pain -subjectively improved -diflucan 150mg  today   FEN/GI: carb modified PPx: full code Dispo:pending clinical improvement  Subjective:  Spoke to patient and daughter via [place] video interpreter. Pt feels much better this morning. Still endorses some abdominal pain but says it is much improved. Ate some porridge yesterday, minimal fluid  intake so far. Discuss importance of using insulin to control new diabetes. Anticipate diabetes education session today. Endorses some vaginal irritation, but states this is improved.  Objective: Temp:  [97.6 F (36.4 C)-98.3 F (36.8 C)] 98.1 F (36.7 C) (08/19 0400) Pulse Rate:  [70-102] 70 (08/19 0400) Resp:  [13-21] 14 (08/19 0400) BP: (101-123)/(66-85) 101/85 (08/19 0400) SpO2:  [91 %-99 %] 97 % (08/19 0400) Physical Exam: General: middle aged female, resting comfortably in bed in no acute distress Cardiovascular: RRR, normal S1/S2, no murmurs, rubs, gallops Respiratory: CTAB, no wheezes, rales, or rhonchi Abdomen: bowel sounds present, soft, mildly tender to palpation at suprapubic area  Extremities: no edema to BLE  Laboratory: Most recent CBC Lab Results  Component Value Date   WBC 5.3 03/06/2023   HGB 17.7 (H) 03/06/2023   HGB 18.0 (H) 03/06/2023   HCT 52.0 (H) 03/06/2023   HCT 53.0 (H) 03/06/2023   MCV 83.7 03/06/2023   PLT 289 03/06/2023   Most recent BMP    Latest Ref Rng & Units 03/08/2023    1:34 AM  BMP  Glucose 70 - 99 mg/dL 696   BUN 6 - 20 mg/dL 7   Creatinine 2.95 - 2.84 mg/dL 1.32   Sodium 440 - 102 mmol/L 134   Potassium 3.5 - 5.1 mmol/L 3.4   Chloride 98 - 111 mmol/L 105   CO2 22 - 32 mmol/L 22   Calcium 8.9 - 10.3 mg/dL 8.1     Other pertinent labs C-peptide: 2.0  Imaging/Diagnostic Tests: none Lorayne Bender, MD 03/08/2023, 7:08 AM  PGY-1, [facility] FPTS Intern pager: [phone], text  pages welcome Secure chat group [facility] Teaching Service

## 2023-03-08 NOTE — Assessment & Plan Note (Signed)
Recent BV infection, pt did not complete treatment with flagyl due to abdominal pain -subjectively improved -diflucan 150mg  today

## 2023-03-08 NOTE — Assessment & Plan Note (Signed)
AM K 3.4. -Klor-Con tablet -AM BMP, Mg

## 2023-03-09 ENCOUNTER — Other Ambulatory Visit (HOSPITAL_COMMUNITY): Payer: Self-pay

## 2023-03-09 DIAGNOSIS — E1110 Type 2 diabetes mellitus with ketoacidosis without coma: Secondary | ICD-10-CM | POA: Diagnosis not present

## 2023-03-09 LAB — GLUCOSE, CAPILLARY
Glucose-Capillary: 347 mg/dL — ABNORMAL HIGH (ref 70–99)
Glucose-Capillary: 364 mg/dL — ABNORMAL HIGH (ref 70–99)
Glucose-Capillary: 323 mg/dL — ABNORMAL HIGH (ref 70–99)

## 2023-03-09 LAB — BASIC METABOLIC PANEL
Anion gap: 10 (ref 5–15)
BUN: 10 mg/dL (ref 6–20)
CO2: 25 mmol/L (ref 22–32)
Calcium: 8.4 mg/dL — ABNORMAL LOW (ref 8.9–10.3)
Chloride: 100 mmol/L (ref 98–111)
Creatinine, Ser: 0.64 mg/dL (ref 0.44–1.00)
GFR, Estimated: >60 mL/min (ref >60)
Glucose, Bld: 257 mg/dL — ABNORMAL HIGH (ref 70–99)
Potassium: 3.9 mmol/L (ref 3.5–5.1)
Sodium: 135 mmol/L (ref 135–145)

## 2023-03-09 LAB — MAGNESIUM: Magnesium: 2 mg/dL (ref 1.7–2.4)

## 2023-03-09 LAB — LDL CHOLESTEROL, DIRECT: Direct LDL: 125 mg/dL — ABNORMAL HIGH (ref 0–99)

## 2023-03-09 MED ORDER — INSULIN PEN NEEDLE 32G X 4 MM MISC
1.0000 | Freq: Once | 0 refills | Status: AC
  Filled 2023-03-09: qty 100, 25d supply, fill #0

## 2023-03-09 MED ORDER — INSULIN GLARGINE-YFGN 100 UNIT/ML ~~LOC~~ SOLN
30.0000 [IU] | SUBCUTANEOUS | Status: DC
  Administered 2023-03-09: 30 [IU] via SUBCUTANEOUS
  Filled 2023-03-09 (×2): qty 0.3

## 2023-03-09 MED ORDER — ATORVASTATIN CALCIUM 40 MG PO TABS
40.0000 mg | ORAL_TABLET | Freq: Every day | ORAL | 0 refills | Status: DC
  Filled 2023-03-09: qty 30, 30d supply, fill #0

## 2023-03-09 MED ORDER — INSULIN LISPRO (1 UNIT DIAL) 100 UNIT/ML (KWIKPEN)
5.0000 [IU] | PEN_INJECTOR | Freq: Three times a day (TID) | SUBCUTANEOUS | 0 refills | Status: DC
Start: 2023-03-09 — End: 2023-04-01
  Filled 2023-03-09: qty 3, 20d supply, fill #0

## 2023-03-09 MED ORDER — INSULIN GLARGINE-YFGN 100 UNIT/ML ~~LOC~~ SOPN
40.0000 [IU] | PEN_INJECTOR | Freq: Every day | SUBCUTANEOUS | 0 refills | Status: DC
  Filled 2023-03-09: qty 12, 30d supply, fill #0

## 2023-03-09 MED ORDER — INSULIN STARTER KIT- PEN NEEDLES (ENGLISH)
1.0000 | Freq: Once | Status: AC
  Administered 2023-03-09: 1
  Filled 2023-03-09: qty 1

## 2023-03-09 NOTE — Assessment & Plan Note (Signed)
AM K 3.9. -AM BMP, Mg

## 2023-03-09 NOTE — Discharge Summary (Cosign Needed Addendum)
Family Medicine Teaching [facility] Discharge Summary  Patient name: Deborah Sampson Medical record number: [id number] Date of birth: 18-Nov-1960 Age: 62 y.o. Gender: female Date of Admission: 03/06/2023  Date of Discharge: 03/09/23 Admitting Physician: Lockie Mola, MD  Primary Care Provider: Billey Co, MD Consultants: None  Indication for Hospitalization: DKA  Discharge Diagnoses/Problem List:  Principal Problem for Admission: DKA Other Problems addressed during stay:  Principal Problem:   DKA (diabetic ketoacidosis) (HCC) Active Problems:   Abdominal pain  Brief Hospital Course:  Deborah Sampson is a 62 y.o.female with a history of HTN, PUD who was admitted to the [facility] Medicine Teaching Service at [facility] for DKA. Her hospital course is detailed below:  DKA  T2DM  Patient presented with abdominal pain, polyuria, and polydipsia, vomiting, generalized fatigue x 1 week and 5 kg weight loss and was found to be in DKA. She does not have a history of diabetes.  She was started on IV fluids and insulin with improvement of symptoms and labs.  Hemoglobin A1c was found to be 14.3.  Once her anion gap closed, she was transitioned to subcutaneous insulin. She received education from diabetes coordinator while in the hospital.  Regimen on discharge was Semglee 40 units daily and Novolog 5 units with meals.  Additionally, patient was started on atorvastatin 40 mg as direct LDL was 125.  Vaginal irritation Recently diagnosed with BV but did not finish Flagyl.  Did not continue as suspected abdominal pain was related to DKA.  She was treated with Diflucan for vaginitis.  She denied ongoing symptoms upon discharge.  Other chronic conditions were medically managed with home medications and formulary alternatives as necessary (none)  PCP Follow-up Recommendations: Review diabetes treatment plan.  Address glucometer and strips if patient does not have them.  Unfortunately these are not  dispensed at discharge.  However, she was provided information to pick these up from pharmacy. Address statin.  Recheck lipid panel in 6 to 8 weeks. Incidental finding- 8 mm left lower lobe pulmonary nodule. Non-contrast chest CT at 6-12 months is recommended  Disposition: stable  Discharge Condition: home  Discharge Exam:  Vitals:   03/09/23 1141 03/09/23 1612  BP: 126/63   Pulse: 93   Resp: 19 20  Temp:  97.8 F (36.6 C)  SpO2: 97%    General: Middle-aged female resting comfortably in bed, no acute distress Cardiovascular: Regular rate and rhythm, normal S1/S2, no murmurs, rubs, gallops Respiratory: Clear to auscultation bilaterally, no wheezes, rales, or rhonchi Abdomen: Bowel sounds present, soft, nontender, nondistended  Significant Procedures: none  Significant Labs and Imaging:  No results for input(s): "WBC", "HGB", "HCT", "PLT" in the last 48 hours. Recent Labs  Lab 03/08/23 0134 03/09/23 0226  NA 134* 135  K 3.4* 3.9  CL 105 100  CO2 22 25  GLUCOSE 275* 257*  BUN 7 10  CREATININE 0.65 0.64  CALCIUM 8.1* 8.4*  MG 1.9 2.0   C-peptide: 2.0 LDL: 125  Pertinent Imaging  CT abdomen/pelvis 8/17 1. No acute findings in the abdomen or pelvis. Specifically, no findings to explain the patient's history of abdominal pain. 2. 8 mm left lower lobe pulmonary nodule. Non-contrast chest CT at 6-12 months is recommended. If the nodule is stable at time of repeat CT, then future CT at 18-24 months (from today's scan) is considered optional for low-risk patients, but is recommended for high-risk patients. This recommendation follows the consensus statement: Guidelines for Management of Incidental Pulmonary Nodules Detected on  CT Images: From the Fleischner Society 2017; Radiology 2017; [phone].  Results/Tests Pending at Time of Discharge: none  Discharge Medications:  Allergies as of 03/09/2023       Reactions   Penicillins Rash        Medication List      STOP taking these medications    famotidine 20 MG tablet Commonly known as: Pepcid   fluconazole 150 MG tablet Commonly known as: DIFLUCAN   hydrochlorothiazide 25 MG tablet Commonly known as: HYDRODIURIL   metroNIDAZOLE 500 MG tablet Commonly known as: FLAGYL       TAKE these medications    amLODipine 5 MG tablet Commonly known as: NORVASC Take 1 tablet (5 mg total) by mouth at bedtime.   atorvastatin 40 MG tablet Commonly known as: Lipitor Take 1 tablet (40 mg total) by mouth daily.   HumaLOG KwikPen 100 UNIT/ML KwikPen Generic drug: insulin lispro Inject 5 Units into the skin 3 (three) times daily with meals.   Semglee (yfgn) 100 UNIT/ML Pen Generic drug: insulin glargine-yfgn Inject 40 Units into the skin daily.   TechLite Plus Pen Needles 32G X 4 MM Misc Generic drug: Insulin Pen Needle Use as directed to inject insulin       Discharge Instructions: Please refer to Patient Instructions section of EMR for full details.  Patient was counseled important signs and symptoms that should prompt return to medical care, changes in medications, dietary instructions, activity restrictions, and follow up appointments.   Follow-Up Appointments: Dr Miquel Dunn, [facility], Tuesday, August 27th at 8:50AM  Lorayne Bender, MD 03/09/2023, 7:42 PM PGY-1, [facility] Family Medicine  I was personally present and performed medical decision making activities of this service and have verified that the service and findings are accurately documented in the student's note.  Shelby Mattocks, DO                  03/09/2023, 7:42 PM

## 2023-03-09 NOTE — TOC Benefit Eligibility Note (Signed)
Patient Product/process development scientist completed.    The patient is insured through  [employer] . Patient has [employer], may use a copay card, and/or apply for patient assistance if available.    Ran test claim for Semglee Pen and the current 30 day co-pay is $75.00.  Ran test claim for Humalog KwikPen and the current 30 day co-pay is $75.00.   This test claim was processed through [facility]- copay amounts may vary at other pharmacies due to pharmacy/plan contracts, or as the patient moves through the different stages of their insurance plan.     Roland Earl, CPHT Pharmacy Technician III Certified Patient Advocate [facility] Pharm[facility] Patient Advocate Team Direct Number: [phone]  Fax: [phone]

## 2023-03-09 NOTE — Discharge Instructions (Addendum)
Dear Deborah Sampson,   Thank you for letting us participate in your care! In this section, you will find a brief hospital admission summary of why you were admitted to the hospital, what happened during your admission, your diagnosis/diagnoses, and recommended follow up.  Primary diagnosis: Type 2 diabetes Treatment plan:  Long acting insulin Short acting insulin with meals Correction insulin  Daily statin   POST-HOSPITAL & CARE INSTRUCTIONS We recommend following up with your PCP within 1 week from being discharged from the hospital. Please let PCP/Specialists know of any changes in medications that were made which you will be able to see in the medications section of this packet.  DOCTOR'S APPOINTMENTS & FOLLOW UP Future Appointments  Date Time Provider Department Center  03/16/2023  8:50 AM Pray, Milus Mallick, MD [facility]     Thank you for choosing [facility]! Take care and be well!  Family Medicine Teaching Service Inpatient Team [place]  [facility]  [address] [place], [place] [address] [phone] ---------------------------------------------------------------------------------------------------------------------------------------------------- These are the patient assistance coupon cards used to reduce the cost of your insulin. Please show these cards at your pharmacy when refilling your insulin prescriptions.

## 2023-03-09 NOTE — Assessment & Plan Note (Addendum)
New onset T2DM.  Gap closed, transitioned off endotool. A1c 14.3. Got 19 units short acting and 24 units long acting over the past 24 hours. AM BG 347. C-peptide WNL. - Increase long acting to 30 units today - Novolog 3u TID w/ meals; titrate based on insulin need with diet -Sensitive SSI, CBG 4 times daily, before meals and at bedtime  -Diabetes education to see again today -check LDL and discuss starting statin

## 2023-03-09 NOTE — Progress Notes (Signed)
Daily Progress Note Intern Pager: [phone]  Patient name: Deborah Sampson Medical record number: [id number] Date of birth: [birth year] Age: 62 y.o. Gender: female  Primary Care Provider: Billey Co, MD Consultants: diabetes education Code Status: Full code  Pt Overview and Major Events to Date:  8/17: Admitted, on Endo tool 8/18: Transitioned off Endo tool  Assessment and Plan: Deborah Sampson is 62 y.o. female who presented with polyuria, polydipsia, and fatigue found to be in DKA. This is a new diagnosis of type 2 diabetes. She has also had abdominal pain likely related to her DKA, possibly secondary to accompanying reflux and untreated BV.  Pertinent PMH/PSH: HTN, uterine fibroids Assessment & Plan DKA (diabetic ketoacidosis) (HCC) New onset T2DM.  Gap closed, transitioned off endotool. A1c 14.3. Got 19 units short acting and 24 units long acting over the past 24 hours. AM BG 347. C-peptide WNL. - Increase long acting to 30 units today - Novolog 3u TID w/ meals; titrate based on insulin need with diet -Sensitive SSI, CBG 4 times daily, before meals and at bedtime  -Diabetes education to see again today -check LDL and discuss starting statin  Abdominal pain Improved.  Does have uterine fibroids and had BV that is not fully treated given patient could not tolerate flagyl.  -Monitor symptoms Hypokalemia (Resolved: 03/09/2023) AM K 3.9. -AM BMP, Mg Vaginal irritation (Resolved: 03/09/2023) Recent BV infection, pt did not complete treatment with flagyl due to abdominal pain. S/p diflucan 150mg . Denies ongoing symptoms.      FEN/GI: carb modified PPx: lovenox Dispo: pending clinical improvement  Subjective:  Spoke to patient and daughter via [place] video interpreter.  Patient states she is feeling well this morning.  Endorses some mild abdominal pain but says it is much improved.  Denies vaginal irritation.  Reviewed need for daily insulin, long-acting and short  acting, and discussed its role in keeping blood sugars at a normal level.  Patient and daughter had some questions about insulin and diabetes, diabetes educator to meet with them today.  Patient feels comfortable going home today.  Patient has a follow-up appointment at the [facility] clinic scheduled for next Tuesday.  Objective: Temp:  [97.6 F (36.4 C)-98.1 F (36.7 C)] 97.9 F (36.6 C) (08/20 0802) Pulse Rate:  [75-94] 92 (08/20 0802) Resp:  [15-24] 15 (08/20 0802) BP: (102-127)/(64-86) 127/86 (08/20 0802) SpO2:  [92 %-98 %] 94 % (08/20 0802) Physical Exam: General: Middle-aged female resting comfortably in bed, no acute distress Cardiovascular: Regular rate and rhythm, normal S1/S2, no murmurs, rubs, gallops Respiratory: Clear to auscultation bilaterally, no wheezes, rales, or rhonchi Abdomen: Bowel sounds present, soft, nontender, nondistended   Laboratory: Most recent CBC Lab Results  Component Value Date   WBC 5.3 03/06/2023   HGB 17.7 (H) 03/06/2023   HGB 18.0 (H) 03/06/2023   HCT 52.0 (H) 03/06/2023   HCT 53.0 (H) 03/06/2023   MCV 83.7 03/06/2023   PLT 289 03/06/2023   Most recent BMP    Latest Ref Rng & Units 03/09/2023    2:26 AM  BMP  Glucose 70 - 99 mg/dL 147   BUN 6 - 20 mg/dL 10   Creatinine 8.29 - 1.00 mg/dL 5.62   Sodium 130 - 865 mmol/L 135   Potassium 3.5 - 5.1 mmol/L 3.9   Chloride 98 - 111 mmol/L 100   CO2 22 - 32 mmol/L 25   Calcium 8.9 - 10.3 mg/dL 8.4     Imaging/Diagnostic Tests: none Esly Selvage,  MD 03/09/2023, 9:41 AM  PGY-1, [facility] Family Medicine FPTS Intern pager: [phone], text pages welcome Secure chat group [facility] Teaching Service

## 2023-03-09 NOTE — Assessment & Plan Note (Signed)
Improved.  Does have uterine fibroids and had BV that is not fully treated given patient could not tolerate flagyl.  -Monitor symptoms

## 2023-03-09 NOTE — Assessment & Plan Note (Addendum)
Recent BV infection, pt did not complete treatment with flagyl due to abdominal pain. S/p diflucan 150mg . Denies ongoing symptoms.

## 2023-03-09 NOTE — Plan of Care (Signed)

## 2023-03-09 NOTE — Progress Notes (Addendum)
Discharge instructions reviewed with pt, family member at bedside and her niece (that speaks [place]) over the phone, interpreted to pt while I reviewed the instructions with pt/family at bedside and with the niece.   [facility] TOC pharmacy filled scripts as written and delivered to pt's room. This nurse reviewed with pt/family at bedside how to apply the needle, waist 2 units then dial her dosage amount 40 units for Semglee qam and 5 units of humalog at each meal.  (Niece listening also to instructions and interpreting).  DM coordinator reviewed this information with pt yesterday (see DM coord notes).    Pt asked about glucometer, none in room and none at home. Meters can be obtained at any pharmacy without a script Public relations account executive has a selection and the cost is low). Reached out to the Dr Dolan Amen and made them aware. Pt will be advised to get the glucometer and strips at local pharmacy/[facility] or [facility] or [facility], etc.  ([facility] closed and they do not dispense glucometers per main pharmacy). Copy of instructions given to pt.  Return to note work given to pt as per MD.   Pt to be d/c'd via wheelchair with belongings, with family.             To be escorted by staff.   Annice Needy, RN SWOT

## 2023-03-10 ENCOUNTER — Telehealth: Payer: Self-pay

## 2023-03-10 NOTE — Transitions of Care (Post Inpatient/ED Visit) (Signed)
   03/10/2023  Name: Deborah Sampson MRN: [id number] DOB: [birth year]  Today's TOC FU Call Status: Today's TOC FU Call Status:: Unsuccessful Call (1st Attempt) Unsuccessful Call (1st Attempt) Date: 03/10/23  Attempted to reach the patient regarding the most recent Inpatient/ED visit.  Follow Up Plan: Additional outreach attempts will be made to reach the patient to complete the Transitions of Care (Post Inpatient/ED visit) call.   Signature Karena Addison, LPN [facility] Nurse Health Advisor Direct Dial [phone]

## 2023-03-11 NOTE — Transitions of Care (Post Inpatient/ED Visit) (Signed)
   03/11/2023  Name: Deborah Sampson MRN: [id number] DOB: 09-07-1960  Today's TOC FU Call Status: Today's TOC FU Call Status:: Unsuccessful Call (2nd Attempt) Unsuccessful Call (1st Attempt) Date: 03/10/23 Unsuccessful Call (2nd Attempt) Date: 03/11/23  Attempted to reach the patient regarding the most recent Inpatient/ED visit.  Follow Up Plan: Additional outreach attempts will be made to reach the patient to complete the Transitions of Care (Post Inpatient/ED visit) call.   Signature Karena Addison, LPN [facility] Nurse Health Advisor Direct Dial [phone]

## 2023-03-15 NOTE — Transitions of Care (Post Inpatient/ED Visit) (Signed)
   03/15/2023  Name: Alyxandra Folino MRN: [id number] DOB: 1960/09/12  Today's TOC FU Call Status: Today's TOC FU Call Status:: Unsuccessful Call (3rd Attempt) Unsuccessful Call (1st Attempt) Date: 03/10/23 Unsuccessful Call (2nd Attempt) Date: 03/11/23 Unsuccessful Call (3rd Attempt) Date: 03/15/23  Attempted to reach the patient regarding the most recent Inpatient/ED visit.  Follow Up Plan: No further outreach attempts will be made at this time. We have been unable to contact the patient.  Signature Karena Addison, LPN [facility] Nurse Health Advisor Direct Dial [phone]

## 2023-03-16 ENCOUNTER — Ambulatory Visit (INDEPENDENT_AMBULATORY_CARE_PROVIDER_SITE_OTHER): Payer: 59 | Admitting: Family Medicine

## 2023-03-16 VITALS — BP 132/76 | HR 90 | Wt 173.2 lb

## 2023-03-16 DIAGNOSIS — E1165 Type 2 diabetes mellitus with hyperglycemia: Secondary | ICD-10-CM

## 2023-03-16 DIAGNOSIS — Z794 Long term (current) use of insulin: Secondary | ICD-10-CM

## 2023-03-16 DIAGNOSIS — I10 Essential (primary) hypertension: Secondary | ICD-10-CM

## 2023-03-16 DIAGNOSIS — R911 Solitary pulmonary nodule: Secondary | ICD-10-CM | POA: Diagnosis not present

## 2023-03-16 MED ORDER — FREESTYLE LIBRE 3 READER DEVI: 1.0000 | Freq: Once | 0 refills | Status: AC

## 2023-03-16 MED ORDER — FREESTYLE LIBRE 3 SENSOR MISC
11 refills | Status: DC
Start: 2023-03-16 — End: 2023-04-09

## 2023-03-16 NOTE — Patient Instructions (Addendum)
It was wonderful to see you today.  Please bring ALL of your medications with you to every visit.   Today we talked about:  We checked your urine for protein from your diabetes.   We will see you back in two days to discuss how to use your insulin pens with our pharmacist Dr Raymondo Band.  Thank you for choosing [facility] Family Medicine.   Please call [phone] with any questions about today's appointment.  Please arrive at least 15 minutes prior to your scheduled appointments.   If you had blood work today, I will send you a MyChart message or a letter if results are normal. Otherwise, I will give you a call.   If you had a referral placed, they will call you to set up an appointment. Please give [place] a call if you don't hear back in the next 2 weeks.   If you need additional refills before your next appointment, please call your pharmacy first.   Burley Saver, MD  Family Medicine

## 2023-03-16 NOTE — Assessment & Plan Note (Signed)
At goal, continue home amlodipine Consider addition of ARB for renal protection at follow up, checking urine microalbumin today

## 2023-03-16 NOTE — Progress Notes (Signed)
Introduced to patient and husband today with use of vidoe Nurse, learning disability.   Patient is scheduled for insulin education and injection training on 8/29 (two days from today).     Additionally, patient educated on CGM and we discussed need to pick-up both sensors and reader device prior to the visit in two days.  Plan to set-up Libre 3 CGM at that visit.  Asked patient to pick-up CGM supplies prior to visit.  Following instruction patient verbalized understanding of treatment plan.   [facility]

## 2023-03-16 NOTE — Assessment & Plan Note (Signed)
Discussed with patient, nonsmoker, reminder sent to self for 6 mo f/u CT

## 2023-03-16 NOTE — Progress Notes (Signed)
    SUBJECTIVE:   CHIEF COMPLAINT / HPI:   Hospital f/u- hospitalized from 8/17- 8/20 for DKA with new diagnosis of T2DM, discharged on 40 units of semglee daily, Humalog 5 units TIDWM. Has been checking her BG since she left the hospital but has a friend do it as she has trouble doing it. Her friend also injects her insulin, last took this AM. Has the pens with her but notes she cannot write or read so trouble checking the pens and turning to the correct number. She does not feel comfortable injecting herself. Would like to meet with pharmacy team this week to learn to do this. BG machine shows 93-260 ranges at different times of day. Husband has a smart phone and is at home with her. Denies diaphoresis, shakiness, or other symptoms of hypoglycemia.   HLD- on atorvastatin 40 mg. Taking daily, no side effects.  HTN- on amlodipine 5 mg daily. Takes at night.   Pulm nodule- repeat non-contrast CT chest in 6-12 months. Discussed with patient today.   PERTINENT  PMH / PSH: T2DM on insulin, HTN, GERD  OBJECTIVE:   BP 132/76   Pulse 90   Wt 173 lb 3.2 oz (78.6 kg)   LMP 09/07/2018   BMI 27.96 kg/m   General: A&O, NAD HEENT: No sign of trauma, EOM grossly intact Cardiac: RRR, no m/r/g Respiratory: CTAB, normal WOB, no w/c/r GI: Soft, NTTP, non-distended  Extremities: NTTP, no peripheral edema. Neuro: Normal gait, moves all four extremities appropriately. Psych: Appropriate mood and affect    ASSESSMENT/PLAN:   Type 2 diabetes mellitus with hyperglycemia (HCC) On insulin, without signs of hypoglycemia, but does not feel comfortable checking BG on her own Met Dr Raymondo Band today, appt scheduled for Thursday to learn how to check her BG and use insulin pens, would be amazing if we can get a CGM covered for her On statin, consider ARB at follow up versus SGLT2 when better controlled for renal protection Needs eye exam, will call to see if can get eye doctor appt  Essential hypertension At  goal, continue home amlodipine Consider addition of ARB for renal protection at follow up, checking urine microalbumin today  Pulmonary nodule Discussed with patient, nonsmoker, reminder sent to self for 6 mo f/u CT     Billey Co, MD [facility]

## 2023-03-16 NOTE — Assessment & Plan Note (Signed)
On insulin, without signs of hypoglycemia, but does not feel comfortable checking BG on her own Met Dr Raymondo Band today, appt scheduled for Thursday to learn how to check her BG and use insulin pens, would be amazing if we can get a CGM covered for her On statin, consider ARB at follow up versus SGLT2 when better controlled for renal protection Needs eye exam, will call to see if can get eye doctor appt

## 2023-03-17 ENCOUNTER — Other Ambulatory Visit (HOSPITAL_COMMUNITY): Payer: Self-pay

## 2023-03-17 LAB — MICROALBUMIN / CREATININE URINE RATIO
Creatinine, Urine: 62.4 mg/dL
Microalb/Creat Ratio: <5 mg/g{creat} (ref 0–29)
Microalbumin, Urine: <3 ug/mL

## 2023-03-18 ENCOUNTER — Ambulatory Visit (INDEPENDENT_AMBULATORY_CARE_PROVIDER_SITE_OTHER): Payer: 59 | Admitting: Pharmacist

## 2023-03-18 ENCOUNTER — Encounter: Payer: Self-pay | Admitting: Pharmacist

## 2023-03-18 VITALS — BP 147/85 | Ht 66.0 in | Wt 171.8 lb

## 2023-03-18 DIAGNOSIS — Z794 Long term (current) use of insulin: Secondary | ICD-10-CM

## 2023-03-18 DIAGNOSIS — E1165 Type 2 diabetes mellitus with hyperglycemia: Secondary | ICD-10-CM

## 2023-03-18 MED ORDER — INSULIN PEN NEEDLE 30G X 5 MM MISC
1.0000 | Freq: Once | 11 refills | Status: AC
Start: 2023-03-18 — End: 2023-03-18

## 2023-03-18 NOTE — Patient Instructions (Addendum)
Byari byiza Elam Dutch uyu munsi!  Intego yawe isukari yamaraso ni 80-130 mbere yo [place] na munsi ya 180 nyuma yo [place].  Guhindura imiti: Komeza utere Semglee (ikaramu yubururu yoroheje) ibice 40 muruhu mugitondo.  Komeza utere Humalog (ikaramu yijimye yijimye) ibice 5 kuruhu inshuro eshatu burimunsi MBERE yo [place].  Komeza indi miti yose.  Komeza akazi keza hamwe nimirire no gukora siporo. Intego y'ibiryo [employer], imbuto n'inyama zinanutse (inkoko, [employer], [place]). Gerageza kugabanya gufata umunyu urya imboga nshyashya cyangwa zikonje (aho [place]), kwoza imboga zafunzwe [place] yo guteka kandi ntukongere umunyu wongeyeho kumafunguro.   Gukomeza Glucose Ikurikirana Sensor Porogaramu Niba ukoresheje Porogaramu, urashobora gukanda ubufasha muri menu nkuru kugirango ubone inyigisho ya porogaramu yo Dietitian. Reba hepfo kugirango ubone amabwiriza yukuntu wakuramo porogaramu. 1. Madison Hickman Sensors gusa inyuma yukuboko kwawe hejuru. Niba ishyizwe [employer], Sensor ntishobora gukora neza kandi [place] gusoma bidahwitse. Irinde uduce dufite inkovu, imitsi, ibimenyetso birambuye, [place].    2. Hitamo agace k'uruhu muri rusange guma guma neza mubikorwa bisanzwe bya buri munsi (nta [place] cyangwa [place]). Hitamo urubuga rufite byibura santimetero 1 (2,5 cm) kure yikibanza cyose cyatewe. Kugirango wirinde [place] amahwemo cyangwa Charlette Caffey kuruhu, ugomba guhitamo urubuga rutandukanye nururwo ruherutse gukoreshwa. 3. Elayne Snare urubuga rusaba ukoresheje isabune isanzwe, yumutse, hanyuma usukure uhanagura inzoga. Ibi bizafasha gukuraho ibisigazwa byamavuta bishobora [place] sensor gukomera neza. Emerera urubuga Lonzo Candy yo gukomeza. Icyitonderwa: Agace kagomba kuba gasukuye Marina Goodell, cyangwa Sensor ntishobora kuguma kumwanya wose wigihe cyagenwe na Sensor winjizamo. 4. Kuramo ingofero [employer] Sensor Usaba hanyuma ushire [place].  5. Roderic Scarce Sensor [place] hejuru  yurubuga rwateguwe hanyuma usunike hasi kugirango ushyire Sensor mumubiri wawe. 6. Kurura witonze Sensor Usaba kure yumubiri wawe. Sensor igomba noneho kuba ifatanye nuruhu rwawe. 7. Menya neza ko Sensor ifite umutekano nyuma yo [place]. [place] ingofero inyuma [employer]. [place] Sensor Usaba Ukurikije amabwiriza yaho.   It was nice to see you today!  Your goal blood sugar is 80-130 before eating and less than 180 after eating.  Medication Changes: Continue to inject Semglee (light blue pen) 40 units into the skin in the morning.  Continue to inject Humalog (dark blue pen) 5 units to the skin three times daily BEFORE meals.  Continue all other medication the same.  Keep up the good work with diet and exercise. Aim for a diet full of vegetables, fruit and lean meats (chicken, [place], fish). Try to limit salt intake by eating fresh or frozen vegetables (instead of canned), rinse canned vegetables prior to cooking and do not add any additional salt to meals.   Continuous Glucose Monitor Sensor Application If using the App, you can tap Help in the Main Menu to access an in-app tutorial on applying a Sensor. See below for instructions on how to download the app. Apply Sensors only on the back of your upper arm. If placed in other areas, the Sensor may not function properly and could give you inaccurate readings. Avoid areas with scars, moles, stretch marks, or lumps.   Select an area of skin that generally stays flat during your normal daily activities (no bending or folding). Choose a site that is at least 1 inch (2.5 cm) away from any injection sites. To prevent discomfort or skin irritation, you should select a different site other than the one most recently used. Wash application site using a plain soap, dry, and then clean with an alcohol wipe. This will help remove any oily residue that may prevent the sensor from sticking properly. Allow site to air  dry before proceeding. Note: The  area MUST be clean and dry, or the Sensor may not stay on for the full wear duration specified by your Sensor insert. 4. Unscrew the cap from the Sensor Applicator and set the cap aside.  5. Place the Sensor Applicator over the prepared site and push down firmly to apply the Sensor to your body. 6. Gently pull the Sensor Applicator away from your body. The Sensor should now be attached to your skin. 7. Make sure the Sensor is secure after application. Put the cap back on the Sensor Applicator. Discard the used Engineer, agricultural according to local regulations.

## 2023-03-18 NOTE — Progress Notes (Signed)
S:     Chief Complaint  Patient presents with   Medication Management    Diabetes   62 y.o. female who presents for diabetes evaluation, education, and management. Patient arrives in good spirits and presents. Patient is accompanied by husband and interpreter Theophile.   Patient was referred and last seen by Primary Care Provider, Dr. Miquel Dunn, on 03/16/23.   PMH is significant for HTN, HLD, T2DM. Patient reports poor vision and being unable to read numbers on insulin pens to dial correct doses. At last visit with Dr.Pray, continued all medications that were given at the time of discharge.   Patient reports Diabetes was diagnosed during hospitalization for DKA on 8/17-8/20.  Current diabetes medications include: Semglee (insulin glargine) 40 units once daily, Humalog 5 units three times daily with meals (patient reports taking after meals) Current hypertension medications include: amlodipine 5 mg once daily Current hyperlipidemia medications include: atorvastatin 40 mg once daily  Patient reports adherence to taking all medications as prescribed. Patient reports not being able to see well, so patient's niece helps wind up insulin dose and patient injects it herself.  Insurance coverage: [employer]  O:   Review of Systems  All other systems reviewed and are negative.   Physical Exam Constitutional:      Appearance: Normal appearance.  Pulmonary:     Effort: Pulmonary effort is normal.  Neurological:     Mental Status: She is alert.  Psychiatric:        Mood and Affect: Mood normal.        Behavior: Behavior normal.        Thought Content: Thought content normal.        Judgment: Judgment normal.     Lab Results  Component Value Date   HGBA1C 14.3 (H) 03/06/2023   Vitals:   03/18/23 1122 03/18/23 1127  BP: (!) 161/87 (!) 147/65  SpO2: 100%     Lipid Panel     Component Value Date/Time   LDLDIRECT 125 (H) 03/09/2023 0226    Clinical Atherosclerotic  Cardiovascular Disease (ASCVD): No   A/P: Diabetes recently diagnosed and uncontrolled. As patient reports not being able to see well, thoroughly educated patient and husband on when insulin should be administered, how to select insulin doses, and give injections. Instructed patient to select dose based on the sound and feel of clicks when winding the pen up. Patient's husband demonstrated ability to visualize proper insulin doses and verbalized being able to assist patient with winding up proper dose. Emphasized insulin should be administered before meals, not after. Educated patient on how to properly dispose of insulin needles after use. Medication adherence appears good. - Assisted patient with administering today's dose of Semglee. - Initiated Libre 3 sensor and reader. Educated on how to place sensor every 14 days, patient placed first sensor correctly and verbalized understanding of use, removal, and how to place next sensor. - Continued Semglee (insulin glargine) 40 units into the skin in the AM. - Continued Humalog (insulin lispro) 5 units into the skin three times daily with meals. - Will reassess dose increase of insulin at next visit. -Patient educated on purpose, proper use, and potential adverse effects.  -Extensively discussed pathophysiology of diabetes, recommended lifestyle interventions, dietary effects on blood sugar control.  -Counseled on s/sx of and management of hypoglycemia.  -Patient requests PCP referral for dietician consult.    ASCVD risk - primary prevention in patient with diabetes. Last LDL (03/09/23) is 125 not at  goal of <70  mg/dL.  -Continued atorvastatin 40 mg once daily.  Hypertension currently uncontrolled with office readings of 161/87 and 147/85 mmHg. Written patient instructions provided. Patient verbalized understanding of treatment plan.  Total time in face to face counseling 87 minutes.    Majority of patient visit was spent in face-to-face diabetes  education   Follow-up:  Pharmacist follow up on 04/01/23 with Dr. Raymondo Band PCP clinic visit in PRN Patient seen with Rickey Primus, PharmD Candidate and Andee Poles, PharmD Candidate.

## 2023-03-18 NOTE — Assessment & Plan Note (Signed)
Diabetes recently diagnosed and uncontrolled. As patient reports not being able to see well, thoroughly educated patient and husband on when insulin should be administered, how to select insulin doses, and give injections. Instructed patient to select dose based on the sound and feel of clicks when winding the pen up. Patient's husband demonstrated ability to visualize proper insulin doses and verbalized being able to assist patient with winding up proper dose. Emphasized insulin should be administered before meals, not after. Educated patient on how to properly dispose of insulin needles after use. Medication adherence appears good. - Assisted patient with administering today's dose of Semglee. - Initiated Libre 3 sensor and reader. Educated on how to place sensor every 14 days, patient placed first sensor correctly and verbalized understanding of use, removal, and how to place next sensor. - Continued Semglee (insulin glargine) 40 units into the skin in the AM. - Continued Humalog (insulin lispro) 5 units into the skin three times daily with meals. - Will reassess dose increase of insulin at next visit. -Patient educated on purpose, proper use, and potential adverse effects.  -Extensively discussed pathophysiology of diabetes, recommended lifestyle interventions, dietary effects on blood sugar control.  -Counseled on s/sx of and management of hypoglycemia.  -Patient requests PCP referral for dietician consult.

## 2023-03-19 NOTE — Progress Notes (Signed)
Reviewed and agree with Dr Koval's plan.   

## 2023-03-24 ENCOUNTER — Telehealth: Payer: Self-pay | Admitting: Family Medicine

## 2023-03-24 NOTE — Telephone Encounter (Signed)
Attempted to call patient x2 to discuss FMLA paperwork/desire for return to work/leave options. Unable to leave VM as phone number disconnected.   Called spouse number, pt picked up and confirmed I was speaking with Deborah Sampson. We discussed her FMLA paperwork- she continues to have blurred vision that makes it hard for her to see to pack items at work at [employer] and she does not feel safe to return to work. She is unable to see the numbers to give herself insulin injections. This has been present for past month since before the week she went to hospital. She has not yet gotten an appt with an eye doctor, has list of places to call but has been unable to call. She notes vision is blurry, no loss of vision or floaters. She is not driving and does not have drivers license- does note she had some blurred vision before but it is worse now.  - I called [facility] which notes not taking new patients until January 2025 and no further work ins.  - Called [facility]- scheduled for 03/26/23 at 9:00 am. Pt needs a driver and they will use Kinyarwanda interpretor   FMLA form completed and will leave up front and send copy to pt employer.   Called patient back with Kinyarwanda interpretor x2- pt did not pick up, left a VM with clininc number to call back.  If patient calls back: PLEASE DISUCSS date, time and location of eye exam.  [facility] on Sept 6 2024 at 900 AM [address], [place], [place] [address]  In person interpreter requested.   Will continue to try to call patient to let her know about appt.  Burley Saver MD

## 2023-03-24 NOTE — Telephone Encounter (Signed)
Patient dropped off FMLA paperwork to be completed. Last DOS was 03/16/23. Placed in [employer].

## 2023-03-24 NOTE — Telephone Encounter (Signed)
Reviewed form and placed in PCP's box for completion.  .Michelle R Simpson, CMA  

## 2023-03-25 ENCOUNTER — Telehealth: Payer: Self-pay

## 2023-03-25 NOTE — Telephone Encounter (Signed)
Called patient on spouse phone [phone] via the [facility] Interpreter named Amudu ID: [id number]. Unable to get in touch with patient and spouse. LVM informing the patient of an upcoming appointment tomorrow at 9am at [facility].  Drusilla Kanner, CMA

## 2023-03-25 NOTE — Telephone Encounter (Signed)
Form found in RN box.   Copy made and placed to batch scanning.   Original at front desk for pick up. Faxed to employer.   Veronda Prude, RN

## 2023-03-26 ENCOUNTER — Encounter (INDEPENDENT_AMBULATORY_CARE_PROVIDER_SITE_OTHER): Payer: 59 | Admitting: Ophthalmology

## 2023-03-26 DIAGNOSIS — H3581 Retinal edema: Secondary | ICD-10-CM

## 2023-03-30 ENCOUNTER — Telehealth: Payer: Self-pay | Admitting: Family Medicine

## 2023-03-30 NOTE — Telephone Encounter (Signed)
Patient dropped off return to work paperwork to be completed. Last DOS was 03/16/23. Patient stated that she is supposed to go back to work tomorrow at [employer] and asks if paperwork could be completed by then. Did explain that it could take up to 5 days. Placed in [employer].

## 2023-04-01 ENCOUNTER — Encounter: Payer: Self-pay | Admitting: Pharmacist

## 2023-04-01 ENCOUNTER — Ambulatory Visit (INDEPENDENT_AMBULATORY_CARE_PROVIDER_SITE_OTHER): Payer: 59 | Admitting: Pharmacist

## 2023-04-01 VITALS — BP 136/72 | HR 96 | Ht 65.0 in | Wt 171.6 lb

## 2023-04-01 DIAGNOSIS — E1165 Type 2 diabetes mellitus with hyperglycemia: Secondary | ICD-10-CM | POA: Diagnosis not present

## 2023-04-01 DIAGNOSIS — Z794 Long term (current) use of insulin: Secondary | ICD-10-CM | POA: Diagnosis not present

## 2023-04-01 DIAGNOSIS — I10 Essential (primary) hypertension: Secondary | ICD-10-CM

## 2023-04-01 MED ORDER — INSULIN LISPRO (1 UNIT DIAL) 100 UNIT/ML (KWIKPEN): 10.0000 [IU] | PEN_INJECTOR | Freq: Three times a day (TID) | SUBCUTANEOUS | 0 refills | Status: DC

## 2023-04-01 MED ORDER — INSULIN GLARGINE-YFGN 100 UNIT/ML ~~LOC~~ SOPN: 30.0000 [IU] | PEN_INJECTOR | Freq: Every day | SUBCUTANEOUS | 0 refills | Status: DC

## 2023-04-01 NOTE — Assessment & Plan Note (Signed)
Diabetes recently diagnosed and controlled with GMI of 6.7%. As patient reports not being able to see well, thoroughly educated patient and husband on when insulin should be administered, how to select insulin doses, and give injections. Instructed patient to select dose based on the sound and feel of clicks when winding the pen up. Patient's husband demonstrated ability to visualize proper insulin doses and verbalized being able to assist patient with winding up proper dose. Medication adherence appears good. - Assisted patient with replacement of Libre 3 sensor and connection to reader. - Decreased Semglee (insulin glargine) from 40 to 30 units once daily. - Increased Humalog (insulin lispro) from 5 to 10 units into the skin two - three times daily with larger meals. -Patient educated on purpose, proper use, and potential adverse effects.  -Extensively discussed pathophysiology of diabetes, recommended lifestyle interventions, dietary effects on blood sugar control.  -Counseled on s/sx of and management of hypoglycemia.

## 2023-04-01 NOTE — Assessment & Plan Note (Signed)
Hypertension currently nearly controlled with office reading of 136/72 mm Hg. Blood pressure goal of <130/80 mmHg. Medication adherence good.  -Continued amlodipine 5 mg once daily.

## 2023-04-01 NOTE — Progress Notes (Signed)
S:     Chief Complaint  Patient presents with   Medication Management    Diabetes f/u   63 y.o. female who presents for diabetes evaluation, education, and management. Patient arrives in good spirits and presents without any assistance. Patient is accompanied by husband and interpretor Theophile.   Patient was referred and last seen by Primary Care Provider, Dr. Miquel Dunn, on 03/16/23. Last seen by pharmacy clinic on 03/18/23. At last visit, initiated [facility] 3 sensor and assisted patient with administering dose of Semglee (insulin glargine).  PMH is significant for T2DM, HTN, HLD.   Patient reports Diabetes was diagnosed during hospitalization for DKA on 8/17-8/20. .   Current diabetes medications include: Semglee (insulin glargine) 40 units once daily, Humalog (insulin lispro) 5 units three times daily with meals Current hypertension medications include: amlodipine 5 mg once daily  Current hyperlipidemia medications include: atorvastatin 40 mg once daily   Patient reports adherence to taking all medications as prescribed.   Insurance coverage: [employer]  Patient reports hypoglycemic events. BG 50-60 mg/dl  Reported home fasting blood sugars: normal-elevated   Patient reports not being able to see well, so patient's husband helps wind up insulin dose and patient injects it herself.  Patient reported dietary habits: Eats 2-3 meals/day Breakfast: bananas Dinner: vegetables Snacks: pumpkin  O:   Review of Systems  Eyes:  Positive for blurred vision (less than previous but remains problematic).  All other systems reviewed and are negative.   Physical Exam Constitutional:      Appearance: Normal appearance.  Pulmonary:     Effort: Pulmonary effort is normal.  Neurological:     Mental Status: She is alert.  Psychiatric:        Mood and Affect: Mood normal.        Behavior: Behavior normal.        Thought Content: Thought content normal.        Judgment: Judgment  normal.     7 day average blood glucose: 143 mg/dL  Libre3 CGM Download today on 04/01/23 % Time CGM is active: 97% Average Glucose: 143 mg/dL Glucose Management Indicator: 6.7%  Glucose Variability: 32.2% (goal <36%) Time in Goal:  - Time in range 70-180: 78% - Time above range: 21% - Time below range: 1% Observed patterns: BG readings increase after meals   Lab Results  Component Value Date   HGBA1C 14.3 (H) 03/06/2023   Vitals:   04/01/23 1107  BP: 136/72  Pulse: 96  SpO2: 100%    Lipid Panel     Component Value Date/Time   LDLDIRECT 125 (H) 03/09/2023 0226    Clinical Atherosclerotic Cardiovascular Disease (ASCVD): No   A/P: Diabetes recently diagnosed and controlled with GMI of 6.7%. As patient reports not being able to see well, thoroughly educated patient and husband on when insulin should be administered, how to select insulin doses, and give injections. Instructed patient to select dose based on the sound and feel of clicks when winding the pen up. Patient's husband demonstrated ability to visualize proper insulin doses and verbalized being able to assist patient with winding up proper dose. Medication adherence appears good. - Assisted patient with replacement of Libre 3 sensor and connection to reader. - Decreased Semglee (insulin glargine) from 40 to 30 units once daily. - Increased Humalog (insulin lispro) from 5 to 10 units into the skin two - three times daily with larger meals. -Patient educated on purpose, proper use, and potential adverse effects.  -Extensively  discussed pathophysiology of diabetes, recommended lifestyle interventions, dietary effects on blood sugar control.  -Counseled on s/sx of and management of hypoglycemia.  ASCVD risk - primary prevention in patient with diabetes. Last LDL is 125 not at goal of <09 mg/dL.  -Continued atorvastatin 40 mg once daily.  Hypertension currently nearly controlled with office reading of 136/72 mm Hg.  Blood pressure goal of <130/80 mmHg. Medication adherence good.  -Continued amlodipine 5 mg once daily.  Dr. Dolan Amen provided patient with doctor's note stating patient is cleared to return to work. Additional paperwork may be required.  This paperwork will be forwarded to her PCP, Dr. Miquel Dunn for completion if necessary.   Written patient instructions provided. Patient verbalized understanding of treatment plan.  Total time in face to face counseling 38 minutes.    Follow-up:  Pharmacist visit with Dr.Maresa Morash on 09/26  PCP clinic visit PRN Patient seen with Alesia Banda, PharmD Candidate and Andee Poles, PharmD Candidate.

## 2023-04-01 NOTE — Patient Instructions (Signed)
It was nice to see you today!  Your goal blood sugar is 80-130 before eating and less than 180 after eating.  Medication Changes:  Increase Humalog (insulin lispro) to 10 units with meals.  Decrease Semglee (insulin glargine) to 30 units once daily.  Continue all other medication the same.   Monitor blood sugars at home and keep a log (glucometer or piece of paper) to bring with you to your next visit.  Keep up the good work with diet and exercise. Aim for a diet full of vegetables, fruit and lean meats (chicken, [place], fish). Try to limit salt intake by eating fresh or frozen vegetables (instead of canned), rinse canned vegetables prior to cooking and do not add any additional salt to meals.

## 2023-04-02 NOTE — Telephone Encounter (Signed)
Sent to [employer] Team. Aquilla Solian, CMA

## 2023-04-02 NOTE — Progress Notes (Signed)
Reviewed and agree with Dr Koval's plan.   

## 2023-04-02 NOTE — Telephone Encounter (Signed)
Clinical info completed on Disability  form.  Placed form in PCP's box for completion.    When form is completed, please route note to "RN Team" and place in wall pocket in front office.   Aquilla Solian, CMA

## 2023-04-02 NOTE — Telephone Encounter (Signed)
Sent to green team Maree Erie, CMA

## 2023-04-05 ENCOUNTER — Ambulatory Visit (INDEPENDENT_AMBULATORY_CARE_PROVIDER_SITE_OTHER): Payer: 59 | Admitting: Pharmacist

## 2023-04-05 ENCOUNTER — Encounter: Payer: Self-pay | Admitting: Pharmacist

## 2023-04-05 DIAGNOSIS — E1165 Type 2 diabetes mellitus with hyperglycemia: Secondary | ICD-10-CM | POA: Diagnosis not present

## 2023-04-05 DIAGNOSIS — Z794 Long term (current) use of insulin: Secondary | ICD-10-CM | POA: Diagnosis not present

## 2023-04-05 MED ORDER — INSULIN LISPRO (1 UNIT DIAL) 100 UNIT/ML (KWIKPEN): 10.0000 [IU] | PEN_INJECTOR | Freq: Three times a day (TID) | SUBCUTANEOUS | 0 refills | Status: DC

## 2023-04-05 NOTE — Patient Instructions (Addendum)
Deborah Sampson Dutch uyu munsi!  Intego yawe isukari yamaraso ni 80-130 mbere yo [place] na munsi ya 180 nyuma yo [place].  Guhindura imiti:  Zana imiti yose muruzinduko ejo.   Twohereje Humalog (insuline lispro) kuri [facility] [address] ya [place] kugirango tuyitware.  Komeza indi miti yose.   Komeza akazi keza hamwe nimirire no gukora siporo. Intego y'ibiryo [employer], imbuto n'inyama zinanutse (inkoko, [place], [place]). Gerageza kugabanya gufata umunyu urya imboga nshyashya cyangwa zikonje (aho [place]), kwoza imboga zafunzwe Rudy yo guteka kandi ntukongere umunyu wongeyeho kumafunguro.  It was nice to see you today!  Your goal blood sugar is 80-130 before eating and less than 180 after eating.  Medication Changes:  Bring all medications to the visit tomorrow.   We [facility] [address] [facility] up.  Continue all other medication the same.   Keep up the good work with diet and exercise. Aim for a diet full of vegetables, fruit and lean meats (chicken, [place], fish). Try to limit salt intake by eating fresh or frozen vegetables (instead of canned), rinse canned vegetables prior to cooking and do not add any additional salt to meals.

## 2023-04-05 NOTE — Progress Notes (Addendum)
Reviewed and agree with Dr Macky Lower plan./T.McDiarmid MD     S:     Chief Complaint  Patient presents with   Medication Management    CGM sensor    62 y.o. female who presents for diabetes evaluation, education, and management. Patient arrives in good spirits and presents without any assistance. Patient is accompanied by husband and speaks [place]. Entire visit was with the assistance of video interpreter, Rulon Eisenmenger #[id number]. Patient presents as her Deborah Sampson reader shows that her sensor is not working properly and asks to be seen as a work-in.   Patient reports going to [facility] on [address] with her husband to pick up Humalog (insulin lispro). Patient says they told her they didn't have the medication for her and sent her to the [facility] on [facility]. She states that they told her that her insulin lispro was being stopped and to start another diabetes medication that is taken three times daily. Patient does not recall the name of this medication and reports that she forgot to bring her medications with her today.  Patient was referred and last seen by Primary Care Provider, Dr. Miquel Dunn, on 03/16/23.   PMH is significant for T2DM, HTN, HLD.  At last visit, decreased Semglee (insulin glargine) from 40 to 30 units once daily and increased Humalog (insulin lispro) from 5 to 10 units into the skin two - three times daily with larger meals.  Patient reports Diabetes was diagnosed in during hospitalization for DKA on 8/17-8/20.   Current diabetes medications include: Semglee (insulin glargine) 30 units once daily, Humalog (insulin lispro) 10 units three times daily with meals (patient reports being told by pharmacy that this medication was discontinued and patient was started on another diabetes medication to be taken three times daily) Current hypertension medications include: amlodipine 5 mg once daily  Current hyperlipidemia medications include: atorvastatin 40 mg once  daily   Patient reports adherence to taking all medications as prescribed.   Insurance coverage: [employer]    O:   Review of Systems  All other systems reviewed and are negative.   Physical Exam Constitutional:      Appearance: Normal appearance.  Pulmonary:     Effort: Pulmonary effort is normal.  Neurological:     Mental Status: She is alert.  Psychiatric:        Mood and Affect: Mood normal.        Behavior: Behavior normal.        Thought Content: Thought content normal.        Judgment: Judgment normal.     Lab Results  Component Value Date   HGBA1C 14.Sampson (H) 03/06/2023   There were no vitals filed for this visit.  Lipid Panel     Component Value Date/Time   LDLDIRECT 125 (H) 03/09/2023 0226    Clinical Atherosclerotic Cardiovascular Disease (ASCVD): No   A/P: Diabetes longstanding currently controlled based on last GMI of 6.7%. Patient is able to verbalize appropriate hypoglycemia management plan. Medication adherence appears good.  - Assisted patient with new sensor application and connection to [place] Sampson reader. - Called [facility], [facility], and [facility] to determine the new medication that the patient claims she was started on to substitute the Humalog (insulin lispro). None of the pharmacies had the new medication on file. - Instructed the patient to hold the new medication for today and come back tomorrow with all her medications. - Also asked to pick-up insulin at [facility]  outpatient pharmacy. -Patient educated on purpose, proper use, and potential adverse effects.  -Extensively discussed pathophysiology of diabetes, recommended lifestyle interventions, dietary effects on blood sugar control.  -Counseled on s/sx of and management of hypoglycemia.   ASCVD risk - primary prevention in patient with diabetes. Last LDL is 125 not at goal of <01 mg/dL.  -Continued atorvastatin 40 mg once daily.   Hypertension currently nearly  controlled with office reading of 126/75 mm Hg. Blood pressure goal of <130/80 mmHg. Medication adherence good.  -Continued amlodipine 5 mg once daily. - [facility] reported that patient's hydrochlorothiazide 25 mg once daily is ready to be picked up. As patient's BP is controlled today, instructed patient not to pick that medication up.  Written patient instructions provided. Patient verbalized understanding of treatment plan.  Total time in face to face counseling, pharmacy contact/interaction and educaton:  51 minutes.    Follow-up:  Pharmacist follow up visit tomorrow PCP clinic visit PRN Patient seen with Alesia Banda, PharmD Candidate and Andee Poles, PharmD Candidate.

## 2023-04-05 NOTE — Assessment & Plan Note (Signed)
Diabetes longstanding currently controlled based on last GMI of 6.7%. Patient is able to verbalize appropriate hypoglycemia management plan. Medication adherence appears good.  - Assisted patient with new sensor application and connection to [place] 3 reader. - Called [facility], [facility], and [facility] to determine the new medication that the patient claims she was started on to substitute the Humalog (insulin lispro). None of the pharmacies had the new medication on file. - Instructed the patient to hold the new medication for today and come back tomorrow with all her medications. - Also asked to pick-up insulin at [facility] outpatient pharmacy. -Patient educated on purpose, proper use, and potential adverse effects.  -Extensively discussed pathophysiology of diabetes, recommended lifestyle interventions, dietary effects on blood sugar control.  -Counseled on s/sx of and management of hypoglycemia.

## 2023-04-06 ENCOUNTER — Ambulatory Visit: Payer: 59 | Admitting: Pharmacist

## 2023-04-07 NOTE — Telephone Encounter (Signed)
Attempted to call patient on both numbers x2 today and unable to reach patient, both numbers are disconnected.  Unable to fill out FMLA form as patient has not been seen for follow up and am unsure of patients condition since discussed previously- was having trouble with vision and was unable to reach by our office to let her know about vision appt.   If patient calls clinic PLEASE schedule for an appt with myself or other provider to fill out work forms/FMLA.   Will message congregational RN team to see if we can reach patient to clarify this form.   Burley Saver MD

## 2023-04-08 ENCOUNTER — Telehealth: Payer: Self-pay

## 2023-04-08 NOTE — Telephone Encounter (Signed)
I attempted to reach out to patient on behalf of Dr Miquel Dunn. Patient did mot pick up. Voice message left.  Nicole Cella Kelty Szafran RN BSN PCCN  [facility] [phone]-cell [phone]-office

## 2023-04-09 ENCOUNTER — Other Ambulatory Visit (HOSPITAL_COMMUNITY): Payer: Self-pay

## 2023-04-09 ENCOUNTER — Encounter: Payer: Self-pay | Admitting: Pharmacist

## 2023-04-09 ENCOUNTER — Other Ambulatory Visit: Payer: Self-pay

## 2023-04-09 ENCOUNTER — Ambulatory Visit (INDEPENDENT_AMBULATORY_CARE_PROVIDER_SITE_OTHER): Payer: 59 | Admitting: Pharmacist

## 2023-04-09 VITALS — BP 127/60 | HR 85 | Ht 66.0 in | Wt 173.0 lb

## 2023-04-09 DIAGNOSIS — I10 Essential (primary) hypertension: Secondary | ICD-10-CM | POA: Diagnosis not present

## 2023-04-09 DIAGNOSIS — Z794 Long term (current) use of insulin: Secondary | ICD-10-CM | POA: Diagnosis not present

## 2023-04-09 DIAGNOSIS — E1165 Type 2 diabetes mellitus with hyperglycemia: Secondary | ICD-10-CM | POA: Diagnosis not present

## 2023-04-09 MED ORDER — INSULIN LISPRO (1 UNIT DIAL) 100 UNIT/ML (KWIKPEN)
10.0000 [IU] | PEN_INJECTOR | Freq: Three times a day (TID) | SUBCUTANEOUS | 0 refills | Status: DC
Start: 2023-04-09 — End: 2023-04-09
  Filled 2023-04-09: qty 9, 30d supply, fill #0

## 2023-04-09 MED ORDER — FREESTYLE LIBRE 3 SENSOR MISC
11 refills | Status: DC
  Filled 2023-04-09: qty 2, 28d supply, fill #0

## 2023-04-09 MED ORDER — ATORVASTATIN CALCIUM 40 MG PO TABS
40.0000 mg | ORAL_TABLET | Freq: Every day | ORAL | 0 refills | Status: DC
  Filled 2023-04-09: qty 30, 30d supply, fill #0

## 2023-04-09 MED ORDER — INSULIN GLARGINE-YFGN 100 UNIT/ML ~~LOC~~ SOPN
30.0000 [IU] | PEN_INJECTOR | Freq: Every day | SUBCUTANEOUS | 0 refills | Status: DC
Start: 2023-04-09 — End: 2023-05-03
  Filled 2023-04-09: qty 9, 30d supply, fill #0

## 2023-04-09 MED ORDER — INSULIN GLARGINE-YFGN 100 UNIT/ML ~~LOC~~ SOPN
30.0000 [IU] | PEN_INJECTOR | Freq: Every day | SUBCUTANEOUS | 0 refills | Status: DC
Start: 2023-04-09 — End: 2023-04-09
  Filled 2023-04-09: qty 9, 30d supply, fill #0

## 2023-04-09 MED ORDER — AMLODIPINE BESYLATE 5 MG PO TABS
5.0000 mg | ORAL_TABLET | Freq: Every day | ORAL | 3 refills | Status: DC
  Filled 2023-04-09: qty 30, 30d supply, fill #0

## 2023-04-09 NOTE — Progress Notes (Signed)
S:     Chief Complaint  Patient presents with   Medication Management    Diabetes    62 y.o. female who presents for diabetes evaluation, education, and management. Patient arrives in good spirits and presents without any assistance. Patient is accompanied by husband and speaks [place]. Entire visit was with the assistance of video interpreter, Rulon Eisenmenger #[id number].  Patient was referred and last seen by Primary Care Provider, Dr. Miquel Dunn, on 03/16/23.   PMH is significant for  T2DM, HTN, HLD.  At last visit, assisted patient with new sensor application and connection to [place] 3 reader. Called [facility], [facility], and [facility] to determine the new medication that the patient claims she was started on to substitute the Humalog (insulin lispro). None of the pharmacies had the new medication on file. Instructed patient to hold the new medication till her follow up visit.   Patient reports Diabetes was diagnosed during hospitalization for DKA on 8/17-8/20.   Current diabetes medications include: Semglee (insulin glargine) 30 units once daily, Humalog (insulin lispro) 10 units three times daily with meals (patient has not yet picked them up from the pharmacy) Current hypertension medications include: amlodipine 5 mg once daily Current hyperlipidemia medications include: atorvastatin 40 mg once daily  Patient reports adherence to taking all medications as prescribed.   Do you feel that your medications are working for you? yes Have you been experiencing any side effects to the medications prescribed? no Insurance coverage: [employer]  Patient denies hypoglycemic events.  Patient reports nocturia (nighttime urination).  Patient denies neuropathy (nerve pain). Patient reports visual changes. (Blurry vision that is unchanged from previous). Reassured that her vision will continue to improve gradually.  Patient denies self foot exams.    O:   Review of Systems   All other systems reviewed and are negative.   Physical Exam Constitutional:      Appearance: Normal appearance.  Pulmonary:     Effort: Pulmonary effort is normal.  Neurological:     Mental Status: She is alert.  Psychiatric:        Mood and Affect: Mood normal.        Behavior: Behavior normal.        Thought Content: Thought content normal.        Judgment: Judgment normal.    7 day average blood glucose: 149 mg/dL  Libre3 CGM Download today on 04/09/23 % Time CGM is active: 84% Average Glucose: 149 mg/dL Glucose Management Indicator: 6.9%  Glucose Variability: 29.9% (goal <36%) Time in Goal:  - Time in range 70-180: 77% - Time above range: 23% - Time below range: 0%  Lab Results  Component Value Date   HGBA1C 14.3 (H) 03/06/2023   Vitals:   04/09/23 1024  BP: 127/60  Pulse: 85  SpO2: 100%    Lipid Panel     Component Value Date/Time   LDLDIRECT 125 (H) 03/09/2023 0226    Clinical Atherosclerotic Cardiovascular Disease (ASCVD): No   A/P: Diabetes longstanding currently controlled based on last GMI of 6.9%. Patient is able to verbalize appropriate hypoglycemia management plan. Medication adherence appears good.  As her glucose is tremendously improved we agreed to keep her regimen simple and maintain currently utilized therapy.  - Discontinued Humalog (insulin lispro) 10 units three times daily with meals. - Continued Semglee (insulin glargine) 30 units once daily. -Patient educated on purpose, proper use, and potential adverse effects.  -Extensively discussed pathophysiology of diabetes, recommended lifestyle interventions, dietary  effects on blood sugar control.  -Counseled on s/sx of and management of hypoglycemia.  ASCVD risk - primary prevention in patient with diabetes. Last LDL is 125 not at goal of <16 mg/dL.  -Continued atorvastatin 40 mg once daily.  Hypertension currently nearly controlled with office reading of 126/75 mm Hg. Blood pressure  goal of <130/80 mmHg. Medication adherence good.  - Patient brought the new medication she was told she was being started on by [employer]. It was Coreg (carvedilol) 12.5 mg twice daily. Instructed patient to stop taking this as this was a misfill and was labeled with another name.  Contacted pharmacy to inform them of the misfill.  -Continued amlodipine 5 mg once daily.  Written patient instructions provided. Patient verbalized understanding of treatment plan.  Total time in face to face counseling 33 minutes.    Follow-up:  Pharmacist 11 days for sensor replacement - reeducation (10/1) PCP clinic visit in 9/23 for forms and 11/1 for Dr. Miquel Dunn - PCP follow-up Patient seen with Alesia Banda, PharmD Candidate and Andee Poles, PharmD Candidate.

## 2023-04-09 NOTE — Patient Instructions (Addendum)
Byari byiza cyane Elam Dutch uyu munsi!  Guhindura imiti:  Dufite inyandiko yoherejwe kuri atorvastatin, amlodipine, sensor ya Libre 3, na Semglee (insuline glargine) Jesusita Oka Farumasi ya [facility].  Hagarika Humalog (insuline lispro).  Zana sensor yawe mugukurikirana [place].  Komeza indi miti yose.   Komeza akazi keza hamwe nimirire no gukora siporo. Intego y'ibiryo [employer], imbuto n'inyama zinanutse ([place], [place], [place]). Gerageza kugabanya gufata umunyu urya imboga nshyashya cyangwa zikonje (aho [place]), kwoza imboga zafunzwe [place] yo guteka kandi ntukongere umunyu wongeyeho kumafunguro.  It was great to see you today!  Medication Changes:  We have a sent script for atorvastatin, amlodipine, the Libre 3 sensors, and Semglee (insulin glargine) to the [facility].  Discontinue the Humalog (insulin lispro).  Bring your sensor to your follow up appointment so that we can assist you with application.  Continue all other medication the same.   Keep up the good work with diet and exercise. Aim for a diet full of vegetables, fruit and lean meats (chicken, [place], fish). Try to limit salt intake by eating fresh or frozen vegetables (instead of canned), rinse canned vegetables prior to cooking and do not add any additional salt to meals.

## 2023-04-09 NOTE — Progress Notes (Signed)
Reviewed and agree with Dr Koval's plan.   

## 2023-04-09 NOTE — Assessment & Plan Note (Signed)
Diabetes longstanding currently controlled based on last GMI of 6.9%. Patient is able to verbalize appropriate hypoglycemia management plan. Medication adherence appears good.  As her glucose is tremendously improved we agreed to keep her regimen simple and maintain currently utilized therapy.  - Discontinued Humalog (insulin lispro) 10 units three times daily with meals. - Continued Semglee (insulin glargine) 30 units once daily. -Patient educated on purpose, proper use, and potential adverse effects.  -Extensively discussed pathophysiology of diabetes, recommended lifestyle interventions, dietary effects on blood sugar control.  -Counseled on s/sx of and management of hypoglycemia.

## 2023-04-09 NOTE — Assessment & Plan Note (Signed)
Hypertension currently nearly controlled with office reading of 126/75 mm Hg. Blood pressure goal of <130/80 mmHg. Medication adherence good.  - Patient brought the new medication she was told she was being started on by [employer]. It was Coreg (carvedilol) 12.5 mg twice daily. Instructed patient to stop taking this as this was a misfill and was labeled with another name.  Contacted pharmacy to inform them of the misfill.  -Continued amlodipine 5 mg once daily.

## 2023-04-12 ENCOUNTER — Ambulatory Visit (INDEPENDENT_AMBULATORY_CARE_PROVIDER_SITE_OTHER): Payer: 59 | Admitting: Student

## 2023-04-12 VITALS — BP 133/71 | HR 96 | Ht 66.0 in | Wt 174.0 lb

## 2023-04-12 DIAGNOSIS — H538 Other visual disturbances: Secondary | ICD-10-CM | POA: Diagnosis not present

## 2023-04-12 NOTE — Progress Notes (Signed)
Reviewed and agree with Dr Koval's plan.   

## 2023-04-12 NOTE — Patient Instructions (Signed)
It was great to see you! Thank you for allowing me to participate in your care!   I recommend that you always bring your medications to each appointment as this makes it easy to ensure we are on the correct medications and helps [place] not miss when refills are needed.  Our plans for today:  - Dr. Miquel Dunn will fax your FMLA paper work - You may return to work  Take care and seek immediate care sooner if you develop any concerns. Please remember to show up 15 minutes before your scheduled appointment time!  Tiffany Kocher, DO [facility]

## 2023-04-12 NOTE — Progress Notes (Signed)
    SUBJECTIVE:   CHIEF COMPLAINT / HPI:   **In-person Kinyar[place]anda interpreter used during encounter**  Blurry vision Patient had ongoing blurry vision believed to be sequelae of her type 2 diabetes.  She had received FMLA paperwork for absence until improved.  She did not make her eye appointment on 03/26/2023. Today patient states blurry vision has completely resolved.  She has no other systemic symptoms.  She reports that she has gone back to work a couple of times, and has been able to do her job without issues.  OBJECTIVE:   BP 133/71   Pulse 96   Ht 5\' 6"  (1.676 m)   Wt 174 lb (78.9 kg)   LMP 09/07/2018   SpO2 100%   BMI 28.08 kg/m    General: NAD, pleasant Eyes: Normal vision testing in all visual field.  PERRL. EOM intact bilaterally.  Conjunctiva normal. Cardio: RRR, no MRG. Cap Refill <2s. Respiratory: CTAB, normal wob on RA Skin: Warm and dry  ASSESSMENT/PLAN:   Assessment & Plan Blurry vision, bilateral Resolved blurry vision. Continue routine care with PCP. Okay to return to work without restrictions. FMLA paperwork to be completed by PCP and faxed to patient's work. Patient will be able to pick up copy.  Tiffany Kocher, DO [facility]

## 2023-04-12 NOTE — Telephone Encounter (Signed)
Pt in appt today with Dr Claudean Severance, [facility] paperwork completed.  Placed in RN box to be faxed to company and to make a copy of form left up front for patient.   Burley Saver MD

## 2023-04-12 NOTE — Telephone Encounter (Signed)
Form faxed to provided number.  Attempted to call number on file, number appears to have been disconnected.   Will place copy at the front desk for pick up.   Copy made and placed in batch scanning.   Veronda Prude, RN

## 2023-04-15 ENCOUNTER — Ambulatory Visit: Payer: 59 | Admitting: Pharmacist

## 2023-04-16 ENCOUNTER — Telehealth: Payer: Self-pay | Admitting: Family Medicine

## 2023-04-16 NOTE — Telephone Encounter (Signed)
Patient dropped off return to work form to be completed. Last DOS was 04/12/23. Placed in [employer].

## 2023-04-16 NOTE — Telephone Encounter (Signed)
Reviewed form and placed in PCP's box for completion.  .Dontrelle Mazon R Damoni Erker, CMA  

## 2023-04-20 ENCOUNTER — Ambulatory Visit: Payer: 59 | Admitting: Pharmacist

## 2023-04-20 ENCOUNTER — Encounter: Payer: Self-pay | Admitting: Pharmacist

## 2023-04-20 ENCOUNTER — Other Ambulatory Visit (HOSPITAL_COMMUNITY): Payer: Self-pay

## 2023-04-20 VITALS — BP 136/68 | HR 83 | Wt 175.0 lb

## 2023-04-20 DIAGNOSIS — E1165 Type 2 diabetes mellitus with hyperglycemia: Secondary | ICD-10-CM | POA: Diagnosis not present

## 2023-04-20 DIAGNOSIS — Z794 Long term (current) use of insulin: Secondary | ICD-10-CM | POA: Diagnosis not present

## 2023-04-20 NOTE — Patient Instructions (Addendum)
Byari byiza Elam Dutch uyu munsi!  Intego yawe glucose yasomwe iri murwego rwintego 79% yigihe.  Ibyo wasomye 180-250 ni 20% byigihe.   99% by'ibisomwa byawe biri murwego rwiza.   Guhindura imiti: Komeza indi miti yose.   Simbuza sensor yawe muminsi 14.   Rukuruzi rwawe rwiteguye gutora muri farumasi ya [facility].    It was nice to see you today!  Your goal glucose readings are in the target range 79 % of the time.  Your readings of 180-250 are 20% of the time.   99% of your readings are in a good range.   Medication Changes: Continue all other medication the same.   Replace your sensor in 14 days.   Your sensors are ready for pick-up at the [facility] pharmacy.

## 2023-04-20 NOTE — Progress Notes (Signed)
Deborah Sampson     S:     Chief Complaint  Patient presents with   Medication Management    Diabetes - CGM Libre review and replacement   62 y.o. female who 62 y.o. female who presents for diabetes evaluation, education, and management. Patient arrives in good spirits and presents without any assistance. Patient is accompanied by husband and in-person interpreter who speaks [place] - Anunsia.     Patient was referred and last seen by Primary Care Provider, Dr. Miquel Dunn, on 03/16/23.    PMH is significant for  T2DM, HTN, HLD.  At last visit, Libre 3 reader results and changed pharmacies to [facility].    Patient reports Diabetes was diagnosed during hospitalization for DKA on 8/17-8/20.  She now reports improvement in symptoms and feels like she is back to normal.    Current diabetes medications include: Semglee (insulin glargine) 30 units once daily,  Current hypertension medications include: amlodipine 5 mg once daily Current hyperlipidemia medications include: atorvastatin 40 mg once daily   Patient reports adherence to taking all medications as prescribed. Has not yet picked up sensor from pharmacy.   Do you feel that your medications are working for you? yes Have you been experiencing any side effects to the medications prescribed? no Insurance coverage: [employer]   Patient denies hypoglycemic events.   Patient reports less nocturia (nighttime urination).  Patient denies neuropathy (nerve pain). Patient reports visual changes (blurry vision) has improved since last visit.     O:   Review of Systems  All other systems reviewed and are negative.   Physical Exam Constitutional:      Appearance: Normal appearance.  Pulmonary:     Effort: Pulmonary effort is normal.  Neurological:     Mental Status: She is alert.  Psychiatric:        Mood and Affect: Mood normal.        Behavior: Behavior normal.        Thought Content: Thought content normal.         Judgment: Judgment normal.    Libre3 CGM Download today 04/20/2023  % Time CGM is active: 93% Average Glucose: 150 mg/dL Glucose Management Indicator: 6.9  Glucose Variability: 24.0% (goal <36%) Time in Goal:  - Time in range 70-180: 79% - Time above range: 21% - Time below range: 0% Observed patterns:   Lab Results  Component Value Date   HGBA1C 14.3 (H) 03/06/2023  Repeat A1c planned for PCP visit 05/21/2023  Vitals:   04/20/23 1043  BP: 136/68  Pulse: 83  SpO2: 100%    Lipid Panel     Component Value Date/Time   LDLDIRECT 125 (H) 03/09/2023 0226    A/P: Diabetes longstanding currently controlled based on last GMI of 6.9%. Patient is able to verbalize appropriate hypoglycemia management plan. Medication adherence appears good.  As her glucose is tremendously improved we agreed to keep her regimen simple and maintain currently utilized therapy.  - Continued Semglee (insulin glargine) 30 units once daily. -Patient educated on purpose, proper use, and potential adverse effects. -Next A1c anticipated 05/21/2023.   Hypertension currently nearly controlled with office reading of 126/75 mm Hg. Blood pressure goal of <130/80 mmHg. Medication adherence good.  -Continued amlodipine 5 mg once daily.  Written patient instructions provided. Patient verbalized understanding of treatment plan.  Total time in face to face counseling 27 minutes.    Follow-up:  Pharmacist PRN PCP clinic visit in 05/21/2023 Patient seen with [facility]  Rayvon Char, PharmD Candidate and Shona Simpson, PharmD Candidate.

## 2023-04-20 NOTE — Assessment & Plan Note (Signed)
Diabetes longstanding currently controlled based on last GMI of 6.9%. Patient is able to verbalize appropriate hypoglycemia management plan. Medication adherence appears good.  As her glucose is tremendously improved we agreed to keep her regimen simple and maintain currently utilized therapy.  - Continued Semglee (insulin glargine) 30 units once daily. -Patient educated on purpose, proper use, and potential adverse effects.

## 2023-04-22 NOTE — Telephone Encounter (Signed)
Delay in documentation.   On 04/20/23, patient presented to clinic for paperwork. Tresa Endo made copy for patient's chart and provided with original paperwork.   Veronda Prude, RN

## 2023-04-26 NOTE — Progress Notes (Signed)
Reviewed and agree with Dr Koval's plan.   

## 2023-04-28 ENCOUNTER — Other Ambulatory Visit (HOSPITAL_COMMUNITY): Payer: Self-pay

## 2023-04-28 ENCOUNTER — Ambulatory Visit (INDEPENDENT_AMBULATORY_CARE_PROVIDER_SITE_OTHER): Payer: Medicaid Other | Admitting: Student

## 2023-04-28 VITALS — BP 122/72 | HR 69 | Ht 66.0 in | Wt 175.2 lb

## 2023-04-28 DIAGNOSIS — R109 Unspecified abdominal pain: Secondary | ICD-10-CM | POA: Diagnosis not present

## 2023-04-28 DIAGNOSIS — M25552 Pain in left hip: Secondary | ICD-10-CM

## 2023-04-28 LAB — POCT URINALYSIS DIP (MANUAL ENTRY)
Bilirubin, UA: NEGATIVE
Blood, UA: NEGATIVE
Glucose, UA: NEGATIVE mg/dL
Ketones, POC UA: NEGATIVE mg/dL
Leukocytes, UA: NEGATIVE
Nitrite, UA: NEGATIVE
Protein Ur, POC: NEGATIVE mg/dL
Spec Grav, UA: 1.02 (ref 1.010–1.025)
Urobilinogen, UA: 0.2 U/dL
pH, UA: 8.5 — AB (ref 5.0–8.0)

## 2023-04-28 MED ORDER — DICLOFENAC SODIUM 1 % EX GEL
2.0000 g | Freq: Four times a day (QID) | CUTANEOUS | 1 refills | Status: DC
Start: 2023-04-28 — End: 2023-11-17
  Filled 2023-04-28: qty 100, 13d supply, fill #0
  Filled 2023-04-28: qty 350, fill #0

## 2023-04-28 NOTE — Progress Notes (Signed)
    SUBJECTIVE:   CHIEF COMPLAINT / HPI:   Come and go prior pain. Now pain is there. Since Sunday. No radiation to legs. Pain in left LL back and radiates to shoulder sometimes. No medications for pain.   PERTINENT  PMH / PSH: PUD,   OBJECTIVE:   Pulse 69   Wt 175 lb 3.2 oz (79.5 kg)   LMP 09/07/2018   SpO2 98%   BMI 28.28 kg/m    General: NAD, pleasant Cardio: RRR, no MRG. Cap Refill <2s. Respiratory: CTAB, normal wob on RA GI: Abdomen is soft, not tender, not distended. BS present Skin: Warm and dry MSK: Low back: No gross deformity.  F ROM.  No TTP over thoracic/lumbar spinous processes.  Straight leg negative bilaterally.  Normal sensation. Left hip: No gross deformity.  TTP over posterior lateral iliac crest.  F ROM.  5/5 strength, normal sensation. FADIR/FABER negative Ober's test negative 4/5 hip abductor strength  ASSESSMENT/PLAN:   Assessment & Plan Pain of left hip Acute left hip pain, benign physical exam, tenderness over lateral/posterior iliac crest.  Likely muscle strain.  UA negative for signs of infection or stone pathology, and given location lower concern that this is flank pain. - Voltaren gel (PUD history) - Tylenol - Ice/heat - Follow-up in 2-4 weeks if not improving - Consider formal physical therapy if not improving versus imaging.   Tiffany Kocher, DO [facility]

## 2023-04-28 NOTE — Patient Instructions (Signed)
It was great to see you! Thank you for allowing me to participate in your care!   I recommend that you always bring your medications to each appointment as this makes it easy to ensure we are on the correct medications and helps [place] not miss when refills are needed.  Our plans for today:  - Use Voltaren gel where you experience the pain - We will call you if the urinalysis is abnormal - Use ice and heat   Take care and seek immediate care sooner if you develop any concerns. Please remember to show up 15 minutes before your scheduled appointment time!  Tiffany Kocher, DO [facility]

## 2023-05-03 ENCOUNTER — Other Ambulatory Visit: Payer: Self-pay

## 2023-05-03 ENCOUNTER — Ambulatory Visit (INDEPENDENT_AMBULATORY_CARE_PROVIDER_SITE_OTHER): Payer: Medicaid Other | Admitting: Pharmacist

## 2023-05-03 ENCOUNTER — Other Ambulatory Visit (HOSPITAL_COMMUNITY): Payer: Self-pay

## 2023-05-03 DIAGNOSIS — Z794 Long term (current) use of insulin: Secondary | ICD-10-CM

## 2023-05-03 DIAGNOSIS — E1165 Type 2 diabetes mellitus with hyperglycemia: Secondary | ICD-10-CM | POA: Diagnosis not present

## 2023-05-03 MED ORDER — BASAGLAR KWIKPEN 100 UNIT/ML ~~LOC~~ SOPN
30.0000 [IU] | PEN_INJECTOR | SUBCUTANEOUS | 0 refills | Status: DC
Start: 2023-05-03 — End: 2023-05-19
  Filled 2023-05-03: qty 9, 30d supply, fill #0

## 2023-05-03 MED ORDER — BASAGLAR KWIKPEN 100 UNIT/ML ~~LOC~~ SOPN
30.0000 [IU] | PEN_INJECTOR | SUBCUTANEOUS | 0 refills | Status: DC
Start: 2023-05-03 — End: 2023-05-03
  Filled 2023-05-03 (×2): qty 15, 30d supply, fill #0

## 2023-05-03 MED ORDER — INSULIN PEN NEEDLE 31G X 5 MM MISC
1.0000 | 99 refills | Status: DC | PRN
  Filled 2023-05-03: qty 100, 100d supply, fill #0

## 2023-05-03 NOTE — Patient Instructions (Addendum)
  Byari byiza Elam Dutch uyu munsi!  CGM yawe igomba kongera gukora.  Nyamuneka jya kuri Farumasi yubuzima bwa [facility] mu kigo cyubuvuzi cya [address] [employer].  Bazagira ukwezi 1 gutanga insuline twaganiriye.   Guhindura imiti: Kurangiza kugemurwa kwa Semglee - igipimo kimwe nkibice 30 byabanjirije rimwe kumunsi.  Noneho Tangira Basaglar insuline kuri 30 30 rimwe kumunsi.   Byombi ni insuline glargine.   Kurikirana hamwe na Dr. Thomasenia Bottoms mu byumweru bike.   Komeza indi miti yose.   Komeza akazi keza hamwe nimirire no gukora siporo. Intego y'ibiryo [employer], imbuto n'inyama zinanutse (ink[place], [place], [place]). Gerageza kugabanya gufata umunyu urya imboga nshyashya cyangwa zikonje (aho [place]), kwoza imboga zafunzwe [place] yo guteka kandi ntukongere umunyu wongeyeho kumafunguro.    It was nice to see you today!  Your CGM should be working again.  Medication Changes: Finish Current supply of Semglee - same dose as previous 30 units once daily.  Then START Basaglar insulin at 30 units once daily.   Both are insulin glargine.   Follow-up with Dr. Miquel Dunn in a few weeks.   Continue all other medication the same.   Keep up the good work with diet and exercise. Aim for a diet full of vegetables, fruit and lean meats (chicken, [place], fish). Try to limit salt intake by eating fresh or frozen vegetables (instead of canned), rinse canned vegetables prior to cooking and do not add any additional salt to meals.

## 2023-05-03 NOTE — Progress Notes (Signed)
    S:     Chief Complaint  Patient presents with   Medication Management    CGM -    62 y.o. female who presents for diabetes evaluation, education, and management. Patient arrives in good spirits and presents without any assistance. Patient is accompanied by husband and granddaughter. Entire visit conducted with use of video interpreter - GRACE # [phone] / Vernia Buff [phone] Then upon return to office Cyndia Bent.    Patient was not schedule for visit today.  I say patient due to complaint that sensor was peeling off.  Upon review it was still attached and providing readings.  Rather than placing overlay bandage over sensor with 1 day remaining we exchanged her sensor for new sensor and placed overlay over new sensor.   Patient instructed to try and use 5 pen voucher of Basaglar - new supply of insulin glargine   Patient reports she is now out of work and has no insurance.   TIR - 93% GMI 6.4 Variability 22.4%   A/P: Diabetes well controlled currently without health insurance and limited 3 day supply of insulin. Initial instructions for use of [facility] - were unsuccessful due to voucher issues.  Patient returned and new plan created.  Instructed to use [facility]  -Continued basal insulin Basaglar (insulin glargine) at 30 units once daily - Also sent needle tips - new Rx    Written patient instructions provided. Patient verbalized understanding of treatment plan.  Total time in face to face counseling 68 minutes.    Follow-up:  Pharmacist PRN PCP clinic visit in 05/21/2023

## 2023-05-03 NOTE — Assessment & Plan Note (Signed)
Diabetes well controlled currently without health insurance and limited 3 day supply of insulin. Initial instructions for use of [facility] - were unsuccessful due to voucher issues.  Patient returned and new plan created.  Instructed to use [facility]  -Continued basal insulin Basaglar (insulin glargine) at 30 units once daily - Also sent needle tips - new Rx

## 2023-05-04 ENCOUNTER — Encounter: Payer: Self-pay | Admitting: Student

## 2023-05-04 ENCOUNTER — Other Ambulatory Visit: Payer: Self-pay

## 2023-05-04 NOTE — Progress Notes (Signed)
Reviewed and agree with Dr Koval's plan.   

## 2023-05-19 ENCOUNTER — Encounter: Payer: Self-pay | Admitting: Pharmacist

## 2023-05-19 ENCOUNTER — Other Ambulatory Visit: Payer: Self-pay

## 2023-05-19 ENCOUNTER — Ambulatory Visit (INDEPENDENT_AMBULATORY_CARE_PROVIDER_SITE_OTHER): Payer: Medicaid Other | Admitting: Pharmacist

## 2023-05-19 VITALS — Wt 178.4 lb

## 2023-05-19 DIAGNOSIS — I10 Essential (primary) hypertension: Secondary | ICD-10-CM | POA: Diagnosis not present

## 2023-05-19 DIAGNOSIS — E1165 Type 2 diabetes mellitus with hyperglycemia: Secondary | ICD-10-CM

## 2023-05-19 DIAGNOSIS — Z794 Long term (current) use of insulin: Secondary | ICD-10-CM

## 2023-05-19 MED ORDER — AMLODIPINE BESYLATE 5 MG PO TABS
5.0000 mg | ORAL_TABLET | Freq: Every day | ORAL | 3 refills | Status: DC
  Filled 2023-05-19: qty 30, 30d supply, fill #0
  Filled 2023-06-21 (×2): qty 30, 30d supply, fill #1

## 2023-05-19 MED ORDER — ATORVASTATIN CALCIUM 40 MG PO TABS
40.0000 mg | ORAL_TABLET | Freq: Every day | ORAL | 3 refills | Status: DC
  Filled 2023-05-19: qty 30, 30d supply, fill #0
  Filled 2023-06-21: qty 30, 30d supply, fill #1

## 2023-05-19 MED ORDER — FREESTYLE LIBRE 3 SENSOR MISC
11 refills | Status: DC
  Filled 2023-05-19 – 2023-08-04 (×3): qty 2, 28d supply, fill #0
  Filled 2023-09-02: qty 2, 28d supply, fill #1
  Filled 2023-09-30: qty 2, 28d supply, fill #2
  Filled 2023-10-28: qty 2, 28d supply, fill #3
  Filled 2023-11-26: qty 2, 28d supply, fill #4
  Filled 2024-01-03: qty 2, 28d supply, fill #5

## 2023-05-19 MED ORDER — LANTUS SOLOSTAR 100 UNIT/ML ~~LOC~~ SOPN
30.0000 [IU] | PEN_INJECTOR | Freq: Every day | SUBCUTANEOUS | 99 refills | Status: DC
Start: 2023-05-19 — End: 2023-05-21
  Filled 2023-05-19: qty 15, 50d supply, fill #0

## 2023-05-19 NOTE — Assessment & Plan Note (Signed)
Hypertension longstanding  - Continued amlodipine 5 mg once daily

## 2023-05-19 NOTE — Assessment & Plan Note (Signed)
Diabetes longstanding currently controlled.  - Assisted Patient with placement of new Libre 3 CGM and connection to reader. - Instructed Patient to use [facility] and utilize new Medicaid coverage. - Changed from Basaglar (insulin glargine) 30 units once daily to Lantus (insulin glargine) 30 units once daily for insurance purpose.

## 2023-05-19 NOTE — Progress Notes (Signed)
    S:     Chief Complaint  Patient presents with   Medication Management    Libre 3 CGM Placement & Medication Management   62 y.o. female who presents for diabetes evaluation, education, and management. Patient arrives in good spirits and presents without any assistance. Patient is accompanied by husband. Encounter was facilitated with the help of [place] representative, Budd Palmer.   Patient was not scheduled for visit today. Saw Patient to place new Libre 3 CGM. Patient is still out of work, but was recently approved for [facility] Medicaid.   PMH is significant for T2DM, HTN, HLD.   Current diabetes medications include: Basaglar (insulin glargine) 30 units once daily Current hypertension medications include: amlodipine 5 mg once daily - ran out, unknown as to when Current hyperlipidemia medications include: atorvastatin 40 mg once daily - ran out, unknown as to when  Patient reports adherence to taking all medications as prescribed.   O:   Review of Systems  All other systems reviewed and are negative.   Physical Exam Constitutional:      Appearance: Normal appearance.  Pulmonary:     Effort: Pulmonary effort is normal.  Neurological:     Mental Status: She is alert.  Psychiatric:        Mood and Affect: Mood normal.        Behavior: Behavior normal.    Lab Results  Component Value Date   HGBA1C 14.3 (H) 03/06/2023    Lipid Panel     Component Value Date/Time   LDLDIRECT 125 (H) 03/09/2023 0226    A/P: Diabetes longstanding currently controlled.  - Assisted Patient with placement of new Libre 3 CGM and connection to reader. - Instructed Patient to use [facility] and utilize new Medicaid coverage. - Changed from Basaglar (insulin glargine) 30 units once daily to Lantus (insulin glargine) 30 units once daily for insurance purpose.    ASCVD risk - primary prevention in patient with diabetes.  -Continued atorvastatin 40 mg once  daily  Hypertension longstanding  - Continued amlodipine 5 mg once daily  Sent prescriptions for Lantus (insulin glargine), atorvastatin, amlodipine, and Libre 3 CGM to [facility]  Written patient instructions provided. Patient verbalized understanding of treatment plan.  Total time in face to face counseling 21 minutes.    Follow-up:  Pharmacist PRN PCP clinic visit in 05/21/2023 Patient seen with Caprice Beaver, PharmD Candidate and Shona Simpson, PharmD Candidate.

## 2023-05-19 NOTE — Patient Instructions (Addendum)
Byari byiza Elam Dutch uyu munsi!  Intego yawe isukari yamaraso ni 80-130 mbere yo [place] na munsi ya 180 nyuma yo [place].  Roma Kayser imiti:  Peggye Fothergill Imiti muri [address] Hotel manager - Disp[employer]eri y'Ibyiringiro [employer]  Vugana n'abakozi muri farumasi [place] Medicaid  Komeza imiti yose.   Komeza akazi keza hamwe nimirire no gukora siporo. Intego y'ibiryo [employer], imbuto n'inyama zinanutse ([place], [place], [place]). Gerageza kugabanya gufata umunyu urya imboga nshyashya cyangwa zikonje (aho [place]), kwoza imboga zafunzwe [place] yo guteka kandi ntukongereho umunyu wongeyeho kumafunguro.  It was nice to see you today!  Your goal blood sugar is 80-130 before eating and less than 180 after eating.  Medication Changes:  Pick up Medications from [address] - [facility]  Talk to staff at pharmacy about [facility]  Continue all medication the same.   Keep up the good work with diet and exercise. Aim for a diet full of vegetables, fruit and lean meats (chicken, [place], fish). Try to limit salt intake by eating fresh or frozen vegetables (instead of canned), rinse canned vegetables prior to cooking and do not add any additional salt to meals.

## 2023-05-20 ENCOUNTER — Other Ambulatory Visit: Payer: Self-pay

## 2023-05-20 NOTE — Progress Notes (Signed)
Reviewed and agree with Dr Koval's plan.   

## 2023-05-20 NOTE — Progress Notes (Signed)
SUBJECTIVE:   CHIEF COMPLAINT / HPI:   T2DM- just saw Dr Raymondo Band 05/18/23, new Libre placed, changed to Lantus 30 units daily. Does note some low blood sugars in the 60s about twice a week, feels shaky, will eat a cookie and it will come back up. Has Libre reader with her and reviewed that I see this dips, majority of time appears in range. Has not seen eye doctor, no longer has blurry vision, willing to see eye doctor.   HLD- on atorvastatin 40mg  daily. No side effects HTN- on amlodipine 5 mg daily. Took yesterday.   Night sweats- she thinks since June for past four months having night sweats where her face is soaking 2-3 times per week. Not every night. No decreased appetite, nausea, vomiting, weight loss, abdominal pain ( she notes she thinks she has gained weight). Only happens at night, feels like subjective fevers. NO cough, chest pain, shortness of breath, hemoptysis, lymphadenopathy. NO abdominal bloating. NO diarrhea or constipation. No vaginal bleeding, her last menses was 3 years ago and no night sweats noted with that. No recent travel or exposure to sick contacts. No pain anywhere.   Due for mammogram- denies breast pain, breast masses, or breast discharge.  PERTINENT  PMH / PSH: T2DM on insulin, GERD, HTN, pulmonary nodule  OBJECTIVE:   BP 138/76   Pulse 82   Ht 5\' 6"  (1.676 m)   Wt 177 lb 3.2 oz (80.4 kg)   LMP 09/07/2018   SpO2 100%   BMI 28.60 kg/m   General: A&O, NAD HEENT: No sign of trauma, EOM grossly intact Cardiac: RRR, no m/r/g Respiratory: CTAB, normal WOB, no w/c/r GI: Soft, NTTP, non-distended  Extremities: NTTP, no peripheral edema. Neuro: Normal gait, moves all four extremities appropriately. Psych: Appropriate mood and affect  Dermatologic Exam: Nails: No onchomycosis. No nail bed thickening. Callouses: No callouses. No fissures. Web spaces: No macerations or open lesions Redness/Erythema: None  Musculoskeletal Exam: No bunion, hammertoes,  prominent metatarsals, collapsed arch, or previous amputation. Vascular Assessment: Pedal hair growth present. 2+ posterior tibial and dorsalis pedis pulses. Neurologic Exam: 10-gram monofilament exam, R 6/6 and L 6/6.   ASSESSMENT/PLAN:   Assessment & Plan Essential hypertension Repeat BP at goal, continue home amlodipine, consider switching to ARB at follow up appointment for renal protection but started with amlodipine due to trouble with follow up and lab draws Gastroesophageal reflux disease, unspecified whether esophagitis present Continue home protonix Type 2 diabetes mellitus with hyperglycemia, with long-term current use of insulin (HCC) 1 month follow up will be due for repeat A1c Decreased lantus to 26 U to avoid hypogylcemia as she is noted on reader to be mostly in range Foot exam wnl today Declines COVID vaccine Needs eye exam, will call and schedule and call patient with date/time Chronic night sweats For past four months per patient report- without other associated symptoms  Lab work done today including Angela Burke, she denies recent travel or sick contacts Discussed importance of being up to date on age related cancer screenings- screening mammogram scheduled and she is agreeable to date/time/location and no symptoms of breast pain masses or discharge Consider menopause but her menses stopped 3 years ago so timeline doesn't fit Pending lab results and mammogram consider further imaging. Encounter for screening mammogram for breast cancer Pt scheduled and given date, time and location of mammogram for next week Discussed importance in light of her night sweats to be up to date on all cancer screenigns  F/u 1 month for A1c, and will call with results of mammogram and lab work to discuss next steps   Billey Co, MD [facility]

## 2023-05-21 ENCOUNTER — Other Ambulatory Visit (HOSPITAL_COMMUNITY): Payer: Self-pay

## 2023-05-21 ENCOUNTER — Encounter: Payer: Self-pay | Admitting: Family Medicine

## 2023-05-21 ENCOUNTER — Ambulatory Visit (INDEPENDENT_AMBULATORY_CARE_PROVIDER_SITE_OTHER): Payer: Medicaid Other | Admitting: Family Medicine

## 2023-05-21 VITALS — BP 138/76 | HR 82 | Ht 66.0 in | Wt 177.2 lb

## 2023-05-21 DIAGNOSIS — K219 Gastro-esophageal reflux disease without esophagitis: Secondary | ICD-10-CM | POA: Diagnosis not present

## 2023-05-21 DIAGNOSIS — Z1231 Encounter for screening mammogram for malignant neoplasm of breast: Secondary | ICD-10-CM | POA: Diagnosis not present

## 2023-05-21 DIAGNOSIS — E1165 Type 2 diabetes mellitus with hyperglycemia: Secondary | ICD-10-CM

## 2023-05-21 DIAGNOSIS — I10 Essential (primary) hypertension: Secondary | ICD-10-CM

## 2023-05-21 DIAGNOSIS — R61 Generalized hyperhidrosis: Secondary | ICD-10-CM

## 2023-05-21 DIAGNOSIS — Z794 Long term (current) use of insulin: Secondary | ICD-10-CM

## 2023-05-21 MED ORDER — LANTUS SOLOSTAR 100 UNIT/ML ~~LOC~~ SOPN
26.0000 [IU] | PEN_INJECTOR | Freq: Every day | SUBCUTANEOUS | 99 refills | Status: DC
Start: 2023-05-21 — End: 2023-07-19
  Filled 2023-05-21: qty 15, 57d supply, fill #0

## 2023-05-21 NOTE — Patient Instructions (Addendum)
It was wonderful to see you today.  Please bring ALL of your medications with you to every visit.   Today we talked about:  - We will decrease your insulin to 26 units daily due the low blood sugars you are having  Keep taking your other medicines  We checked blood work today and will schedule you a mammogram.   If you would like the COVID vaccine let us know.   Thank you for choosing [facility].   Please call [phone] with any questions about today's appointment.  Please arrive at least 15 minutes prior to your scheduled appointments.   If you had blood work today, I will send you a MyChart message or a letter if results are normal. Otherwise, I will give you a call.   If you had a referral placed, they will call you to set up an appointment. Please give [place] a call if you don't hear back in the next 2 weeks.   If you need additional refills before your next appointment, please call your pharmacy first.   Burley Saver, MD  Family Medicine

## 2023-05-21 NOTE — Assessment & Plan Note (Signed)
-   Continue home protonix  

## 2023-05-21 NOTE — Assessment & Plan Note (Addendum)
1 month follow up will be due for repeat A1c Decreased lantus to 26 U to avoid hypogylcemia as she is noted on reader to be mostly in range Foot exam wnl today Declines COVID vaccine Needs eye exam, will call and schedule and call patient with date/time

## 2023-05-21 NOTE — Assessment & Plan Note (Signed)
Repeat BP at goal, continue home amlodipine, consider switching to ARB at follow up appointment for renal protection but started with amlodipine due to trouble with follow up and lab draws

## 2023-05-26 ENCOUNTER — Ambulatory Visit: Payer: Self-pay

## 2023-05-26 LAB — COMPREHENSIVE METABOLIC PANEL
ALT: 23 IU/L (ref 0–32)
AST: 24 IU/L (ref 0–40)
Albumin: 4.3 g/dL (ref 3.8–4.9)
Alkaline Phosphatase: 89 [IU]/L (ref 44–121)
BUN/Creatinine Ratio: 16 (ref 12–28)
BUN: 11 mg/dL (ref 8–27)
Bilirubin Total: 0.3 mg/dL (ref 0.0–1.2)
CO2: 25 mmol/L (ref 20–29)
Calcium: 9.4 mg/dL (ref 8.7–10.3)
Chloride: 105 mmol/L (ref 96–106)
Creatinine, Ser: 0.67 mg/dL (ref 0.57–1.00)
Globulin, Total: 2.7 g/dL (ref 1.5–4.5)
Glucose: 80 mg/dL (ref 70–99)
Potassium: 4.6 mmol/L (ref 3.5–5.2)
Sodium: 143 mmol/L (ref 134–144)
Total Protein: 7 g/dL (ref 6.0–8.5)
eGFR: 100 mL/min/{1.73_m2} (ref >59)

## 2023-05-26 LAB — CBC WITH DIFFERENTIAL/PLATELET
Basophils Absolute: 0 10*3/uL (ref 0.0–0.2)
Basos: 0 %
EOS (ABSOLUTE): 0 10*3/uL (ref 0.0–0.4)
Eos: 0 %
Hematocrit: 43.5 % (ref 34.0–46.6)
Hemoglobin: 13.8 g/dL (ref 11.1–15.9)
Immature Grans (Abs): 0 10*3/uL (ref 0.0–0.1)
Immature Granulocytes: 0 %
Lymphocytes Absolute: 1.8 10*3/uL (ref 0.7–3.1)
Lymphs: 36 %
MCH: 27.6 pg (ref 26.6–33.0)
MCHC: 31.7 g/dL (ref 31.5–35.7)
MCV: 87 fL (ref 79–97)
Monocytes Absolute: 0.3 10*3/uL (ref 0.1–0.9)
Monocytes: 6 %
Neutrophils Absolute: 2.8 10*3/uL (ref 1.4–7.0)
Neutrophils: 58 %
Platelets: 291 10*3/uL (ref 150–450)
RBC: 5 x10E6/uL (ref 3.77–5.28)
RDW: 13.4 % (ref 11.7–15.4)
WBC: 4.8 10*3/uL (ref 3.4–10.8)

## 2023-05-26 LAB — QUANTIFERON-TB GOLD PLUS
QuantiFERON Mitogen Value: >10 [IU]/mL
QuantiFERON Nil Value: 0.1 [IU]/mL
QuantiFERON TB1 Ag Value: 0.01 [IU]/mL
QuantiFERON TB2 Ag Value: 0.01 [IU]/mL
QuantiFERON-TB Gold Plus: NEGATIVE

## 2023-05-26 LAB — C-REACTIVE PROTEIN: CRP: 5 mg/L (ref 0–10)

## 2023-05-26 LAB — HIV ANTIBODY (ROUTINE TESTING W REFLEX): HIV Screen 4th Generation wRfx: NONREACTIVE

## 2023-05-26 LAB — TSH RFX ON ABNORMAL TO FREE T4: TSH: 0.501 u[IU]/mL (ref 0.450–4.500)

## 2023-05-29 ENCOUNTER — Telehealth: Payer: Self-pay

## 2023-05-29 NOTE — Telephone Encounter (Signed)
Pharmacy Patient Advocate Encounter  Received notification from [facility] that Prior Authorization for FREESTYLE LIBRE 3 SENSOR has been APPROVED from 05/29/23 to 11/24/23   PA #/Case ID/Reference #: [id number]

## 2023-05-29 NOTE — Telephone Encounter (Signed)
Pharmacy Patient Advocate Encounter   Received notification from CoverMyMeds that prior authorization for FREESTYLE  LIBRE 3 SENSOR is required/requested.   Insurance verification completed.   The patient is insured through [facility] .   Per test claim: PA required; PA submitted to above mentioned insurance via CoverMyMeds Key/confirmation #/EOC [id number]. Status is pending

## 2023-06-03 ENCOUNTER — Other Ambulatory Visit: Payer: Self-pay

## 2023-06-07 ENCOUNTER — Ambulatory Visit (INDEPENDENT_AMBULATORY_CARE_PROVIDER_SITE_OTHER): Payer: Medicaid Other | Admitting: Pharmacist

## 2023-06-07 ENCOUNTER — Encounter: Payer: Self-pay | Admitting: Pharmacist

## 2023-06-07 VITALS — BP 132/59 | HR 86 | Wt 178.4 lb

## 2023-06-07 DIAGNOSIS — Z794 Long term (current) use of insulin: Secondary | ICD-10-CM | POA: Diagnosis not present

## 2023-06-07 DIAGNOSIS — E1165 Type 2 diabetes mellitus with hyperglycemia: Secondary | ICD-10-CM

## 2023-06-07 NOTE — Progress Notes (Signed)
S:     Chief Complaint  Patient presents with   Medication Management    T2DM - CGM Placement   62 y.o. female who presents for diabetes evaluation, education, and management. Patient arrives in good spirits and presents with Sullivan Lone, a Kinyarwandan interpretor, for assistance. Patient is accompanied by her husband, Louis. Denies swelling in feet. Reports occasional burning sensation on the top of her head.   Patient was referred and last seen by Primary Care Provider, Dr. Miquel Dunn, on 05/21/2023.   PMH is significant for T2DM, HTN, GERD.  At last visit, decreased insulin Lantus (glargine) from 30 units once daily to 26 units once daily due to patient reported hypoglycemia, requiring her to eat.   Current diabetes medications include: amlodipine 5 mg once daily Current hypertension medications include: insulin Lantus (glargine) 26 units once daily Current hyperlipidemia medications include: atorvastatin 40 mg once daily  Patient reports adherence to taking all medications as prescribed.   Do you feel that your medications are working for you? yes Have you been experiencing any side effects to the medications prescribed? no Insurance coverage: [place] Medicaid  Patient denies hypoglycemic events.   Patient reports nocturia (nighttime urination). ~3-4x nightly Patient reports neuropathy (nerve pain). Patient reports vision improvement Patient reports self foot exams.   O:   Review of Systems  All other systems reviewed and are negative.   Physical Exam Vitals reviewed.  Constitutional:      Appearance: Normal appearance.  Pulmonary:     Effort: Pulmonary effort is normal.  Neurological:     Mental Status: She is alert.  Psychiatric:        Mood and Affect: Mood normal.        Behavior: Behavior normal.        Thought Content: Thought content normal.        Judgment: Judgment normal.    7 day average blood glucose: 127  Libre3 CGM Download on 06/02/2023 % Time CGM is  active: 97% Average Glucose: 127 mg/dL Glucose Management Indicator: 6.3  Glucose Variability: 24.9% (goal <36%) Time in Goal:  - Time in range 70-180: 93% - Time above range: 7% - Time below range: 0%  Lab Results  Component Value Date   HGBA1C 14.3 (H) 03/06/2023   Vitals:   06/07/23 0854  BP: (!) 132/59  Pulse: 86  SpO2: 100%    Lipid Panel     Component Value Date/Time   LDLDIRECT 125 (H) 03/09/2023 0226    Clinical Atherosclerotic Cardiovascular Disease (ASCVD): No  The ASCVD Risk score (Arnett DK, et al., 2019) failed to calculate for the following reasons:   Cannot find a previous HDL lab   Cannot find a previous total cholesterol lab   Patient is participating in a Managed Medicaid Plan:  Yes   A/P: Diabetes longstanding currently controlled. Patient, via interpretor, is able to verbalize appropriate hypoglycemia management plan. Medication adherence appears good.  -Continued insulin Lantus (glargine) at 26 units once daily -Assisted placement of Libre3 sensor and connected it to reader -At next appointment, husband, Louis, is willing to place sensor with supervision -A1C at next visit - planned   ASCVD risk - primary prevention in patient with diabetes. Last LDL is 125 not at goal of <59 mg/dL. ASCVD risk factors include T2DM, HTN. -Continued atorvastatin 40 mg once daily - Lipid panel next visit  Hypertension longstanding currently nearly controlled. Blood pressure goal of <130/80 mmHg. Medication adherence good. -Continued amlodipine 5 mg once  daily  Written patient instructions provided. Patient verbalized understanding of treatment plan.  Total time in face to face counseling 23 minutes.    Follow-up:  Pharmacist 06/21/2023 PCP clinic visit in ~1 month Patient seen with Lendon Ka, PharmD Candidate and Shona Simpson, PharmD Candidate.

## 2023-06-07 NOTE — Patient Instructions (Addendum)
Byari byiza Elam Dutch uyu munsi!  Intego yawe isukari yamaraso ni 80-130 mbere yo [place] na munsi ya 180 nyuma yo [place].  Guhindura imiti: KOMEZA insuline Lantus (glargine) ku bice 26 rimwe kumunsi  Komeza indi miti yose.  It was nice to see you today!  Your goal blood sugar is 80-130 before eating and less than 180 after eating.  Medication Changes: CONTINUE insulin Lantus (glargine) at 26 units once daily  Continue all other medication the same.

## 2023-06-07 NOTE — Assessment & Plan Note (Signed)
Diabetes longstanding currently controlled. Patient, via interpretor, is able to verbalize appropriate hypoglycemia management plan. Medication adherence appears good.  -Continued insulin Lantus (glargine) at 26 units once daily -Assisted placement of Libre3 sensor and connected it to reader -At next appointment, husband, Louis, is willing to place sensor with supervision -A1C at next visit - planned

## 2023-06-07 NOTE — Progress Notes (Signed)
Reviewed and agree with Dr Koval's plan.   

## 2023-06-11 ENCOUNTER — Ambulatory Visit: Payer: Self-pay | Admitting: Family Medicine

## 2023-06-14 ENCOUNTER — Other Ambulatory Visit (HOSPITAL_COMMUNITY): Payer: Self-pay

## 2023-06-14 ENCOUNTER — Ambulatory Visit (INDEPENDENT_AMBULATORY_CARE_PROVIDER_SITE_OTHER): Payer: Medicaid Other | Admitting: Student

## 2023-06-14 VITALS — BP 139/74 | HR 78 | Wt 177.6 lb

## 2023-06-14 DIAGNOSIS — R1013 Epigastric pain: Secondary | ICD-10-CM | POA: Diagnosis present

## 2023-06-14 DIAGNOSIS — K279 Peptic ulcer, site unspecified, unspecified as acute or chronic, without hemorrhage or perforation: Secondary | ICD-10-CM | POA: Diagnosis not present

## 2023-06-14 MED ORDER — PANTOPRAZOLE SODIUM 40 MG PO TBEC
40.0000 mg | DELAYED_RELEASE_TABLET | Freq: Every day | ORAL | 0 refills | Status: DC
  Filled 2023-06-14: qty 30, 30d supply, fill #0

## 2023-06-14 NOTE — Progress Notes (Signed)
    SUBJECTIVE:   CHIEF COMPLAINT / HPI: Abdominal Pain  Interpreter used for entire encounter Sugars all in range today  Presents with insomnia, a burning sensation on the head, constipation. The insomnia is reportedly related to when she is laying down flat she reports sensation of saliva in the throat when swallowing, which leads to coughing. This sensation is more pronounced when lying in bed. The patient denies chest pain. The patient denies any abdominal pain but reports a gurgling sound from the abdomen and straining during bowel movements. The patient had two bowel movements today, which were not hard. The patient denies vomiting, polydipsia or polyuria.   She also has burning sensation on the head, which started in November, also contributes to the insomnia.  Denies any trauma to the head.  Denies picking at head or scratching.  Last EGD in Oct 2021- biopsy negative for H pylori (previously treated) and patchy erythematous mucosa noted.  Colonoscopy Oct 2021 with internal hemmorhoids and 2 noncancerous polyps  PERTINENT  PMH / PSH: PUD, T2DM, chronic constipation, fibroids  OBJECTIVE:   BP 139/74   Pulse 78   Wt 177 lb 9.6 oz (80.6 kg)   LMP 09/07/2018   SpO2 100%   BMI 28.67 kg/m   General: Well appearing, NAD, awake, alert, responsive to questions Head: Normocephalic atraumatic CV: Regular rate and rhythm no murmurs rubs or gallops Respiratory: Clear to ausculation bilaterally, no wheezes rales or crackles, chest rises symmetrically,  no increased work of breathing Abdomen: Soft, non-tender, non-distended, normoactive bowel sounds  Extremities: Moves upper and lower extremities freely, no edema in LE  ASSESSMENT/PLAN:   Assessment & Plan PUD (peptic ulcer disease) Symptoms appear to be most related to reflux.  Previously treated for H. pylori.  Previously on Protonix but not on this currently. -Restart Protonix 40 mg daily -Monitor at future visits scheduled on  12/16 with PCP   Burning scalp sensation No lesions on scalp examination.  No history of trauma.  Unclear cause. -Monitor at future visit -Consider addition of topical agent at next visit however wanted to start with 1 medication at a time due to language barrier  Levin Erp, MD [facility]

## 2023-06-14 NOTE — Patient Instructions (Addendum)
It was great to see you! Thank you for allowing me to participate in your care!   Our plans for today:  - Protonix 40 mg daily for your symptoms - Recommend increasing fiber and water intake  Take care and seek immediate care sooner if you develop any concerns.  Levin Erp, MD   Karel Jarvis Lynnell Dike cyane [place]! Urakoze Ruthy Dick Nettie Elm uruhare mukwitaho!   Gahunda zacu z'uyu munsi:  - Protonix 40 mg buri munsi kubimenyetso byawe - Saba kongera fibre n'amazi  Witondere kandi ushakishe ubuvuzi bwihuse niba ufite ibibazo.  Levin Erp, MD

## 2023-06-14 NOTE — Assessment & Plan Note (Signed)
Symptoms appear to be most related to reflux.  Previously treated for H. pylori.  Previously on Protonix but not on this currently. -Restart Protonix 40 mg daily -Monitor at future visits scheduled on 12/16 with PCP

## 2023-06-15 ENCOUNTER — Other Ambulatory Visit: Payer: Self-pay

## 2023-06-21 ENCOUNTER — Encounter: Payer: Self-pay | Admitting: Pharmacist

## 2023-06-21 ENCOUNTER — Other Ambulatory Visit: Payer: Self-pay

## 2023-06-21 ENCOUNTER — Ambulatory Visit (INDEPENDENT_AMBULATORY_CARE_PROVIDER_SITE_OTHER): Payer: Medicaid Other | Admitting: Pharmacist

## 2023-06-21 VITALS — BP 132/67 | HR 83 | Wt 178.2 lb

## 2023-06-21 DIAGNOSIS — E1165 Type 2 diabetes mellitus with hyperglycemia: Secondary | ICD-10-CM | POA: Diagnosis not present

## 2023-06-21 DIAGNOSIS — Z794 Long term (current) use of insulin: Secondary | ICD-10-CM

## 2023-06-21 DIAGNOSIS — I10 Essential (primary) hypertension: Secondary | ICD-10-CM

## 2023-06-21 MED ORDER — AMLODIPINE BESYLATE 5 MG PO TABS
5.0000 mg | ORAL_TABLET | Freq: Every day | ORAL | 3 refills | Status: DC
  Filled 2023-06-21: qty 30, 30d supply, fill #0
  Filled 2023-08-03: qty 30, 30d supply, fill #1
  Filled 2023-09-02: qty 30, 30d supply, fill #2

## 2023-06-21 NOTE — Progress Notes (Signed)
Reviewed and agree with Dr Koval's plan.   

## 2023-06-21 NOTE — Patient Instructions (Addendum)
Byari byiza Elam Dutch uyu munsi!  Intego yawe isukari yamaraso ni 80-130 mbere yo [place] na munsi ya 180 nyuma yo [place].  Gusoma kwawe gukora neza cyane.  Ibintu bitatu (imiti na sensor) bigomba kuba byiteguye gutwarwa muri farumasi yubuvuzi ya [employer].    Guhindura imiti: Komeza indi miti yose.  Kurikirana isukari yamaraso murugo kandi ubike igiti (glucometero cyangwa urupapuro) kugirango uzane nawe mubutaha.  Komeza akazi keza hamwe nimirire no gukora siporo. Intego y'ibiryo [employer], imbuto n'inyama zinanutse ([place], [place], [place]). Gerageza kugabanya gufata umunyu urya imboga nshyashya cyangwa zikonje (aho [place]), kwoza imboga zafunzwe [place] yo guteka kandi ntukongere umunyu wongeyeho kumafunguro.     It was nice to see you today!  Your goal blood sugar is 80-130 before eating and less than 180 after eating.  Your readings are doing very well.  Three items (medications and sensors) should be ready for pick-up at [facility] pharmacy.    Medication Changes: Continue all other medication the same.  Monitor blood sugars at home and keep a log (glucometer or piece of paper) to bring with you to your next visit.  Keep up the good work with diet and exercise. Aim for a diet full of vegetables, fruit and lean meats (chicken, [place], fish). Try to limit salt intake by eating fresh or frozen vegetables (instead of canned), rinse canned vegetables prior to cooking and do not add any additional salt to meals.

## 2023-06-21 NOTE — Assessment & Plan Note (Signed)
Diabetes currently with excellent control. Patient is able to verbalize appropriate hypoglycemia management plan. Medication adherence appears good. No change in treatment plan for diabetes today.  -Continued basal insulin Lantus (insulin glargine)  at 26 units once daily.  -Patient educated on purpose, proper use, and potential adverse effects of hypoglycemia.  -Extensively discussed pathophysiology of diabetes, recommended lifestyle interventions, dietary effects on blood sugar control.  -Counseled on s/sx of and management of hypoglycemia.  -Next A1c anticipated PCP visit in 2 weeks.  -Verified CGM sensors were ready for pick up at pharmacy. - New sensor placed and CGM pairing completed.

## 2023-06-21 NOTE — Assessment & Plan Note (Signed)
Hypertension longstanding currently well controlled. Blood pressure goal of <130/80 mmHg. Medication adherence appears good.  -Continued amlodipine 5 mg daily - resolved prescription redundancy issue with two pharmacies.

## 2023-06-21 NOTE — Progress Notes (Signed)
S:     Chief Complaint  Patient presents with   Medication Management    Diabetes - Medication Access Issue   62 y.o. female who presents for diabetes and hypertension evaluation, education, and management. Patient arrives in good spirits and presents with [employer], a Engineer, materials, for assistance.  Her husband is not in attendance at the visit today.   Patient was referred and last seen by Primary Care Provider, Dr. Laroy Apple, on 06/14/2023.   PMH is significant for Diabetes, Hypertension, and epigastric pain.  At last visit, with Dr. Laroy Apple, PPI therapy was prescribed.    Current diabetes medications include: amlodipine 5 mg once daily Current hypertension medications include: insulin Lantus (glargine) 26 units once daily Current hyperlipidemia medications include: atorvastatin 40 mg once daily  Patient reports adherence to taking all medications as prescribed, however her supply of her amlodipine, atorvastatin and CGM Libre3 sensors have had issues at her pharmacy.   She reports having adequate insulin supply.   Do you feel that your medications are working for you? yes Have you been experiencing any side effects to the medications prescribed? no Do you have any problems obtaining medications due to transportation or finances? Yes - remains unemployed. Insurance coverage: Now has BCBS - Medicaid  Patient denies hypoglycemic events, however reports an episode of dizziness following standing recently.  She did check her glucose and the reading was in the normal range (not low).  Denies any dizziness or orthostatic symptoms currently. Asked to discuss with Dr. Miquel Dunn at next visit in 2 weeks   Patient denies nocturia (nighttime urination).  Patient denies visual changes.  O:   Review of Systems  Neurological:  Positive for dizziness (one episode with standing).    Physical Exam Vitals reviewed.  Constitutional:      Appearance: Normal appearance.  Pulmonary:      Effort: Pulmonary effort is normal.  Neurological:     Mental Status: She is alert.  Psychiatric:        Mood and Affect: Mood normal.        Behavior: Behavior normal.        Thought Content: Thought content normal.    Libre3  CGM Download today 06/21/2023  % Time CGM is active: 96% Average Glucose: 131 mg/dL Glucose Management Indicator: 6.4  Glucose Variability: 23.6% (goal <36%) Time in Goal:  - Time in range 70-180: 93% - Time above range: 7% - Time below range: 0% Observed patterns:   Lab Results  Component Value Date   HGBA1C 14.3 (H) 03/06/2023   Vitals:   06/21/23 1023 06/21/23 1025  BP: (!) 140/68 132/67  Pulse: 83   SpO2: 100%     Lipid Panel     Component Value Date/Time   LDLDIRECT 125 (H) 03/09/2023 0226   Patient is participating in a Managed Medicaid Plan:  Yes   A/P: Diabetes currently with excellent control. Patient is able to verbalize appropriate hypoglycemia management plan. Medication adherence appears good. No change in treatment plan for diabetes today.  -Continued basal insulin Lantus (insulin glargine)  at 26 units once daily.  -Patient educated on purpose, proper use, and potential adverse effects of hypoglycemia.  -Extensively discussed pathophysiology of diabetes, recommended lifestyle interventions, dietary effects on blood sugar control.  -Counseled on s/sx of and management of hypoglycemia.  -Next A1c anticipated PCP visit in 2 weeks.  -Verified CGM sensors were ready for pick up at pharmacy. - New sensor placed and CGM pairing completed.  ASCVD risk - primary prevention in patient with diabetes. Statin indicated.  -Continued atorvastatin 40 mg.  Verified refill ready at pharmacy  Hypertension longstanding currently well controlled. Blood pressure goal of <130/80 mmHg. Medication adherence appears good.  -Continued amlodipine 5 mg daily - resolved prescription redundancy issue with two pharmacies.  Written patient  instructions provided. Patient verbalized understanding of treatment plan.  Total time in face to face counseling 38 minutes.    Follow-up:  Pharmacist PRN PCP clinic visit in 12/16 - I anticipate looking at CGM report with Dr. Miquel Dunn at that visit.

## 2023-07-02 ENCOUNTER — Ambulatory Visit: Payer: Self-pay

## 2023-07-02 ENCOUNTER — Telehealth: Payer: Self-pay

## 2023-07-02 NOTE — Telephone Encounter (Signed)
Patient walks into [facility] requesting to be seen.   She reports she has had intermittent dizziness since 12/5 and worries her blood pressure is high.   Patient scheduled with ATC for this afternoon.   I attempted to call patient multiple times with a [employer] Interpreter, however no answer or option for VM.   Patient does have an apt scheduled already with PCP for 12/16.

## 2023-07-05 ENCOUNTER — Emergency Department (HOSPITAL_COMMUNITY): Payer: Medicaid Other

## 2023-07-05 ENCOUNTER — Emergency Department (HOSPITAL_COMMUNITY)
Admission: EM | Admit: 2023-07-05 | Discharge: 2023-07-05 | Disposition: A | Discharge status: Home / Self Care | Payer: Medicaid Other | Attending: Emergency Medicine | Admitting: Emergency Medicine

## 2023-07-05 ENCOUNTER — Ambulatory Visit: Payer: Self-pay

## 2023-07-05 ENCOUNTER — Telehealth: Payer: Self-pay

## 2023-07-05 ENCOUNTER — Other Ambulatory Visit: Payer: Self-pay

## 2023-07-05 ENCOUNTER — Ambulatory Visit (INDEPENDENT_AMBULATORY_CARE_PROVIDER_SITE_OTHER): Payer: Medicaid Other | Admitting: Family Medicine

## 2023-07-05 ENCOUNTER — Encounter (HOSPITAL_COMMUNITY): Payer: Self-pay

## 2023-07-05 ENCOUNTER — Encounter: Payer: Self-pay | Admitting: Family Medicine

## 2023-07-05 VITALS — BP 140/70 | HR 84 | Ht 66.0 in | Wt 181.6 lb

## 2023-07-05 DIAGNOSIS — Z794 Long term (current) use of insulin: Secondary | ICD-10-CM

## 2023-07-05 DIAGNOSIS — R42 Dizziness and giddiness: Secondary | ICD-10-CM | POA: Insufficient documentation

## 2023-07-05 DIAGNOSIS — E1165 Type 2 diabetes mellitus with hyperglycemia: Secondary | ICD-10-CM

## 2023-07-05 DIAGNOSIS — E785 Hyperlipidemia, unspecified: Secondary | ICD-10-CM

## 2023-07-05 DIAGNOSIS — E1169 Type 2 diabetes mellitus with other specified complication: Secondary | ICD-10-CM

## 2023-07-05 LAB — CBC
HCT: 42.5 % (ref 36.0–46.0)
Hemoglobin: 14 g/dL (ref 12.0–15.0)
MCH: 27.3 pg (ref 26.0–34.0)
MCHC: 32.9 g/dL (ref 30.0–36.0)
MCV: 82.8 fL (ref 80.0–100.0)
Platelets: 310 10*3/uL (ref 150–400)
RBC: 5.13 MIL/uL — ABNORMAL HIGH (ref 3.87–5.11)
RDW: 12.1 % (ref 11.5–15.5)
WBC: 5.1 10*3/uL (ref 4.0–10.5)
nRBC: 0 % (ref 0.0–0.2)

## 2023-07-05 LAB — COMPREHENSIVE METABOLIC PANEL
ALT: 36 U/L (ref 0–44)
AST: 23 U/L (ref 15–41)
Albumin: 3.6 g/dL (ref 3.5–5.0)
Alkaline Phosphatase: 80 U/L (ref 38–126)
Anion gap: 11 (ref 5–15)
BUN: 9 mg/dL (ref 6–20)
CO2: 27 mmol/L (ref 22–32)
Calcium: 9.7 mg/dL (ref 8.9–10.3)
Chloride: 102 mmol/L (ref 98–111)
Creatinine, Ser: 0.62 mg/dL (ref 0.44–1.00)
GFR, Estimated: >60 mL/min (ref >60)
Glucose, Bld: 135 mg/dL — ABNORMAL HIGH (ref 70–99)
Potassium: 4.7 mmol/L (ref 3.5–5.1)
Sodium: 140 mmol/L (ref 135–145)
Total Bilirubin: 0.6 mg/dL (ref <1.2)
Total Protein: 7.6 g/dL (ref 6.5–8.1)

## 2023-07-05 LAB — POCT GLYCOSYLATED HEMOGLOBIN (HGB A1C): HbA1c, POC (controlled diabetic range): 6.2 % (ref 0.0–7.0)

## 2023-07-05 LAB — LIPASE, BLOOD: Lipase: 29 U/L (ref 11–51)

## 2023-07-05 LAB — TROPONIN I (HIGH SENSITIVITY)
Troponin I (High Sensitivity): <2 ng/L (ref <18)
Troponin I (High Sensitivity): <2 ng/L (ref <18)

## 2023-07-05 MED ORDER — MECLIZINE HCL 50 MG PO TABS: 50.0000 mg | ORAL_TABLET | Freq: Three times a day (TID) | ORAL | 0 refills | Status: DC | PRN

## 2023-07-05 MED ORDER — MECLIZINE HCL 25 MG PO TABS
50.0000 mg | ORAL_TABLET | Freq: Three times a day (TID) | ORAL | 0 refills | Status: DC | PRN
Start: 2023-07-05 — End: 2023-08-27
  Filled 2023-07-05: qty 60, 10d supply, fill #0

## 2023-07-05 MED ORDER — MECLIZINE HCL 25 MG PO TABS
50.0000 mg | ORAL_TABLET | Freq: Once | ORAL | Status: AC
  Administered 2023-07-05: 50 mg via ORAL
  Filled 2023-07-05: qty 2

## 2023-07-05 NOTE — ED Provider Notes (Signed)
[place] EMERGENCY DEPARTMENT AT [facility] Provider Note   CSN: [id number] Arrival date & time: 07/05/23  1240     History Chief Complaint  Patient presents with   Dizziness   Chest Pain    HPI Deborah Sampson is a 62 y.o. female presenting for dizziness episodes. [facility] interpreter used and history received is different than history received in triage.  To me she is endorsing that she is having episodic dizziness which is currently not present.  She had an 8-hour observation in the emergency room and has not been present at any time.  Only happens when she changes position. Currently not present.  Sx present for 2 weeks intermittently.   Patient's recorded medical, surgical, social, medication list and allergies were reviewed in the Snapshot window as part of the initial history.   Review of Systems   Review of Systems  Constitutional:  Negative for chills and fever.  HENT:  Negative for ear pain and sore throat.   Eyes:  Negative for pain and visual disturbance.  Respiratory:  Negative for cough and shortness of breath.   Cardiovascular:  Negative for chest pain and palpitations.  Gastrointestinal:  Negative for abdominal pain and vomiting.  Genitourinary:  Negative for dysuria and hematuria.  Musculoskeletal:  Negative for arthralgias and back pain.  Skin:  Negative for color change and rash.  Neurological:  Positive for dizziness. Negative for seizures and syncope.  All other systems reviewed and are negative.   Physical Exam Updated Vital Signs BP 118/86 (BP Location: Right Arm)   Pulse 75   Temp 98.3 F (36.8 C)   Resp 16   LMP 09/07/2018   SpO2 100%  Physical Exam Vitals and nursing note reviewed.  Constitutional:      General: She is not in acute distress.    Appearance: She is well-developed. She is not ill-appearing or toxic-appearing.  HENT:     Head: Normocephalic and atraumatic.  Eyes:     Extraocular Movements: Extraocular  movements intact.     Conjunctiva/sclera: Conjunctivae normal.     Pupils: Pupils are equal, round, and reactive to light.  Cardiovascular:     Rate and Rhythm: Normal rate and regular rhythm.     Heart sounds: No murmur heard. Pulmonary:     Effort: Pulmonary effort is normal. No respiratory distress.     Breath sounds: Normal breath sounds.  Abdominal:     General: Abdomen is flat. There is no distension.     Palpations: Abdomen is soft.     Tenderness: There is no abdominal tenderness. There is no right CVA tenderness or left CVA tenderness.  Musculoskeletal:        General: No swelling, tenderness, deformity or signs of injury. Normal range of motion.     Cervical back: Normal range of motion and neck supple. No rigidity.  Skin:    General: Skin is warm and dry.  Neurological:     General: No focal deficit present.     Mental Status: She is alert and oriented to person, place, and time. Mental status is at baseline.     Cranial Nerves: No cranial nerve deficit.  Psychiatric:        Mood and Affect: Mood normal.      ED Course/ Medical Decision Making/ A&P    Procedures Procedures   Medications Ordered in ED Medications  meclizine (ANTIVERT) tablet 50 mg (has no administration in time range)   Medical Decision Making:  Deborah Sampson is a 62 y.o. female who presented to the ED today with dizziness detailed above.    Complete initial physical exam performed, notably the patient  was .    I personally performed a HINTS exam Head impulse test: Patient does have a corrective saccade to the left. Nystagmus: Patient does have nystagmus to the left on contralateral directional gaze. Test of skew: No skew. Hints exam localizes to the peripheral nervous system.  Reviewed and confirmed nursing documentation for past medical history, family history, social history.    Initial Assessment:   With the patient's presentation of episodic, intermittent dizziness that is worsened  with motion activity, most likely diagnosis is BPPV. Other diagnoses were considered including (but not limited to) CVA, Labyrinthitis, Vestibular Neuritis, Mnire's disease, occipital migraine, Inner Ear infection. These are considered less likely due to history of present illness and physical exam findings.   This is most consistent with an acute life/limb threatening illness complicated by underlying chronic conditions. Notably, due to duration of symptoms being greater than 4 and half hours, patient is not a candidate for code stroke activation.  Initial Plan:  An MRI was ordered in triage, however patient brought back before MRI was performed. I do not believe that she requires CNS imaging at this time due to consistency of presentation with peripheral vertigo.  Her symptoms are not even present at this time for TIA would be more consistent than CVA and description is not acutely consistent with TIA Screening labs including CBC and Metabolic panel to evaluate for infectious or metabolic etiology of disease.  CXR to evaluate for structural/infectious intrathoracic pathology.  Troponin/EKG to evaluate for cardiac pathology Objective evaluation as below reviewed with plan for reassessment after administration of Meclizine.  Initial Study Results:   Laboratory  All laboratory results reviewed without evidence of clinically relevant pathology.    EKG EKG was reviewed independently. Rate, rhythm, axis, intervals all examined and without medically relevant abnormality. ST segments without concerns for elevations.    Radiology:  All images reviewed independently. Agree with radiology report at this time.   DG Chest 1 View Result Date: 07/05/2023 CLINICAL DATA:  Chest pain EXAM: CHEST  1 VIEW COMPARISON:  None Available. FINDINGS: The heart size and mediastinal contours are within normal limits. No focal airspace consolidation, pleural effusion, or pneumothorax. The visualized skeletal structures  are unremarkable. IMPRESSION: No active disease. Electronically Signed   By: Duanne Guess D.O.   On: 07/05/2023 16:19   .   Reassessment and Plan:   Patient's history of present on his physical exam finding remains most consistent with benign positional paroxysmal vertigo. Not consistent with CVA based on history delivered to me through the room wanted an interpreter. Will treat with meclizine recommend follow-up with PCP. Patient feels very comfortable outpatient discharge given prolonged nature of her visit in the emergency department today. Disposition:  I have considered need for hospitalization, however, considering all of the above, I believe this patient is stable for discharge at this time.  Patient/family educated about specific return precautions for given chief complaint and symptoms.  Patient/family educated about follow-up with PCP.     Patient/family expressed understanding of return precautions and need for follow-up. Patient spoken to regarding all imaging and laboratory results and appropriate follow up for these results. All education provided in verbal form with additional information in written form. Time was allowed for answering of patient questions. Patient discharged.    Emergency Department Medication Summary:  Medications  meclizine (ANTIVERT) tablet 50 mg (has no administration in time range)        Clinical Impression:  1. Vertigo      Discharge   Final Clinical Impression(s) / ED Diagnoses Final diagnoses:  Vertigo    Rx / DC Orders ED Discharge Orders          Ordered    meclizine (ANTIVERT) 50 MG tablet  3 times daily PRN        07/05/23 2059              Glyn Ade, MD 07/05/23 2317

## 2023-07-05 NOTE — ED Provider Triage Note (Signed)
Emergency Medicine Provider Triage Evaluation Note  Nicte Sorber , a 62 y.o. female  was evaluated in triage.  Pt complains of dizziness. Started dec 2. Endorses loss of balance and feeling like she is going to fall every time she stands up. Dizziness is all the time. Also endorsing midsternal chest burning.   Review of Systems  Positive: See above Negative: See above  Physical Exam  BP 123/78   Pulse 72   Temp 98.2 F (36.8 C) (Oral)   Resp 16   LMP 09/07/2018   SpO2 99%  Gen:   Awake, no distress   Resp:  Normal effort  MSK:   Moves extremities without difficulty  Other:    Medical Decision Making  Medically screening exam initiated at 2:04 PM.  Appropriate orders placed.  Wauneta Hanford was informed that the remainder of the evaluation will be completed by another provider, this initial triage assessment does not replace that evaluation, and the importance of remaining in the ED until their evaluation is complete.  Work up started   Gareth Eagle, PA-C 07/05/23 1405

## 2023-07-05 NOTE — Assessment & Plan Note (Signed)
Pt presents with constant vertigo since 06/21/23, which she describes as room spinning when she is at rest and also with position changes, associated with some nausea and abdominal pain but no vomiting. And associated with headache.  She also notes decreased hearing bilaterally, left TM with some effusion but without erythema or signs of infection DDX central stroke due to chronicity versus Menieres versus BPPV, but Dix-Hallpike negative and she is unable to tolerate HINTS exam due to dizziness, due to her multiple risk factors and constant vertigo since Dec 2 with headache and her balance notably off in clinic, concern for CVA and due to language barrier and trouble with follow up discussed ED work up which patient is agreeable Also on differential includes hypoglycemia and DKA, recommend BMP at ED

## 2023-07-05 NOTE — Assessment & Plan Note (Signed)
A1c 6.2 today, some low BG on Libre and likely if not admitted to hospital will follow up about decreased insulin dosing.

## 2023-07-05 NOTE — Patient Instructions (Addendum)
It was wonderful to see you today.  Please bring ALL of your medications with you to every visit.   Today we talked about:  - I am concerned with your constant dizziness that you have had a stroke. WE are sending you to the ED for further evaluation.  Thank you for choosing [facility].   Please call [phone] with any questions about today's appointment.  Please arrive at least 15 minutes prior to your scheduled appointments.   If you had blood work today, I will send you a MyChart message or a letter if results are normal. Otherwise, I will give you a call.   If you had a referral placed, they will call you to set up an appointment. Please give [place] a call if you don't hear back in the next 2 weeks.   If you need additional refills before your next appointment, please call your pharmacy first.   Burley Saver, MD  Family Medicine

## 2023-07-05 NOTE — ED Triage Notes (Signed)
Interpreter used for triage: Pt c.o dizziness x 2 weeks. Denies headache. States she feels like the room is spinning. Pt also c.o burning in her chest.

## 2023-07-05 NOTE — Progress Notes (Signed)
    SUBJECTIVE:   CHIEF COMPLAINT / HPI:   Vertigo- constant, started 06/21/23 and feels like room is spinning. Associated with headache at top of her head, also constant and present today. Denies double vision, blurred vision or vision changes, numbness, weakness, vomiting. But does have nausea and abdominal pain, has been taking her insulin. Denies falls but feels like she is going to fall, also worse when she reaches over her head to try and take something off the shelf she feels dizzy.  T2DM- A1c today 6.2. On lantus 26 U daily. Does have some BG in 60s on her Libre monitor, she notes she feels dizzy something with that.   HLD- last LDL 125, taking atorvastatin 40mg  daily.  HTN- on amlodipine 5 mg daily. Takes daily.   Sw[place]ili interpreter [employer] present for entirety of interview.  OBJECTIVE:   BP (!) 140/70   Pulse 84   Ht 5\' 6"  (1.676 m)   Wt 181 lb 9.6 oz (82.4 kg)   LMP 09/07/2018   SpO2 100%   BMI 29.31 kg/m   General: A&O, NAD HEENT: No sign of trauma, EOMI, PERRL, no nystagmus, R TM with wax but no effusion and intact light reflex, left TM with effusion without erythema Cardiac: RRR, no m/r/g Respiratory: CTAB, normal WOB, no w/c/r GI: Soft, NTTP, non-distended , no guarding or rebound Extremities: NTTP, no peripheral edema. Neuro: holding wall, trouble getting up on exam table due to dizziness Psych: Appropriate mood and affect  Neuro: Memory: Intact . PEERLA. Cranial nerves: II through XII are intact. Sensation normal upper and lower ext's bilaterally. Strength 5/5 in upper and lower ext's bilaterally.  Protator drift normal. DTRs normal at knee bilaterally. Gait unsteady and needing to hold onto things to walk or get up on exam table due to dizziness. Dix Hallpike negative bilaterally without nystagmus but also just notes dizziness with getting up on table and while sitting at rest  ASSESSMENT/PLAN:   Assessment & Plan Vertigo Pt presents with constant vertigo  since 06/21/23, which she describes as room spinning when she is at rest and also with position changes, associated with some nausea and abdominal pain but no vomiting. And associated with headache.  She also notes decreased hearing bilaterally, left TM with some effusion but without erythema or signs of infection DDX central stroke due to chronicity versus Menieres versus BPPV, but Dix-Hallpike negative and she is unable to tolerate HINTS exam due to dizziness, due to her multiple risk factors and constant vertigo since Dec 2 with headache and her balance notably off in clinic, concern for CVA and due to language barrier and trouble with follow up discussed ED work up which patient is agreeable Also on differential includes hypoglycemia and DKA, recommend BMP at ED Type 2 diabetes mellitus with hyperglycemia, with long-term current use of insulin (HCC) A1c 6.2 today, some low BG on Libre and likely if not admitted to hospital will follow up about decreased insulin dosing.     Billey Co, MD [facility]

## 2023-07-05 NOTE — Telephone Encounter (Signed)
Called [facility] and spoke with scheduler Desiree to reschedule patient's mammogram. Appointment is set to Friday, January 10th at 12:40pm Patient advise to wear a 2 piece, no lotions or deodorant.   Drusilla Kanner, CMA

## 2023-07-06 ENCOUNTER — Other Ambulatory Visit: Payer: Self-pay

## 2023-07-06 ENCOUNTER — Other Ambulatory Visit (HOSPITAL_COMMUNITY): Payer: Self-pay

## 2023-07-07 ENCOUNTER — Other Ambulatory Visit (INDEPENDENT_AMBULATORY_CARE_PROVIDER_SITE_OTHER): Payer: Self-pay | Admitting: Pharmacist

## 2023-07-07 NOTE — Progress Notes (Signed)
      Chief Complaint  Patient presents with   Medication Management    Diabetes - CGM   62 y.o. female who presents for diabetes evaluation, education, and management. Patient arrives in good spirits and presents without any assistance. Patient is accompanied by her husband. Louis  Patient arrives without appointment scheduled.   Husband, Louis assisted with CGM placement.   Minimal assistance provided.     Written patient instructions provided. Patient verbalized understanding of treatment plan.  Total time in face to face counseling 14 minutes.    Follow-up:  Pharmacist PRN PCP clinic visit in [facility] 07/16/2023

## 2023-07-07 NOTE — Patient Instructions (Addendum)
Byari byiza Elam Dutch uyu munsi!  Intego yawe isukari yamaraso ni 80-130 mbere yo [place] na munsi ya 180 nyuma yo [place].  Guhindura imiti: Komeza indi miti yose.   Ongera utangire inshinge za insuline buri munsi.   Sura vendredi itaha hamwe na Dr. Laroy Apple 12/27 saa 8:50 AM     It was nice to see you today!  Your goal blood sugar is 80-130 before eating and less than 180 after eating.  Medication Changes: Continue all other medication the same.   Restart your insulin injection daily.   Visit Next Friday with Dr. Laroy Apple 12/27 at 8:50 AM

## 2023-07-09 ENCOUNTER — Telehealth: Payer: Self-pay | Admitting: Family Medicine

## 2023-07-09 NOTE — Progress Notes (Signed)
Reviewed and agree with Dr Koval's plan.   

## 2023-07-09 NOTE — Telephone Encounter (Signed)
Attempted to call patient x2. Not able to leave VM or set up. Also attempted to call emergency contact in chart and call cannot be completed as dialed.  Discharged from ED with BPPV.  If patient calls back: - Plan to decrease insulin to 22 units daily from 26 units as A1c is well controlled and to avoid lower/symptomatic BG  Burley Saver MD

## 2023-07-16 ENCOUNTER — Ambulatory Visit: Payer: Self-pay | Admitting: Student

## 2023-07-19 ENCOUNTER — Ambulatory Visit (INDEPENDENT_AMBULATORY_CARE_PROVIDER_SITE_OTHER): Payer: Medicaid Other | Admitting: Student

## 2023-07-19 ENCOUNTER — Other Ambulatory Visit (HOSPITAL_COMMUNITY): Payer: Self-pay

## 2023-07-19 VITALS — BP 138/81 | HR 82 | Ht 66.0 in | Wt 182.5 lb

## 2023-07-19 DIAGNOSIS — Z794 Long term (current) use of insulin: Secondary | ICD-10-CM | POA: Diagnosis not present

## 2023-07-19 DIAGNOSIS — R42 Dizziness and giddiness: Secondary | ICD-10-CM

## 2023-07-19 DIAGNOSIS — E1165 Type 2 diabetes mellitus with hyperglycemia: Secondary | ICD-10-CM | POA: Diagnosis present

## 2023-07-19 MED ORDER — INSULIN PEN NEEDLE 31G X 5 MM MISC
1.0000 | 99 refills | Status: DC | PRN
  Filled 2023-07-19 (×2): qty 100, 34d supply, fill #0

## 2023-07-19 MED ORDER — LANTUS SOLOSTAR 100 UNIT/ML ~~LOC~~ SOPN
22.0000 [IU] | PEN_INJECTOR | Freq: Every day | SUBCUTANEOUS | 99 refills | Status: DC
  Filled 2023-07-19: qty 15, 60d supply, fill #0
  Filled 2023-10-04: qty 15, 60d supply, fill #1

## 2023-07-19 NOTE — Assessment & Plan Note (Signed)
Resolved with meclizine.  Hearing screen normal today.  Suspect BPPV, versus viral labyrinthitis.  Lower concern for Mnire's given normal hearing. - Continue to monitor - Return precautions discussed

## 2023-07-19 NOTE — Patient Instructions (Addendum)
It was great to see you! Thank you for allowing me to participate in your care!   I recommend that you always bring your medications to each appointment as this makes it easy to ensure we are on the correct medications and helps [place] not miss when refills are needed.  Our plans for today:  -Please take 22 units of Lantus daily -You can continue taking meclizine as needed for dizziness, your vertigo will likely improve over time.  If vertigo does not improve I recommend follow-up. -Please follow-up with Dr. Miquel Dunn in approximately 1 month  Take care and seek immediate care sooner if you develop any concerns. Please remember to show up 15 minutes before your scheduled appointment time!  Tiffany Kocher, DO [facility]

## 2023-07-19 NOTE — Progress Notes (Cosign Needed Addendum)
    SUBJECTIVE:   CHIEF COMPLAINT / HPI:   Dizziness Patient has improved dizziness since her visit in the ED.  She is currently taking meclizine as needed, which improves her dizziness.  She does report some reduced hearing.  Type 2 diabetes Patient had significant decrease in A1c from 14.3 to 6.2, noted 2 weeks ago.  Will decrease basal insulin as recommended by PCP Dr. Miquel Dunn.  Aside from the intermittent dizziness above, she does not have any hypoglycemic events including tachycardia, diaphoresis, nausea and vomiting.   OBJECTIVE:   BP (!) 140/71   Pulse 82   Ht 5\' 6"  (1.676 m)   Wt 182 lb 8 oz (82.8 kg)   LMP 09/07/2018   SpO2 99%   BMI 29.46 kg/m    General: NAD, pleasant Cardio: RRR, no MRG. Cap Refill <2s. Respiratory: CTAB, normal wob on RA Skin: Warm and dry  Hearing screen passed.  ASSESSMENT/PLAN:   Assessment & Plan Type 2 diabetes mellitus with hyperglycemia, with long-term current use of insulin (HCC) Most recent A1c of 6.2.  Decrease basal insulin from 26 units to 22 units daily.  I recommend follow-up in 4 weeks with PCP to evaluate for hypoglycemia and ensure understanding of regimen.  Needs refills of insulin and needles. - Insulin 22 units daily - Follow-up in 4 weeks Vertigo Resolved with meclizine.  Hearing screen normal today.  Suspect BPPV, versus viral labyrinthitis.  Lower concern for Mnire's given normal hearing. - Continue to monitor - Return precautions discussed   Tiffany Kocher, DO [facility]

## 2023-07-19 NOTE — Addendum Note (Signed)
Addended by: Tiffany Kocher on: 07/19/2023 04:46 PM   Modules accepted: Level of Service

## 2023-07-19 NOTE — Assessment & Plan Note (Signed)
Most recent A1c of 6.2.  Decrease basal insulin from 26 units to 22 units daily.  I recommend follow-up in 4 weeks with PCP to evaluate for hypoglycemia and ensure understanding of regimen.  Needs refills of insulin and needles. - Insulin 22 units daily - Follow-up in 4 weeks

## 2023-07-30 ENCOUNTER — Ambulatory Visit: Payer: Self-pay

## 2023-08-03 ENCOUNTER — Other Ambulatory Visit: Payer: Self-pay

## 2023-08-03 ENCOUNTER — Telehealth: Payer: Self-pay | Admitting: Family Medicine

## 2023-08-03 ENCOUNTER — Other Ambulatory Visit (HOSPITAL_COMMUNITY): Payer: Self-pay

## 2023-08-03 NOTE — Telephone Encounter (Signed)
 Spoke with patient.   She has refills for Amlodipine at the pharmacy.   I called the pharmacy for her to ensure she could pick up today.   Patient informed to go to the pharmacy now to pick up prescription.   Patient appreciative.

## 2023-08-03 NOTE — Telephone Encounter (Signed)
 Patient walked in stating she needs her BP and cholesterol medication refilled. Last OV was 07/19/23.    Needs a meter for blood sugar

## 2023-08-04 ENCOUNTER — Other Ambulatory Visit (HOSPITAL_COMMUNITY): Payer: Self-pay

## 2023-08-13 ENCOUNTER — Telehealth: Payer: Self-pay | Admitting: Family Medicine

## 2023-08-13 NOTE — Telephone Encounter (Signed)
Called patient to schedule follow up help with Glucose monitor with doctor Koval.

## 2023-08-27 ENCOUNTER — Other Ambulatory Visit: Payer: Self-pay

## 2023-08-27 ENCOUNTER — Encounter (HOSPITAL_COMMUNITY): Payer: Self-pay | Admitting: Emergency Medicine

## 2023-08-27 ENCOUNTER — Ambulatory Visit (HOSPITAL_COMMUNITY)
Admission: EM | Admit: 2023-08-27 | Discharge: 2023-08-27 | Disposition: A | Discharge status: Home / Self Care | Payer: Medicaid Other | Attending: Emergency Medicine | Admitting: Emergency Medicine

## 2023-08-27 DIAGNOSIS — J069 Acute upper respiratory infection, unspecified: Secondary | ICD-10-CM

## 2023-08-27 HISTORY — DX: Essential (primary) hypertension: I10

## 2023-08-27 HISTORY — DX: Type 2 diabetes mellitus without complications: E11.9

## 2023-08-27 MED ORDER — BENZONATATE 100 MG PO CAPS
100.0000 mg | ORAL_CAPSULE | Freq: Three times a day (TID) | ORAL | 0 refills | Status: DC | PRN
  Filled 2023-08-27: qty 30, 10d supply, fill #0

## 2023-08-27 MED ORDER — PROMETHAZINE-DM 6.25-15 MG/5ML PO SYRP
5.0000 mL | ORAL_SOLUTION | Freq: Four times a day (QID) | ORAL | 0 refills | Status: DC | PRN
  Filled 2023-08-27: qty 240, 12d supply, fill #0

## 2023-08-27 MED ORDER — IBUPROFEN 600 MG PO TABS
600.0000 mg | ORAL_TABLET | Freq: Four times a day (QID) | ORAL | 0 refills | Status: DC | PRN
  Filled 2023-08-27: qty 30, 8d supply, fill #0

## 2023-08-27 MED ORDER — CYCLOBENZAPRINE HCL 10 MG PO TABS
10.0000 mg | ORAL_TABLET | Freq: Two times a day (BID) | ORAL | 0 refills | Status: DC | PRN
  Filled 2023-08-27: qty 20, 10d supply, fill #0

## 2023-08-27 NOTE — ED Triage Notes (Signed)
 Used interpretor  Pt c/o cough, headache, back pain and chest pain that started on Wed. Pt hasn't taken any medications.

## 2023-08-27 NOTE — Discharge Instructions (Addendum)
 Ibuprofen  -- 1 tablet every 6 hours as needed for fever, headache  The tessalon  cough pills can be taken 3x daily.  The promethazine  DM cough syrup can be used up to 4 times daily. If this medication makes you drowsy, take only once before bed.   You can take the muscle relaxer Flexeril  twice daily. It can help with back pain. If the medication makes you drowsy, take only at bed time.  Ibuprofen - Tablet 1 buri masaha atandatu ikenewe [place] ngo ikize indwara y'umuriro, umutwe  Iyi miti ya tessalon  yo gukorora ishobora gufata 3x ku munsi.  Siporo ya Promethazine  DM yo gukorora ishobora gukoreshwa inshuro enye ku munsi. Niba uyu muti ugutera umubyibuho ukabije, fata rimwe gusa [place].   Ushobora gufata umubyibuho ukabije inshuro ebyiri ku munsi. Bishobora kugufasha kubabara umugongo. Niba imiti igutera umubyibuho ukabije, jya ku rubyiniro gusa.

## 2023-08-27 NOTE — ED Provider Notes (Signed)
 [facility]    CSN: [id number] Arrival date & time: 08/27/23  9077      History   Chief Complaint Chief Complaint  Patient presents with   Cough   Headache    HPI Audrea Bolte is a 63 y.o. female.  Medical interpretor used for encounter 3-day history of dry cough, headache, back pain No fever or chills. No NVD or abd pain. Rating pain 5/10 No meds taken yet Husband sick too. No known exposures   Past Medical History:  Diagnosis Date   Acute appendicitis 11/21/2018   Back pain 11/27/2015   Diabetes mellitus without complication (HCC)    GERD (gastroesophageal reflux disease)    H. pylori infection 07/25/2019   Headache 12/15/2016   Hypertension    Palpitations 11/27/2015   Patient speaks only [place]yan Rwandan 07/25/2019   Refugee health examination 11/27/2015   S/P laparoscopic appendectomy 11/21/2018   Scars, cultural scarification 11/27/2015    Patient Active Problem List   Diagnosis Date Noted   Vertigo 07/05/2023   Pulmonary nodule 03/16/2023   Type 2 diabetes mellitus with hyperglycemia (HCC) 03/06/2023   Essential hypertension 01/08/2023   PUD (peptic ulcer disease) 08/10/2019   Postmenopausal bleeding 07/25/2019     07/25/2019   Fibroids 07/25/2019   Gastroesophageal reflux disease 03/08/2019   Chronic constipation 01/24/2016    Past Surgical History:  Procedure Laterality Date   APPENDECTOMY     LAPAROSCOPIC APPENDECTOMY N/A 11/21/2018   Procedure: APPENDECTOMY LAPAROSCOPIC;  Surgeon: Ebbie Cough, MD;  Location: [facility] OR;  Service: General;  Laterality: N/A;    OB History   No obstetric history on file.      Home Medications    Prior to Admission medications   Medication Sig Start Date End Date Taking? Authorizing Provider  benzonatate  (TESSALON ) 100 MG capsule Take 1 capsule (100 mg total) by mouth 3 (three) times daily as needed for cough. 08/27/23  Yes Wing Gfeller, Asberry, PA-C  cyclobenzaprine  (FLEXERIL ) 10 MG tablet Take 1  tablet (10 mg total) by mouth 2 (two) times daily as needed for muscle spasms. 08/27/23  Yes Somalia Segler, Asberry, PA-C  ibuprofen  (ADVIL ) 600 MG tablet Take 1 tablet (600 mg total) by mouth every 6 (six) hours as needed. 08/27/23  Yes Ovie Cornelio, Asberry, PA-C  promethazine -dextromethorphan (PROMETHAZINE -DM) 6.25-15 MG/5ML syrup Take 5 mLs by mouth 4 (four) times daily as needed for cough. 08/27/23  Yes Laasya Peyton, Asberry, PA-C  amLODipine  (NORVASC ) 5 MG tablet Take 1 tablet (5 mg total) by mouth at bedtime. 06/21/23   McDiarmid, Krystal BIRCH, MD  atorvastatin  (LIPITOR) 40 MG tablet Take 1 tablet (40 mg total) by mouth daily. 05/19/23   McDiarmid, Krystal BIRCH, MD  Continuous Glucose Sensor (FREESTYLE LIBRE 3 SENSOR) MISC Place 1 sensor on the skin every 14 days. Use to check glucose continuously 05/19/23   McDiarmid, Krystal BIRCH, MD  diclofenac  Sodium (VOLTAREN  ARTHRITIS PAIN) 1 % GEL Apply 2 grams topically 4 (four) times daily. 04/28/23   Howell Lunger, DO  insulin  glargine (LANTUS  SOLOSTAR) 100 UNIT/ML Solostar Pen Inject 22 Units into the skin daily. 07/19/23   Howell Lunger, DO  Insulin  Pen Needle 31G X 5 MM MISC Use with insulin  07/19/23   Howell Lunger, DO    Family History Family History  Family history unknown: Yes    Social History Social History   Tobacco Use   Smoking status: Former    Current packs/day: 0.00    Types: Cigarettes    Quit date: 04/05/2014  Years since quitting: 9.4   Smokeless tobacco: Never  Vaping Use   Vaping status: Never Used  Substance Use Topics   Alcohol use: Not Currently   Drug use: Never     Allergies   Penicillins   Review of Systems Review of Systems Per HPI  Physical Exam Triage Vital Signs ED Triage Vitals  Encounter Vitals Group     BP 08/27/23 1106 (!) 154/76     Systolic BP Percentile --      Diastolic BP Percentile --      Pulse Rate 08/27/23 1106 85     Resp 08/27/23 1106 16     Temp 08/27/23 1106 98.5 F (36.9 C)     Temp Source 08/27/23 1106  Oral     SpO2 08/27/23 1106 98 %     Weight --      Height --      Head Circumference --      Peak Flow --      Pain Score 08/27/23 1102 5     Pain Loc --      Pain Education --      Exclude from Growth Chart --    No data found.  Updated Vital Signs BP (!) 154/76 (BP Location: Right Arm)   Pulse 85   Temp 98.5 F (36.9 C) (Oral)   Resp 16   LMP 09/07/2018   SpO2 98%    Physical Exam Vitals and nursing note reviewed.  Constitutional:      General: She is not in acute distress.    Appearance: She is not ill-appearing.  HENT:     Right Ear: Tympanic membrane and ear canal normal.     Left Ear: Tympanic membrane and ear canal normal.     Nose: No congestion or rhinorrhea.     Mouth/Throat:     Mouth: Mucous membranes are moist.     Pharynx: Oropharynx is clear. No oropharyngeal exudate or posterior oropharyngeal erythema.  Eyes:     Conjunctiva/sclera: Conjunctivae normal.     Pupils: Pupils are equal, round, and reactive to light.  Cardiovascular:     Rate and Rhythm: Normal rate and regular rhythm.     Pulses: Normal pulses.     Heart sounds: Normal heart sounds.  Pulmonary:     Effort: Pulmonary effort is normal. No respiratory distress.     Breath sounds: Normal breath sounds. No wheezing or rales.  Musculoskeletal:        General: Normal range of motion.     Cervical back: Normal range of motion. No rigidity or tenderness.  Lymphadenopathy:     Cervical: No cervical adenopathy.  Skin:    General: Skin is warm and dry.  Neurological:     Mental Status: She is alert and oriented to person, place, and time.     Motor: No weakness.     Gait: Gait normal.     UC Treatments / Results  Labs (all labs ordered are listed, but only abnormal results are displayed) Labs Reviewed - No data to display  EKG  Radiology No results found.  Procedures Procedures   Medications Ordered in UC Medications - No data to display  Initial Impression / Assessment and  Plan / UC Course  I have reviewed the triage vital signs and the nursing notes.  Pertinent labs & imaging results that were available during my care of the patient were reviewed by me and considered in my medical decision making (see chart  for details).  Afebrile, overall well appearing  3 days of symptoms - out of window for tamiflu and clinic is out of influenza tests anyways. Advised viral etiology, symptomatic care, likely prognosis. Try tessalon , promethazine , ibu, tylenol , and flexeril . Increase fluids. Return precautions   Final Clinical Impressions(s) / UC Diagnoses   Final diagnoses:  Viral URI with cough     Discharge Instructions      Ibuprofen  -- 1 tablet every 6 hours as needed for fever, headache  The tessalon  cough pills can be taken 3x daily.  The promethazine  DM cough syrup can be used up to 4 times daily. If this medication makes you drowsy, take only once before bed.   You can take the muscle relaxer Flexeril  twice daily. It can help with back pain. If the medication makes you drowsy, take only at bed time.  Ibuprofen - Tablet 1 buri masaha atandatu ikenewe [place] ngo ikize indwara y'umuriro, umutwe  Iyi miti ya tessalon  yo gukorora ishobora gufata 3x ku munsi.  Siporo ya Promethazine  DM yo gukorora ishobora gukoreshwa inshuro enye ku munsi. Niba uyu muti ugutera umubyibuho ukabije, fata rimwe gusa [place] yo [place].   Ushobora gufata umubyibuho ukabije inshuro ebyiri ku munsi. Bishobora kugufasha kubabara umugongo. Niba imiti igutera umubyibuho ukabije, jya ku rubyiniro gusa.      ED Prescriptions     Medication Sig Dispense Auth. Provider   promethazine -dextromethorphan (PROMETHAZINE -DM) 6.25-15 MG/5ML syrup Take 5 mLs by mouth 4 (four) times daily as needed for cough. 240 mL Marizol Borror, PA-C   benzonatate  (TESSALON ) 100 MG capsule Take 1 capsule (100 mg total) by mouth 3 (three) times daily as needed for cough. 30 capsule Shirl Ludington, PA-C    cyclobenzaprine  (FLEXERIL ) 10 MG tablet Take 1 tablet (10 mg total) by mouth 2 (two) times daily as needed for muscle spasms. 20 tablet Naria Abbey, PA-C   ibuprofen  (ADVIL ) 600 MG tablet Take 1 tablet (600 mg total) by mouth every 6 (six) hours as needed. 30 tablet Jeryl Wilbourn, Asberry, PA-C      PDMP not reviewed this encounter.   Leyton Magoon, Asberry, PA-C 08/27/23 1222

## 2023-08-31 ENCOUNTER — Ambulatory Visit (HOSPITAL_COMMUNITY)
Admission: EM | Admit: 2023-08-31 | Discharge: 2023-08-31 | Disposition: A | Discharge status: Home / Self Care | Payer: Medicaid Other | Attending: Physician Assistant | Admitting: Physician Assistant

## 2023-08-31 ENCOUNTER — Encounter (HOSPITAL_COMMUNITY): Payer: Self-pay

## 2023-08-31 ENCOUNTER — Telehealth: Payer: Self-pay

## 2023-08-31 DIAGNOSIS — R079 Chest pain, unspecified: Secondary | ICD-10-CM | POA: Diagnosis not present

## 2023-08-31 DIAGNOSIS — I10 Essential (primary) hypertension: Secondary | ICD-10-CM | POA: Diagnosis not present

## 2023-08-31 NOTE — ED Provider Notes (Signed)
[facility]    CSN: [id number] Arrival date & time: 08/31/23  1602      History   Chief Complaint Chief Complaint  Patient presents with   Hypertension    HPI Deborah Sampson is a 63 y.o. female.   HPI  Translation services were provided. Translator: Cancilde [phone]  She states she was concerned about her BP as her heart is "beating so much"  She states it was beating very fast and she states she is feeling better now, especially after the EKG was performed  She denies SOB, chest pain, left arm pain/numbness, jaw pain  She states she does have some pain in her left elbow - in the joint. She states this has been present for some time (2 months)  She reports she also has some back pain but has been prescribed medication for this previously      Past Medical History:  Diagnosis Date   Acute appendicitis 11/21/2018   Back pain 11/27/2015   Diabetes mellitus without complication (HCC)    GERD (gastroesophageal reflux disease)    H. pylori infection 07/25/2019   Headache 12/15/2016   Hypertension    Palpitations 11/27/2015   Patient speaks only [place] 07/25/2019   Refugee health examination 11/27/2015   S/P laparoscopic appendectomy 11/21/2018   Scars, cultural scarification 11/27/2015    Patient Active Problem List   Diagnosis Date Noted   Vertigo 07/05/2023   Pulmonary nodule 03/16/2023   Type 2 diabetes mellitus with hyperglycemia (HCC) 03/06/2023   Essential hypertension 01/08/2023   PUD (peptic ulcer disease) 08/10/2019   Postmenopausal bleeding 07/25/2019     07/25/2019   Fibroids 07/25/2019   Gastroesophageal reflux disease 03/08/2019   Chronic constipation 01/24/2016    Past Surgical History:  Procedure Laterality Date   APPENDECTOMY     LAPAROSCOPIC APPENDECTOMY N/A 11/21/2018   Procedure: APPENDECTOMY LAPAROSCOPIC;  Surgeon: Emelia Loron, MD;  Location: [facility] OR;  Service: General;  Laterality: N/A;    OB History   No  obstetric history on file.      Home Medications    Prior to Admission medications   Medication Sig Start Date End Date Taking? Authorizing Provider  amLODipine (NORVASC) 5 MG tablet Take 1 tablet (5 mg total) by mouth at bedtime. 06/21/23   McDiarmid, Leighton Roach, MD  benzonatate (TESSALON) 100 MG capsule Take 1 capsule (100 mg total) by mouth 3 (three) times daily as needed for cough. 08/27/23   Rising, Rebecca, PA-C  Continuous Glucose Sensor (FREESTYLE LIBRE 3 SENSOR) MISC Place 1 sensor on the skin every 14 days. Use to check glucose continuously 05/19/23   McDiarmid, Leighton Roach, MD  cyclobenzaprine (FLEXERIL) 10 MG tablet Take 1 tablet (10 mg total) by mouth 2 (two) times daily as needed for muscle spasms. 08/27/23   Rising, Lurena Joiner, PA-C  diclofenac Sodium (VOLTAREN ARTHRITIS PAIN) 1 % GEL Apply 2 grams topically 4 (four) times daily. 04/28/23   Tiffany Kocher, DO  ibuprofen (ADVIL) 600 MG tablet Take 1 tablet (600 mg total) by mouth every 6 (six) hours as needed. 08/27/23   Rising, Lurena Joiner, PA-C  insulin glargine (LANTUS SOLOSTAR) 100 UNIT/ML Solostar Pen Inject 22 Units into the skin daily. 07/19/23   Tiffany Kocher, DO  Insulin Pen Needle 31G X 5 MM MISC Use with insulin 07/19/23   Tiffany Kocher, DO    Family History Family History  Family history unknown: Yes    Social History Social History   Tobacco Use  Smoking status: Former    Current packs/day: 0.00    Types: Cigarettes    Quit date: 04/05/2014    Years since quitting: 9.4   Smokeless tobacco: Never  Vaping Use   Vaping status: Never Used  Substance Use Topics   Alcohol use: Not Currently   Drug use: Never     Allergies   Penicillins   Review of Systems Review of Systems  Cardiovascular:  Positive for palpitations.     Physical Exam Triage Vital Signs ED Triage Vitals  Encounter Vitals Group     BP 08/31/23 1716 (!) 145/82     Systolic BP Percentile --      Diastolic BP Percentile --      Pulse Rate  08/31/23 1716 69     Resp 08/31/23 1716 18     Temp 08/31/23 1716 98.9 F (37.2 C)     Temp Source 08/31/23 1716 Oral     SpO2 08/31/23 1716 97 %     Weight --      Height --      Head Circumference --      Peak Flow --      Pain Score 08/31/23 1717 3     Pain Loc --      Pain Education --      Exclude from Growth Chart --    No data found.  Updated Vital Signs BP (!) 145/82 (BP Location: Right Arm)   Pulse 69   Temp 98.9 F (37.2 C) (Oral)   Resp 18   LMP 09/07/2018   SpO2 97%   Visual Acuity Right Eye Distance:   Left Eye Distance:   Bilateral Distance:    Right Eye Near:   Left Eye Near:    Bilateral Near:     Physical Exam Vitals reviewed.  Constitutional:      General: She is awake.     Appearance: Normal appearance. She is well-developed and well-groomed.  HENT:     Head: Normocephalic and atraumatic.  Cardiovascular:     Rate and Rhythm: Normal rate and regular rhythm.     Heart sounds: Normal heart sounds. No murmur heard.    No friction rub. No gallop.  Pulmonary:     Effort: Pulmonary effort is normal.     Breath sounds: Normal breath sounds. No decreased air movement. No decreased breath sounds, wheezing, rhonchi or rales.  Skin:    General: Skin is warm and dry.  Neurological:     General: No focal deficit present.     Mental Status: She is alert and oriented to person, place, and time.  Psychiatric:        Attention and Perception: Attention normal.        Mood and Affect: Mood normal. Affect is flat.        Speech: Speech normal.        Behavior: Behavior is withdrawn. Behavior is cooperative.        Thought Content: Thought content normal.        Judgment: Judgment normal.      UC Treatments / Results  Labs (all labs ordered are listed, but only abnormal results are displayed) Labs Reviewed - No data to display  EKG   Radiology No results found.  Procedures ED EKG  Date/Time: 08/31/2023 5:36 PM  Performed by: Providence Crosby, PA-C Authorized by: Providence Crosby, PA-C   Previous ECG:    Previous ECG:  Compared to current  Similarity:  No change   Comparison ECG info:  07/07/23 Interpretation:    Interpretation: normal   Rate:    ECG rate assessment: normal   Rhythm:    Rhythm: sinus rhythm   ST segments:    ST segments:  Normal  (including critical care time)  Medications Ordered in UC Medications - No data to display  Initial Impression / Assessment and Plan / UC Course  I have reviewed the triage vital signs and the nursing notes.  Pertinent labs & imaging results that were available during my care of the patient were reviewed by me and considered in my medical decision making (see chart for details).      Final Clinical Impressions(s) / UC Diagnoses   Final diagnoses:  Chest pain, unspecified type  Primary hypertension   Acute, new concern Patient presents with nonspecific chest pain that has resolved at time of encounter.  EKG was overall normal and consistent with previous EKG findings from 07/07/2023.  Reviewed ED and return precautions which were also provided in after visit summary.  She does not endorse any continued pain at this time or shortness of breath, palpitations, jaw pain, left arm pain or numbness.  Recommend continued use of her prescribed blood pressure medications and close follow-up with her PCP for ongoing management.     Discharge Instructions      At this time your EKG was normal and you informed us that your chest pain was much better. If your chest pain returns, gets more severe, is associated with shortness of breath or trouble breathing, palpitations, jaw pain, left arm pain, pain that radiates through your chest to the back please go immediately to the emergency room for further evaluation and management.  Please continue to take your prescribed medications as directed.  If you continue to have questions or concerns please go to your primary care provider for  further evaluation and ongoing management.     ED Prescriptions   None    PDMP not reviewed this encounter.   Providence Crosby, PA-C 08/31/23 1610

## 2023-08-31 NOTE — Telephone Encounter (Signed)
Patient presents to clinic requesting walk in/same day appointment for possible elevated BP, headache, and fast heart rate.   She has been experiencing headache and fast heart rate since yesterday.   She denies chest pain or shortness of breath. She does not have BP monitor at home, therefore, has not checked BP.   As we do not have any appointments for today, advised that patient proceed to urgent care for further evaluation.   Scheduled patient for PCP follow up on Friday, 09/03/23.   Patient checked into urgent care as of 1602.  Veronda Prude, RN

## 2023-08-31 NOTE — ED Triage Notes (Signed)
Per interpreter, pt c/o chest felt funny for a few minutes and thinks its her high blood pressure. States hx of same since 01/2023. Denies pain.

## 2023-08-31 NOTE — Discharge Instructions (Addendum)
At this time your EKG was normal and you informed us that your chest pain was much better. If your chest pain returns, gets more severe, is associated with shortness of breath or trouble breathing, palpitations, jaw pain, left arm pain, pain that radiates through your chest to the back please go immediately to the emergency room for further evaluation and management.  Please continue to take your prescribed medications as directed.  If you continue to have questions or concerns please go to your primary care provider for further evaluation and ongoing management.

## 2023-09-02 ENCOUNTER — Other Ambulatory Visit (HOSPITAL_COMMUNITY): Payer: Self-pay

## 2023-09-03 ENCOUNTER — Ambulatory Visit: Payer: Medicaid Other | Admitting: Family Medicine

## 2023-09-03 VITALS — BP 128/64 | HR 86 | Wt 185.2 lb

## 2023-09-03 DIAGNOSIS — R1084 Generalized abdominal pain: Secondary | ICD-10-CM

## 2023-09-03 NOTE — Progress Notes (Deleted)
     SUBJECTIVE:   CHIEF COMPLAINT / HPI:   Abdominal pain & midback pain x 2 weeks No nausea/vomiting  No diarrhea/constipation Sometimes improves for 30 mins Hurts with laying down  Hurts more with not soft foods Sometimes bloating or gassy No recorded fever  Coughs with pain in belly  Some sneezing  Last BM this morning - normal, no blood or black appearance  BM 1-2 times a day, no pain  Using a powder (miralax?) No pain or increased urination, no hematuria   PERTINENT  PMH / PSH: ***  OBJECTIVE:   BP 128/64   Pulse 86   Wt 185 lb 3.2 oz (84 kg)   LMP 09/07/2018   SpO2 98%   BMI 29.89 kg/m   ***  ASSESSMENT/PLAN:   No problem-specific Assessment & Plan notes found for this encounter.     Ivery Quale, MD [facility]

## 2023-09-03 NOTE — Patient Instructions (Addendum)
Urakoze gusura ivuriro uyumunsi - burigihe biradushimisha kukwitaho.  Turimo gukusanya laboratoire zitandukanye uyumunsi kugirango dusuzume ububabare bwigifu. Nzaguhamagara ibisubizo hamwe nintambwe ikurikira. Twagutegetse kandi x-ray yo [place] kuri wewe. Cherie Dark ahantu hepfo [employer] wose kugirango ukore ibi.   [address] [place]: [address] [place], [place], [place] [address] [place]: [phone] [place]: 8:00 am - 5:00 pm  Niba ububabare bwawe bwiyongereye kugeza aho udashobora kwimuka, nyamuneka shakisha ubuvuzi bwihuse.  Shikira igihe icyo ari cyo cyose ufite ibibazo cyangwa ibibazo ushobora kuba ufite - turi hano [place]!  Ivery Quale, MD [facility] [phone]  ___________  Thank you for visiting clinic today - it is always our pleasure to care for you.  We are collecting a few different labs today to assess your stomach pain. I will contact you with the results and next steps. We have also ordered an abdominal x-ray for you. You may go to the location below at any time to get this done.   [facility] Address: [address] [place], [place], [place] [address] Phone: [phone] Hours: 8:00 am - 5:00 pm  If your pain worsens to the point where you cannot move, please seek more immediate care.   Reach out any time with any questions or concerns you may have - we are here for you!  Ivery Quale, MD [facility] [phone]

## 2023-09-03 NOTE — Progress Notes (Signed)
     SUBJECTIVE:   CHIEF COMPLAINT / HPI:   Abdominal pain: Patient reports upper abdominal and mid-back pain over the past 2 weeks.  No nausea, vomiting, diarrhea, constipation.  The pain is relatively constant, sometimes improves for 30 minutes but then returns.  Does not hurt with laying down, hurts more when eating foods that are not soft.  Sometimes bloated.  No recorded fever.  Last bowel movement this morning, was normal without bloody or black appearance.  Has 1-2 BM per day, no pain with passing stool.  No pain with urination, increased frequency, or blood in urine.  No difficulty breathing or chest pain.  PERTINENT  PMH / PSH: PUD, H pylori s/p treatment, T2DM on insulin, chronic constipation, HTN, Lap appendectomy 2020  OBJECTIVE:   LMP 09/07/2018   General: No acute distress. Resting comfortably in chair.  CV: Normal S1/S2. No extra heart sounds. Warm and well-perfused. Pulm: Breathing comfortably on room air. CTAB. No increased WOB. Abd: Soft. Mild-moderate distention. Tender to palpation of epigastric and R sided quadrants. Mild guarding, no rebound. Ambulating independently without issue.  Skin:  Warm, dry. Psych: Pleasant and appropriate.   ASSESSMENT/PLAN:   Assessment & Plan Generalized abdominal pain Most concerning for PUD vs GERD vs gastritis. Less likely pancreatitis or dissection given stable vitals. Tolerating PO well at this time, lower concern for acute abdomen. Last EGD in Oct 2021 with mild stomach erythema. Last Colonoscopy Oct 2021 with noncancerous polyps. Has hx of treated H pylori in 2021.  - CBC, CMP today - Lipase today - H pylori breath test today - Abd XR 2view ordered    Emergent precautions discussed.  Ivery Quale, MD [facility]

## 2023-09-04 LAB — CMP14+EGFR
ALT: 19 [IU]/L (ref 0–32)
AST: 16 [IU]/L (ref 0–40)
Albumin: 4.3 g/dL (ref 3.9–4.9)
Alkaline Phosphatase: 108 [IU]/L (ref 44–121)
BUN/Creatinine Ratio: 18 (ref 12–28)
BUN: 11 mg/dL (ref 8–27)
Bilirubin Total: 0.2 mg/dL (ref 0.0–1.2)
CO2: 27 mmol/L (ref 20–29)
Calcium: 10.1 mg/dL (ref 8.7–10.3)
Chloride: 98 mmol/L (ref 96–106)
Creatinine, Ser: 0.62 mg/dL (ref 0.57–1.00)
Globulin, Total: 3.4 g/dL (ref 1.5–4.5)
Glucose: 98 mg/dL (ref 70–99)
Potassium: 4.6 mmol/L (ref 3.5–5.2)
Sodium: 138 mmol/L (ref 134–144)
Total Protein: 7.7 g/dL (ref 6.0–8.5)
eGFR: 101 mL/min/{1.73_m2} (ref >59)

## 2023-09-04 LAB — CBC
Hematocrit: 41.9 % (ref 34.0–46.6)
Hemoglobin: 14 g/dL (ref 11.1–15.9)
MCH: 27.6 pg (ref 26.6–33.0)
MCHC: 33.4 g/dL (ref 31.5–35.7)
MCV: 83 fL (ref 79–97)
Platelets: 307 10*3/uL (ref 150–450)
RBC: 5.08 x10E6/uL (ref 3.77–5.28)
RDW: 13.1 % (ref 11.7–15.4)
WBC: 5.2 10*3/uL (ref 3.4–10.8)

## 2023-09-04 LAB — H. PYLORI BREATH TEST: H pylori Breath Test: NEGATIVE

## 2023-09-04 LAB — LIPASE: Lipase: 30 U/L (ref 14–72)

## 2023-09-06 ENCOUNTER — Ambulatory Visit
Admission: RE | Admit: 2023-09-06 | Discharge: 2023-09-06 | Disposition: A | Discharge status: Home / Self Care | Payer: Medicaid Other | Source: Ambulatory Visit | Attending: Family Medicine

## 2023-09-06 DIAGNOSIS — R1084 Generalized abdominal pain: Secondary | ICD-10-CM

## 2023-09-07 ENCOUNTER — Ambulatory Visit (INDEPENDENT_AMBULATORY_CARE_PROVIDER_SITE_OTHER): Payer: Medicaid Other | Admitting: Student

## 2023-09-07 ENCOUNTER — Other Ambulatory Visit (HOSPITAL_COMMUNITY): Payer: Self-pay

## 2023-09-07 VITALS — BP 112/72 | HR 73 | Ht 66.0 in | Wt 182.4 lb

## 2023-09-07 DIAGNOSIS — I10 Essential (primary) hypertension: Secondary | ICD-10-CM

## 2023-09-07 DIAGNOSIS — R1084 Generalized abdominal pain: Secondary | ICD-10-CM | POA: Diagnosis present

## 2023-09-07 MED ORDER — AMLODIPINE BESYLATE 5 MG PO TABS
5.0000 mg | ORAL_TABLET | Freq: Every day | ORAL | 1 refills | Status: DC
  Filled 2023-09-07 – 2023-09-30 (×2): qty 90, 90d supply, fill #0

## 2023-09-07 NOTE — Assessment & Plan Note (Addendum)
 Blood pressure well-controlled.  Patient reports good compliance with medication.  Patient request refill of medication.  Refill provided. - Follow-up as needed

## 2023-09-07 NOTE — Progress Notes (Signed)
  SUBJECTIVE:   CHIEF COMPLAINT / HPI:   Stomach Pains  Yesterday she went for imaging, and she is wanting the results. Results not yet available as the study has not been read yet.   PERTINENT  PMH / PSH:   OBJECTIVE:  BP 112/72   Pulse 73   Ht 5\' 6"  (1.676 m)   Wt 182 lb 6.4 oz (82.7 kg)   LMP 09/07/2018   SpO2 100%   BMI 29.44 kg/m  Physical Exam Constitutional:      General: She is not in acute distress.    Appearance: She is well-developed. She is not ill-appearing.  Cardiovascular:     Rate and Rhythm: Normal rate and regular rhythm.     Heart sounds: Normal heart sounds. No murmur heard.    No friction rub. No gallop.  Pulmonary:     Effort: Pulmonary effort is normal. No respiratory distress.     Breath sounds: Normal breath sounds. No stridor. No wheezing, rhonchi or rales.  Chest:     Chest wall: No tenderness.  Neurological:     Mental Status: She is alert.      ASSESSMENT/PLAN:   Assessment & Plan Essential hypertension Blood pressure well-controlled.  Patient reports good compliance with medication.  Patient request refill of medication.  Refill provided. - Follow-up as needed Generalized abdominal pain Patient seen yesterday for abdominal pain, and went for abdominal x-rays, patient's returns to clinic today doing fine at results of x-ray.  X-ray results had not been read yet.  Discussed this with patient.  Told patient the provider who ordered the test will call her with an interpreter when all the results have come back.  Patient was okay with this and appreciative.  Provided medication refill. - Follow-up as needed No follow-ups on file. Bess Kinds, MD 09/07/2023, 10:51 AM PGY-3, [facility]

## 2023-09-07 NOTE — Patient Instructions (Addendum)
 It was great to see you! Thank you for allowing me to participate in your care!  Our plans for today:  - X-ray of abdomen (belly)  The X-ray of your belly that you got yesterday, has not been read yet. When the doctor reads it, they will send a report to the doctor you saw yesterday. She will call you with an interpreter and go over the results.   I've also sent a refill of your amlodipine to your pharmacy.   Take care and seek immediate care sooner if you develop any concerns.   Dr. Bess Kinds, MD [facility]    Byari byiza cyane [place]! Urakoze Ruthy Dick Nettie Elm uruhare mukwitaho!  Laverle Patter zacu z'uyu munsi:  - X-ray yinda (inda)  X-ray yinda yawe wabonye ejo, ntabwo irasomwa. Muganga nibisoma, bazohereza raporo kwa muganga wabonye ejo. Azaguhamagara hamwe numusemuzi hanyuma urebe ibisubizo.   Nohereje kandi kuzuza amlodipine yawe muri farumasi yawe.   Witondere kandi ushakishe ubuvuzi bwihuse niba ufite ibibazo.   Dr. Bess Kinds, MD [place] b[place]

## 2023-09-07 NOTE — Assessment & Plan Note (Addendum)
 Patient seen yesterday for abdominal pain, and went for abdominal x-rays, patient's returns to clinic today doing fine at results of x-ray.  X-ray results had not been read yet.  Discussed this with patient.  Told patient the provider who ordered the test will call her with an interpreter when all the results have come back.  Patient was okay with this and appreciative.  Provided medication refill. - Follow-up as needed

## 2023-09-10 ENCOUNTER — Other Ambulatory Visit: Payer: Self-pay | Admitting: Family Medicine

## 2023-09-10 ENCOUNTER — Other Ambulatory Visit (HOSPITAL_COMMUNITY): Payer: Self-pay

## 2023-09-10 DIAGNOSIS — K279 Peptic ulcer, site unspecified, unspecified as acute or chronic, without hemorrhage or perforation: Secondary | ICD-10-CM

## 2023-09-10 MED ORDER — PANTOPRAZOLE SODIUM 40 MG PO TBEC
40.0000 mg | DELAYED_RELEASE_TABLET | Freq: Every day | ORAL | 0 refills | Status: DC
  Filled 2023-09-10: qty 30, 30d supply, fill #0

## 2023-09-10 NOTE — Progress Notes (Signed)
 Attempted to call patient with interpreter to discuss lab results but unfortunately unable to reach and unable to leave voicemail. Will attempt again next week.   Chart reviewed. Reordered Protonix for hx GERD/PUD. Unclear why this medication was stopped.   Scheduled patient for PCP fu on 10/08/23.

## 2023-09-16 ENCOUNTER — Telehealth: Payer: Self-pay | Admitting: Family Medicine

## 2023-09-16 ENCOUNTER — Other Ambulatory Visit (HOSPITAL_COMMUNITY): Payer: Self-pay

## 2023-09-16 ENCOUNTER — Encounter: Payer: Self-pay | Admitting: Family Medicine

## 2023-09-16 DIAGNOSIS — K279 Peptic ulcer, site unspecified, unspecified as acute or chronic, without hemorrhage or perforation: Secondary | ICD-10-CM

## 2023-09-16 MED ORDER — POLYETHYLENE GLYCOL 3350 17 GM/SCOOP PO POWD
17.0000 g | Freq: Every day | ORAL | 5 refills | Status: DC | PRN
  Filled 2023-09-16: qty 238, 14d supply, fill #0

## 2023-09-16 NOTE — Telephone Encounter (Signed)
 Called patient with interpreter. Discussed recent blood work and xray findings.   Per patient, abdominal pain still the same. No vomiting or diarrhea. Describes constipation. Last BM yesterday. No recent bloody stools.  Communicated Protonix medication available for pickup at pharmacy.   Patient describes medication "flour" that she was told to mix into water, suspect it is miralax. Reports using not every day. Advised daily use of Miralax, with BID use as needed. Sent miralax to pharmacy as well.   Answered patient questions.   Confirmed patient scheduled to see PCP on 10/08/23.

## 2023-09-22 ENCOUNTER — Other Ambulatory Visit (HOSPITAL_COMMUNITY): Payer: Self-pay

## 2023-09-29 ENCOUNTER — Other Ambulatory Visit (HOSPITAL_COMMUNITY): Payer: Self-pay

## 2023-09-30 ENCOUNTER — Other Ambulatory Visit (HOSPITAL_COMMUNITY): Payer: Self-pay

## 2023-10-04 ENCOUNTER — Other Ambulatory Visit (HOSPITAL_COMMUNITY): Payer: Self-pay

## 2023-10-07 NOTE — Progress Notes (Deleted)
    SUBJECTIVE:   CHIEF COMPLAINT / HPI:   Abd pain- protonix and Miralax daily.   PERTINENT  PMH / PSH: ***  OBJECTIVE:   LMP 09/07/2018   General: A&O, NAD HEENT: No sign of trauma, EOM grossly intact Cardiac: RRR, no m/r/g Respiratory: CTAB, normal WOB, no w/c/r GI: Soft, NTTP, non-distended  Extremities: NTTP, no peripheral edema. Neuro: Normal gait, moves all four extremities appropriately. Psych: Appropriate mood and affect   ASSESSMENT/PLAN:   Assessment & Plan      Billey Co, MD [facility]

## 2023-10-08 ENCOUNTER — Ambulatory Visit: Payer: Medicaid Other | Admitting: Family Medicine

## 2023-10-11 ENCOUNTER — Ambulatory Visit (INDEPENDENT_AMBULATORY_CARE_PROVIDER_SITE_OTHER): Payer: Self-pay | Admitting: Student

## 2023-10-11 ENCOUNTER — Other Ambulatory Visit (HOSPITAL_COMMUNITY): Payer: Self-pay

## 2023-10-11 VITALS — BP 128/80 | HR 72 | Wt 174.0 lb

## 2023-10-11 DIAGNOSIS — K219 Gastro-esophageal reflux disease without esophagitis: Secondary | ICD-10-CM | POA: Diagnosis present

## 2023-10-11 DIAGNOSIS — K279 Peptic ulcer, site unspecified, unspecified as acute or chronic, without hemorrhage or perforation: Secondary | ICD-10-CM

## 2023-10-11 MED ORDER — PANTOPRAZOLE SODIUM 40 MG PO TBEC
40.0000 mg | DELAYED_RELEASE_TABLET | Freq: Every day | ORAL | 1 refills | Status: DC
  Filled 2023-10-11: qty 90, 90d supply, fill #0

## 2023-10-11 NOTE — Assessment & Plan Note (Signed)
 Reviewed prior visit note, EGD and colonoscopy. Non-adherence to prescribed pantoprazole has led to symptom recurrence. Long-term management with consistent medication use is required to prevent recurrence. - Refill pantoprazole for six months, with a 90-day supply and one refill. - Instruct her to take pantoprazole nightly. - Educate on the necessity of consistent pantoprazole use for effective symptom control.

## 2023-10-11 NOTE — Progress Notes (Signed)
  SUBJECTIVE:   CHIEF COMPLAINT / HPI:   Abdominal pain: The patient, with a history of chronic stomach inflammation due to reflux, presents for follow-up of abdominal pain. She describes the pain as a burning sensation, primarily at night, which disrupts her sleep. The pain is located in the front and back of the abdomen. She was last seen for this issue a month ago and was prescribed pantoprazole, but she did not pick up the medication. She reports no current pain, but notes that it can occur at any time.Seen on 2/14 for generalized abdominal pain which was suspected to be GERD. H. pylori test negative along with CMP, lipase, CBC.  History of EGD and colonoscopy in 2021. She also reports chronic constipation, which she manages with Miralax.  Denies diarrhea  In person Kinyarwanda interpreter utilized throughout encounter.  PERTINENT  PMH / PSH: HTN, T2DM, GERD, peptic ulcer disease, vertigo  OBJECTIVE:  BP 128/80   Pulse 72   Wt 174 lb (78.9 kg)   LMP 09/07/2018   SpO2 98%   BMI 28.08 kg/m  General: Well-appearing, NAD ABDOMEN: Soft, nontender, normal active bowel sounds.  ASSESSMENT/PLAN:   Assessment & Plan Gastroesophageal reflux disease, unspecified whether esophagitis present Reviewed prior visit note, EGD and colonoscopy. Non-adherence to prescribed pantoprazole has led to symptom recurrence. Long-term management with consistent medication use is required to prevent recurrence. - Refill pantoprazole for six months, with a 90-day supply and one refill. - Instruct her to take pantoprazole nightly. - Educate on the necessity of consistent pantoprazole use for effective symptom control. Return if symptoms worsen or fail to improve. Shelby Mattocks, DO 10/11/2023, 12:54 PM PGY-3, [facility]

## 2023-10-11 NOTE — Patient Instructions (Addendum)
 It was great to see you today! Thank you for choosing [facility] for your primary care.  Uyu munsi twaganiriye: 1. Twaganiriye ku mpamvu zikenewe zo gukomeza pantoprazole yawe.  Yujujwe amezi 6.  Today we addressed: We discussed necessary reasons for continuing your pantoprazole.  Refilled for 6 months.  If you haven't already, sign up for My Chart to have easy access to your labs results, and communication with your primary care physician.  Return if symptoms worsen or fail to improve. Please arrive 15 minutes before your appointment to ensure smooth check in process.  We appreciate your efforts in making this happen.  Thank you for allowing me to participate in your care, Shelby Mattocks, DO 10/11/2023, 12:02 PM PGY-3, [facility]

## 2023-10-28 ENCOUNTER — Other Ambulatory Visit (HOSPITAL_COMMUNITY): Payer: Self-pay

## 2023-11-17 ENCOUNTER — Ambulatory Visit: Admitting: Pharmacist

## 2023-11-17 ENCOUNTER — Encounter: Payer: Self-pay | Admitting: Pharmacist

## 2023-11-17 VITALS — BP 143/72 | HR 85 | Wt 186.2 lb

## 2023-11-17 DIAGNOSIS — I10 Essential (primary) hypertension: Secondary | ICD-10-CM | POA: Diagnosis not present

## 2023-11-17 DIAGNOSIS — E1165 Type 2 diabetes mellitus with hyperglycemia: Secondary | ICD-10-CM | POA: Diagnosis present

## 2023-11-17 DIAGNOSIS — Z794 Long term (current) use of insulin: Secondary | ICD-10-CM | POA: Diagnosis not present

## 2023-11-17 LAB — POCT GLYCOSYLATED HEMOGLOBIN (HGB A1C): HbA1c, POC (controlled diabetic range): 6.3 % (ref 0.0–7.0)

## 2023-11-17 MED ORDER — LANTUS SOLOSTAR 100 UNIT/ML ~~LOC~~ SOPN: 20.0000 [IU] | PEN_INJECTOR | Freq: Every day | SUBCUTANEOUS | Status: DC

## 2023-11-17 NOTE — Patient Instructions (Addendum)
 Byari byiza Crisoforo Dolores uyu munsi!  Intego yawe isukari yamaraso ni 80-130 mbere yo [place] na munsi ya 180 nyuma yo [place].  Guhindura imiti:  Kugabanya Lantus  (insuline glargine) kugeza kuri 20 inshuro imwe kumunsi.  Komeza indi miti yose.   Komeza akazi keza hamwe nimirire no gukora siporo. Intego y'ibiryo [employer], imbuto n'inyama zinanutse (inkoko, [place], [place]elia). Gerageza kugabanya gufata umunyu urya imboga nshyashya cyangwa zikonje (aho [place]), kwoza imboga zafunzwe [place] yo guteka kandi ntukongere umunyu wongeyeho kumafunguro.  It was nice to see you today!  Your goal blood sugar is 80-130 before eating and less than 180 after eating.  Medication Changes:  Decrease Lantus  (insulin  glargine) to 20 units once daily.  Continue all other medication the same.   Keep up the good work with diet and exercise. Aim for a diet full of vegetables, fruit and lean meats (chicken, [place], fish). Try to limit salt intake by eating fresh or frozen vegetables (instead of canned), rinse canned vegetables prior to cooking and do not add any additional salt to meals.

## 2023-11-17 NOTE — Assessment & Plan Note (Signed)
 Diabetes longstanding for 1 year (since 2024) currently well controlled with intermittent and rare episodes of low glucose. Patient is able to verbalize appropriate hypoglycemia management plan. Medication adherence appears to be excellent.  -Decreased dose of basal insulin  Lantus  (insulin  glargine) from 22 to 20 units daily in the morning.  -Extensively discussed pathophysiology of diabetes, recommended lifestyle interventions, dietary effects on blood sugar control.  -Counseled on s/sx of and management of hypoglycemia.

## 2023-11-17 NOTE — Progress Notes (Signed)
 S:     Chief Complaint  Patient presents with   Medication Management    Diabetes - CGM & Insulin    63 y.o. female who presents for diabetes evaluation, education, and management. Patient arrives in good spirits and presents without any assistance. Patient is accompanied by [employer] interpreter Brunetta Capes.   Patient was referred and last seen by Primary Care Provider, Dr. Janae Mclean, on 10/11/2023.   PMH is significant for T2DM, HTN, GERD, chronic constipation.   Patient reports Diabetes was diagnosed in 2024.   Current diabetes medications include: insulin  glargine 22 units daily Current hypertension medications include: amlodipine  5 mg  Patient reports adherence to taking all medications as prescribed.   Do you feel that your medications are working for you? yes Have you been experiencing any side effects to the medications prescribed? yes Do you have any problems obtaining medications due to transportation or finances? no Insurance coverage: [place] Medicaid  Patient reports hypoglycemic events with two events in the last weeks, both on Monday 4/28 (overnight and then evening). She believes the low may be due to reduced intake of fruits in her diet.   Patient denies nocturia (nighttime urination).  Patient denies neuropathy (nerve pain). Patient denies visual changes. Patient reports self foot exams.   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    O:   Review of Systems  All other systems reviewed and are negative.   Physical Exam Constitutional:      Appearance: Normal appearance.  Pulmonary:     Effort: Pulmonary effort is normal.  Neurological:     Mental Status: She is alert.  Psychiatric:        Mood and Affect: Mood normal.        Behavior: Behavior normal.        Thought Content: Thought content normal.        Judgment: Judgment normal.     Libre3 CGM Download  today 11/17/2023 % Time CGM is active: 95% Average Glucose: 141 mg/dL Glucose Management Indicator: 6.7%  Glucose Variability: 24.9% (goal <36%) Time in Goal:  - Time in range 70-180: 85% - Time above range: 15% - Time below range: 0% Observed patterns: Recently has been experiencing lows    Lab Results  Component Value Date   HGBA1C 6.2 07/05/2023   Vitals:   11/17/23 0917  BP: (!) 143/72  Pulse: 85  SpO2: 100%    Lipid Panel     Component Value Date/Time   LDLDIRECT 125 (H) 03/09/2023 0226    Clinical Atherosclerotic Cardiovascular Disease (ASCVD): No  The ASCVD Risk score (Arnett DK, et al., 2019) failed to calculate for the following reasons:   Cannot find a previous HDL lab   Cannot find a previous total cholesterol lab   Patient is participating in a Managed Medicaid Plan:  Yes   A/P: Diabetes longstanding for 1 year (since 2024) currently well controlled with intermittent and rare episodes of low glucose. Patient is able to verbalize appropriate hypoglycemia management plan. Medication adherence appears to be excellent.  -Decreased dose of basal insulin  Lantus  (insulin  glargine) from 22 to 20 units daily in the morning.  -Extensively discussed pathophysiology of diabetes, recommended lifestyle interventions, dietary effects on blood sugar control.  -Counseled on s/sx of and management of hypoglycemia.  -Next A1c anticipated in July 2025.   ASCVD risk - primary prevention in patient with diabetes. Last LDL is 125 not at goal  of <70 mg/dL.  - Consider repeat Lipid panel at next blood draw with PCP visit.  If LDL remains > 70 - statin - Consider statin at future visit  Hypertension longstanding 1 year (since 2024) currently controlled. Blood pressure goal of <130/80 mmHg. Medication adherence is good.  -Continued amlodipine  5 mg.  Written patient instructions provided. Patient verbalized understanding of treatment plan.  Total time in face to face counseling 32  minutes.    Follow-up:  Pharmacist as needed PCP clinic visit in two months  Patient seen with Jeanine Millers, PharmD Candidate.

## 2023-11-17 NOTE — Assessment & Plan Note (Signed)
 Hypertension longstanding 1 year (since 2024) currently controlled. Blood pressure goal of <130/80 mmHg. Medication adherence is good.  -Continued amlodipine  5 mg.

## 2023-11-17 NOTE — Addendum Note (Signed)
 Addended by: Chaz Ronning G on: 11/17/2023 10:15 AM   Modules accepted: Orders

## 2023-11-18 NOTE — Progress Notes (Signed)
 Reviewed and agree with Dr Macky Lower plan.

## 2023-11-26 ENCOUNTER — Other Ambulatory Visit: Payer: Self-pay | Admitting: Student

## 2023-11-26 ENCOUNTER — Telehealth: Payer: Self-pay

## 2023-11-26 ENCOUNTER — Telehealth: Payer: Self-pay | Admitting: Family Medicine

## 2023-11-26 ENCOUNTER — Other Ambulatory Visit: Payer: Self-pay

## 2023-11-26 ENCOUNTER — Other Ambulatory Visit (HOSPITAL_COMMUNITY): Payer: Self-pay

## 2023-11-26 DIAGNOSIS — Z794 Long term (current) use of insulin: Secondary | ICD-10-CM

## 2023-11-26 MED ORDER — BD PEN NEEDLE MICRO U/F 32G X 6 MM MISC
1 refills | Status: DC
  Filled 2023-11-26: qty 100, fill #0

## 2023-11-26 MED ORDER — LANTUS SOLOSTAR 100 UNIT/ML ~~LOC~~ SOPN
20.0000 [IU] | PEN_INJECTOR | Freq: Every day | SUBCUTANEOUS | 3 refills | Status: DC
  Filled 2023-11-26: qty 15, 75d supply, fill #0

## 2023-11-26 MED ORDER — ACCU-CHEK AVIVA PLUS W/DEVICE KIT
PACK | 0 refills | Status: AC
  Filled 2023-11-26: qty 1, fill #0

## 2023-11-26 MED ORDER — ACCU-CHEK AVIVA PLUS VI STRP
ORAL_STRIP | 12 refills | Status: AC
  Filled 2023-11-26: qty 100, fill #0

## 2023-11-26 MED ORDER — ACCU-CHEK SOFTCLIX LANCETS MISC
12 refills | Status: AC
  Filled 2023-11-26: qty 100, fill #0
  Filled 2023-11-29: qty 100, 33d supply, fill #0
  Filled 2023-11-29: qty 100, 30d supply, fill #0

## 2023-11-26 MED ORDER — LANTUS SOLOSTAR 100 UNIT/ML ~~LOC~~ SOPN
20.0000 [IU] | PEN_INJECTOR | Freq: Every day | SUBCUTANEOUS | 3 refills | Status: DC
Start: 2023-11-26 — End: 2024-01-28
  Filled 2023-11-26: qty 15, 75d supply, fill #0

## 2023-11-26 NOTE — Telephone Encounter (Signed)
 Called [employer] and spoke with Angie ID: [id number]  Called patient but she didn't have a voicemail set up.  Called patient's husband and informed him that patient's prescription was sent to [facility]. Did provide patient's husband with the new pharmacy address.  Patient's husband stated that he will pick up the medication Monday and to let the doctor know he said thanks.  Christ Courier, CMA

## 2023-11-26 NOTE — Telephone Encounter (Signed)
 Pharmacy Patient Advocate Encounter   Received notification from CoverMyMeds that prior authorization for FreeStyle Libre 3 Sensor is required/requested.   Insurance verification completed.   The patient is insured through [facility] .   PA required; PA submitted to above mentioned insurance via CoverMyMeds Key/confirmation #/EOC [id number]. Status is pending

## 2023-11-26 NOTE — Telephone Encounter (Signed)
 Patient returned to office after picking up insulin . Patient is stating she needs a meter to check her blood sugar at home.

## 2023-11-26 NOTE — Telephone Encounter (Signed)
 I have resent her Lantus   Dr. Lucrecia Sables you know if she still has the CGM? I will send in a glucometer with supplies as well  Thanks Otho Blitz, MD  [facility] Medicine Teaching Service

## 2023-11-26 NOTE — Telephone Encounter (Signed)
 I have sent glucometer and lantus  to [facility]. Please call patient and let her know.  Otho Blitz, MD  Family Medicine Teaching Service

## 2023-11-26 NOTE — Telephone Encounter (Signed)
 Please disregard

## 2023-11-26 NOTE — Addendum Note (Signed)
 Addended by: Bevin Bucks, Luisdavid Hamblin on: 11/26/2023 12:17 PM   Modules accepted: Orders

## 2023-11-26 NOTE — Addendum Note (Signed)
 Addended by: Bevin Bucks, Anurag Scarfo on: 11/26/2023 02:14 PM   Modules accepted: Orders

## 2023-11-29 ENCOUNTER — Ambulatory Visit: Admitting: Student

## 2023-11-29 ENCOUNTER — Other Ambulatory Visit (HOSPITAL_COMMUNITY): Payer: Self-pay

## 2023-11-29 ENCOUNTER — Other Ambulatory Visit: Payer: Self-pay

## 2023-11-29 ENCOUNTER — Encounter: Payer: Self-pay | Admitting: Student

## 2023-11-29 VITALS — BP 156/80 | HR 91 | Wt 185.4 lb

## 2023-11-29 DIAGNOSIS — Z794 Long term (current) use of insulin: Secondary | ICD-10-CM | POA: Diagnosis not present

## 2023-11-29 DIAGNOSIS — E1165 Type 2 diabetes mellitus with hyperglycemia: Secondary | ICD-10-CM

## 2023-11-29 DIAGNOSIS — I10 Essential (primary) hypertension: Secondary | ICD-10-CM | POA: Diagnosis not present

## 2023-11-29 MED ORDER — AMLODIPINE BESYLATE 5 MG PO TABS: 5.0000 mg | ORAL_TABLET | Freq: Every day | ORAL | Status: DC

## 2023-11-29 MED ORDER — ACCU-CHEK GUIDE TEST VI STRP
ORAL_STRIP | Freq: Three times a day (TID) | 12 refills | Status: AC
  Filled 2023-11-29 (×2): qty 100, 33d supply, fill #0

## 2023-11-29 MED ORDER — ACCU-CHEK GUIDE W/DEVICE KIT
PACK | 0 refills | Status: AC
  Filled 2023-11-29 (×2): qty 1, 30d supply, fill #0

## 2023-11-29 MED ORDER — AMLODIPINE BESYLATE 10 MG PO TABS
10.0000 mg | ORAL_TABLET | Freq: Every day | ORAL | 0 refills | Status: DC
  Filled 2023-11-29: qty 30, 30d supply, fill #0

## 2023-11-29 NOTE — Assessment & Plan Note (Signed)
 A1c 6.3 previously, well-controlled at this time.  Accu-Chek guide strips and device kit sent which appropriately correlates Fr[employer] her insurance.  CGM awaiting insurance authorization.

## 2023-11-29 NOTE — Patient Instructions (Addendum)
 It was great to see you today! Thank you for choosing [facility] for your primary care.  Today we addressed: We sent a glucose check monitor device to the [place] pharmacy that should work with [employer].  In terms of the glucose monitor in your arm, that is going through authorization of your insurance and should be ready in the next couple days. Your blood pressure medication has increased to 10 mg daily.  I need you to follow-up in 2 weeks with Dr. Drue Gerald  If you haven't already, sign up for My Chart to have easy access to your labs results, and communication with your primary care physician.  Return in about 2 weeks (around 12/13/2023) for diabetes and blood pressure. Please arrive 15 minutes before your appointment to ensure smooth check in process.  We appreciate your efforts in making this happen.  Thank you for allowing me to participate in your care, Veronia Goon, DO 11/29/2023, 11:03 AM PGY-3, [facility]

## 2023-11-29 NOTE — Progress Notes (Signed)
  SUBJECTIVE:   CHIEF COMPLAINT / HPI:   Type 2 Diabetes: Home medications include: Lantus  20 units every morning. Does endorse compliance. Home glucose monitoring is not performed. Patient is not up to date on diabetic eye. Recent A1c 2 weeks prior 6.3%. She was unable to obtain monitor because her pharmacy said she does not have Medicaid. She does have Medicaid card. She has felt well otherwise. She ate this morning.   In person Kinyarwanda interpreter used throughout encounter.  PERTINENT  PMH / PSH: HTN, T2DM, GERD w/ PUD, vertigo  OBJECTIVE:  BP (!) 156/80   Pulse 91   Wt 185 lb 6.4 oz (84.1 kg)   LMP 09/07/2018   SpO2 100%   BMI 29.92 kg/m  General: Well-appearing, NAD CV: RRR, no murmurs appreciable Pulm: Normal WOB  ASSESSMENT/PLAN:   Assessment & Plan Type 2 diabetes mellitus with hyperglycemia, with long-term current use of insulin  (HCC) A1c 6.3 previously, well-controlled at this time.  Accu-Chek guide strips and device kit sent which appropriately correlates [place] her insurance.  CGM awaiting insurance authorization. Essential hypertension BP: (!) 156/80 today. Poorly controlled. Goal of <140/90.  Recommended amlodipine  increased to 10 mg daily although patient deferred.  Recommend follow-up in 2 weeks with PCP to rediscuss HTN management. Return in about 2 weeks (around 12/13/2023) for diabetes and blood pressure. Veronia Goon, DO 11/29/2023, 12:42 PM PGY-3, [facility]

## 2023-11-29 NOTE — Assessment & Plan Note (Signed)
 BP: (!) 156/80 today. Poorly controlled. Goal of <140/90.  Recommended amlodipine  increased to 10 mg daily although patient deferred.  Recommend follow-up in 2 weeks with PCP to rediscuss HTN management.

## 2023-11-29 NOTE — Telephone Encounter (Signed)
 Pharmacy Patient Advocate Encounter  Received notification from [facility] that Prior Authorization for FREESTYLE LIBRE 3 SENSORS has been APPROVED from 11/26/23 to 11/25/24

## 2023-12-14 ENCOUNTER — Encounter: Payer: Self-pay | Admitting: Family Medicine

## 2023-12-14 ENCOUNTER — Ambulatory Visit (INDEPENDENT_AMBULATORY_CARE_PROVIDER_SITE_OTHER): Admitting: Family Medicine

## 2023-12-14 ENCOUNTER — Other Ambulatory Visit: Payer: Self-pay

## 2023-12-14 VITALS — BP 142/78 | HR 80 | Ht 66.0 in | Wt 187.0 lb

## 2023-12-14 DIAGNOSIS — E1165 Type 2 diabetes mellitus with hyperglycemia: Secondary | ICD-10-CM

## 2023-12-14 DIAGNOSIS — I10 Essential (primary) hypertension: Secondary | ICD-10-CM

## 2023-12-14 DIAGNOSIS — Z794 Long term (current) use of insulin: Secondary | ICD-10-CM | POA: Diagnosis not present

## 2023-12-14 MED ORDER — AMLODIPINE BESYLATE 10 MG PO TABS
10.0000 mg | ORAL_TABLET | Freq: Every day | ORAL | 3 refills | Status: DC
  Filled 2023-12-14: qty 90, 90d supply, fill #0

## 2023-12-14 NOTE — Assessment & Plan Note (Signed)
 Recent A1c at goal, will work with Engineer, technical sales about scheduling eye exam, declined pneumonia vaccine today

## 2023-12-14 NOTE — Patient Instructions (Signed)
 It was wonderful to see you today.  Please bring ALL of your medications with you to every visit.   Today we talked about:  We will have you take amlodipine  10mg  daily, one pill once a day.   Thank you for choosing [facility].   Please call [phone] with any questions about today's appointment.  Please arrive at least 15 minutes prior to your scheduled appointments.   If you had blood work today, I will send you a MyChart message or a letter if results are normal. Otherwise, I will give you a call.   If you had a referral placed, they will call you to set up an appointment. Please give us  a call if you don't hear back in the next 2 weeks.   If you need additional refills before your next appointment, please call your pharmacy first.   Avanell Bob, MD  Family Medicine

## 2023-12-14 NOTE — Progress Notes (Signed)
    SUBJECTIVE:   CHIEF COMPLAINT / HPI: Swahili in-person interpreter Anise Kerns used for entirety of visit.   HTN- taking amlodipine  5 mg daily, last took today. Denies side effects. Denies headache or chest pain.   T2DM- taking 20 units insulin  daily, denies low blood sugars. Has Dexcom, shows in range or slightly above range. Has not had eye exam yet.   HCM- discussed pneumonia vaccine, she notes will do at next visit. Also due for mammogram.  PERTINENT  PMH / PSH: HTN, T2DM  OBJECTIVE:   BP (!) 142/78   Pulse 80   Ht 5\' 6"  (1.676 m)   Wt 187 lb (84.8 kg)   LMP 09/07/2018   SpO2 100%   BMI 30.18 kg/m   General: A&O, NAD HEENT: No sign of trauma, EOM grossly intact Cardiac: RRR, no m/r/g Respiratory: CTAB, normal WOB, no w/c/r GI: Soft, NTTP, non-distended  Extremities: NTTP, no peripheral edema. Dexcom noted on left arm. Neuro: Normal gait, moves all four extremities appropriately. Psych: Appropriate mood and affect   ASSESSMENT/PLAN:   Assessment & Plan Essential hypertension Not at goal today, increased amlodipine  to 10mg  daily, 1 month follow up  Type 2 diabetes mellitus with hyperglycemia, with long-term current use of insulin  (HCC) Recent A1c at goal, will work with Engineer, technical sales about scheduling eye exam, declined pneumonia vaccine today     Charmel Cooter, MD [facility]

## 2023-12-14 NOTE — Assessment & Plan Note (Signed)
 Not at goal today, increased amlodipine  to 10mg  daily, 1 month follow up

## 2023-12-27 ENCOUNTER — Other Ambulatory Visit: Payer: Self-pay

## 2023-12-27 ENCOUNTER — Other Ambulatory Visit (HOSPITAL_COMMUNITY): Payer: Self-pay

## 2023-12-27 ENCOUNTER — Ambulatory Visit: Admitting: Family Medicine

## 2023-12-27 VITALS — BP 136/60 | HR 81 | Wt 186.2 lb

## 2023-12-27 DIAGNOSIS — K219 Gastro-esophageal reflux disease without esophagitis: Secondary | ICD-10-CM | POA: Diagnosis not present

## 2023-12-27 DIAGNOSIS — I10 Essential (primary) hypertension: Secondary | ICD-10-CM | POA: Diagnosis present

## 2023-12-27 DIAGNOSIS — Z1231 Encounter for screening mammogram for malignant neoplasm of breast: Secondary | ICD-10-CM

## 2023-12-27 MED ORDER — AMLODIPINE-OLMESARTAN 5-20 MG PO TABS
1.0000 | ORAL_TABLET | Freq: Every day | ORAL | 1 refills | Status: DC
  Filled 2023-12-27: qty 30, 30d supply, fill #0

## 2023-12-27 NOTE — Progress Notes (Signed)
    SUBJECTIVE:   CHIEF COMPLAINT / HPI: Kinyarwanda interpretor Jocelyn used for entirety of visit.   HTN- increased amlodipine  to 10mg  at last visit. She has noticed bilateral ankle swelling. Denies chest pain or shortness of breath. Did not have ankle swelling on the 5 mg amlodipine .   GERD- taking protonix  40mg  daily and tolerating well.   Pneumonia vaccine- declines!  Needs mammogram scheduled- scheduled July 8th at 10:20 AM.  PERTINENT  PMH / PSH: T2DM, HTN  OBJECTIVE:   BP 136/60   Pulse 81   Wt 186 lb 3.2 oz (84.5 kg)   LMP 09/07/2018   SpO2 98%   BMI 30.05 kg/m   General: A&O, NAD HEENT: No sign of trauma, EOM grossly intact Cardiac: RRR, no m/r/g Respiratory: CTAB, normal WOB, no w/c/r GI: Soft, NTTP, non-distended  Extremities: NTTP, 1+ pitting edema of bilateral ankles, without discoloration Neuro: Normal gait, moves all four extremities appropriately. Psych: Appropriate mood and affect   ASSESSMENT/PLAN:   Assessment & Plan Essential hypertension BP at goal today, however bilateral leg swelling is bothersome to her Will change to combo pill amlo5/olmesartan 20mg  and f/u in 2 weeks for BP check Encounter for screening mammogram for breast cancer Scheduled for July 8th at 10:20 AM Gastroesophageal reflux disease, unspecified whether esophagitis present Well controlled continue Protonix    Declined pneumonia vaccine.    Charmel Cooter, MD [facility]

## 2023-12-27 NOTE — Assessment & Plan Note (Signed)
 Well controlled continue Protonix 

## 2023-12-27 NOTE — Patient Instructions (Addendum)
 It was wonderful to see you today.  Please bring ALL of your medications with you to every visit.   Today we talked about:  We changed your BP medication to Azor (amlodipine  5mg - olmesartan 20mg ) that you take once daily starting tomorrow.   [facility] [employer] [facility] [address] [place], [place] [address] Phone: [phone]   Thank you for choosing [facility].   Please call [phone] with any questions about today's appointment.  Please arrive at least 15 minutes prior to your scheduled appointments.   If you had blood work today, I will send you a MyChart message or a letter if results are normal. Otherwise, I will give you a call.   If you had a referral placed, they will call you to set up an appointment. Please give us  a call if you don't hear back in the next 2 weeks.   If you need additional refills before your next appointment, please call your pharmacy first.   Avanell Bob, MD  Family Medicine

## 2023-12-27 NOTE — Assessment & Plan Note (Signed)
 BP at goal today, however bilateral leg swelling is bothersome to her Will change to combo pill amlo5/olmesartan 20mg  and f/u in 2 weeks for BP check

## 2024-01-03 ENCOUNTER — Other Ambulatory Visit (HOSPITAL_COMMUNITY): Payer: Self-pay

## 2024-01-03 ENCOUNTER — Ambulatory Visit: Admitting: Family Medicine

## 2024-01-03 ENCOUNTER — Encounter: Payer: Self-pay | Admitting: Family Medicine

## 2024-01-03 VITALS — BP 139/79 | HR 83 | Ht 66.0 in | Wt 185.0 lb

## 2024-01-03 DIAGNOSIS — H43391 Other vitreous opacities, right eye: Secondary | ICD-10-CM

## 2024-01-03 DIAGNOSIS — I10 Essential (primary) hypertension: Secondary | ICD-10-CM

## 2024-01-03 NOTE — Assessment & Plan Note (Signed)
 BP at goal on new medication, repeat BMP today, follow up at end of June (switching from amlodipine  10mg  due to increased leg swelling)

## 2024-01-03 NOTE — Patient Instructions (Addendum)
 It was wonderful to see you today.  Please bring ALL of your medications with you to every visit.   Today we talked about:  For your vision- tomorrow I want you to go to [facility] at 8am - 12 pm [address] [employer] card and this paperwork to show that Dr Drue Gerald your PCP talked with Dr Pecen on 01/03/24 who recommended you walk in during morning office hours to see for 2 weeks of floaters in your right eye.   For your blood pressure- continue the Azor , we will recheck your kidney function today.  Thank you for choosing [facility].   Please call [phone] with any questions about today's appointment.  Please arrive at least 15 minutes prior to your scheduled appointments.   If you had blood work today, I will send you a MyChart message or a letter if results are normal. Otherwise, I will give you a call.   If you had a referral placed, they will call you to set up an appointment. Please give us  a call if you don't hear back in the next 2 weeks.   If you need additional refills before your next appointment, please call your pharmacy first.   Avanell Bob, MD  Family Medicine

## 2024-01-03 NOTE — Progress Notes (Signed)
    SUBJECTIVE:   CHIEF COMPLAINT / HPI:   Spot in vision- about two weeks ago, started in her R eye, a persistant spot that moves where her eye moves in her Right eye. No blurry vision, curtain dropping, or pain in her R eye. NO trauma, just woke up like that. Looks like a spot and strings that keep moving. No eye pain.  HTN- been taking Azor  for a week, denies any side effects. Last took yesterday PM.  PERTINENT  PMH / PSH: T2DM, HTN  OBJECTIVE:   BP 139/79   Pulse 83   Ht 5' 6 (1.676 m)   Wt 185 lb (83.9 kg)   LMP 09/07/2018   SpO2 100%   BMI 29.86 kg/m   General: alert & oriented, no apparent distress, well groomed HEENT: normocephalic, atraumatic, PERRL, EOMI without pain, conjunctiva non erythematous, no hyphema or fluid collection, no swelling around the eye, oral mucosa moist, neck supple Respiratory: normal respiratory effort GI: non-distended Skin: no rashes, no jaundice Psych: appropriate mood and affect   ASSESSMENT/PLAN:   Assessment & Plan Essential hypertension BP at goal on new medication, repeat BMP today, follow up at end of June (switching from amlodipine  10mg  due to increased leg swelling) Floaters in visual field, right 1039: Called office and left message with triage RN for Dr Chancey Combe, on call [facility].  1110: talked with Dr Paula Pecen, discussed clinical presentation. She recommended follow up at [facility] with her this week, either Tuesday/Wednesday/Friday in Am from 8-12. Pt can walk in with insurance card. Does not need urgent follow up today. Discussed with patient, she believes she can get a ride. Given address and dates/times she can stop by, discussed importance of follow up this week.      Charmel Cooter, MD [facility]

## 2024-01-04 LAB — BASIC METABOLIC PANEL WITH GFR
BUN/Creatinine Ratio: 16 (ref 12–28)
BUN: 12 mg/dL (ref 8–27)
CO2: 22 mmol/L (ref 20–29)
Calcium: 9.3 mg/dL (ref 8.7–10.3)
Chloride: 100 mmol/L (ref 96–106)
Creatinine, Ser: 0.74 mg/dL (ref 0.57–1.00)
Glucose: 189 mg/dL — ABNORMAL HIGH (ref 70–99)
Potassium: 4.6 mmol/L (ref 3.5–5.2)
Sodium: 139 mmol/L (ref 134–144)
eGFR: 92 mL/min/{1.73_m2} (ref >59)

## 2024-01-14 ENCOUNTER — Other Ambulatory Visit (HOSPITAL_COMMUNITY): Payer: Self-pay

## 2024-01-14 ENCOUNTER — Encounter: Payer: Self-pay | Admitting: Family Medicine

## 2024-01-14 ENCOUNTER — Ambulatory Visit: Admitting: Family Medicine

## 2024-01-14 VITALS — BP 139/73 | HR 84 | Resp 16 | Ht 65.0 in | Wt 188.4 lb

## 2024-01-14 DIAGNOSIS — M7989 Other specified soft tissue disorders: Secondary | ICD-10-CM

## 2024-01-14 DIAGNOSIS — I10 Essential (primary) hypertension: Secondary | ICD-10-CM | POA: Diagnosis not present

## 2024-01-14 MED ORDER — LOSARTAN POTASSIUM-HCTZ 50-12.5 MG PO TABS
1.0000 | ORAL_TABLET | Freq: Every day | ORAL | 2 refills | Status: DC
  Filled 2024-01-14: qty 30, 30d supply, fill #0

## 2024-01-14 NOTE — Patient Instructions (Addendum)
 It was wonderful to see you today.  Please bring ALL of your medications with you to every visit.   Today we talked about:   We will check lab work today.  For your eye: I want you to go to [facility] at 8am - 12 pm on Tuesdays, Wednesdays, or Thursdays at  [employer] card and this paperwork to show that Dr Donzetta your PCP talked with Dr Pecen on 01/03/24 who recommended you walk in during morning office hours to see for 2 weeks of floaters in your right eye.     I am changing your BP medicine as I suspect this is the cause of your leg swelling.  I want you to STOP the Azor . DO NOT TAKE THIS.  You are going to start a medication called Hyzaar losartan-hydrochlorothiazide  and take one tab once daily.   We have scheduled you a follow up to see me to recheck your blood pressure.   Thank you for choosing [facility].   Please call [phone] with any questions about today's appointment.  Please arrive at least 15 minutes prior to your scheduled appointments.   If you had blood work today, I will send you a MyChart message or a letter if results are normal. Otherwise, I will give you a call.   If you had a referral placed, they will call you to set up an appointment. Please give us  a call if you don't hear back in the next 2 weeks.   If you need additional refills before your next appointment, please call your pharmacy first.   Rollene Donzetta, MD  Family Medicine

## 2024-01-14 NOTE — Assessment & Plan Note (Signed)
 Well controlled today, switching from Azor  to Hyzaar due to leg swelling suspected from amlodipine  component F/u 2 weeks for BMP

## 2024-01-14 NOTE — Progress Notes (Signed)
    SUBJECTIVE:   CHIEF COMPLAINT / HPI: Kinyarwanda interpretor Jocelyn Uwase present for entire visit.   HTN- well controlled today. On combo pill and tolerating well, recent BMP wnl. However leg swelling has not improved. Started at beginning of June when increased to amlodipine  10mg  daily. Denies chest pain or shortness of breath. Not painful or red or discoloration of toes. Does hurt when she has to squish her feet into her shoes.   Feet swelling\pain- when she has to fit into shoes. Started beginning of June. Has not improved with decreasing the amlodipine  to 5 mg. No redness. Symmetric. No chest pain or shortness of breath.  PERTINENT  PMH / PSH: T2DM, HTN  OBJECTIVE:   BP 139/73   Pulse 84   Resp 16   Ht 5' 5 (1.651 m)   Wt 188 lb 6.4 oz (85.5 kg)   LMP 09/07/2018   SpO2 100%   BMI 31.35 kg/m   General: A&O, NAD HEENT: No sign of trauma, EOM grossly intact Cardiac: RRR, no m/r/g Respiratory: CTAB, normal WOB, no w/c/r GI: Soft, NTTP, non-distended  Extremities: NTTP, 3 + edema bilaterally. No erythema.  Neuro: Normal gait, moves all four extremities appropriately. Psych: Appropriate mood and affect   ASSESSMENT/PLAN:   Assessment & Plan Leg swelling Bilaterally symmetric, no signs of infection and unlikely DVT Coincided with increase to amlodipine  10mg  at beginning of June, no associated symptoms Will stop Azor  and transition to Hyzaar with follow up in 2 weeks for a BMP Will check BNP and UPC ratio to ensure other causes of bilateral leg swelling ruled out Essential hypertension Well controlled today, switching from Azor  to Hyzaar due to leg swelling suspected from amlodipine  component F/u 2 weeks for BMP   Reminded patient of date and time of screening mammogram.   Rollene FORBES Keeling, MD [facility]

## 2024-01-15 LAB — PROTEIN / CREATININE RATIO, URINE
Creatinine, Urine: 28 mg/dL
Protein, Ur: 5.4 mg/dL
Protein/Creat Ratio: 193 mg/g{creat} (ref 0–200)

## 2024-01-16 LAB — BRAIN NATRIURETIC PEPTIDE: BNP: 37.7 pg/mL (ref 0.0–100.0)

## 2024-01-25 ENCOUNTER — Ambulatory Visit
Admission: RE | Admit: 2024-01-25 | Discharge: 2024-01-25 | Disposition: A | Discharge status: Home / Self Care | Source: Ambulatory Visit | Attending: Family Medicine | Admitting: Family Medicine

## 2024-01-25 DIAGNOSIS — Z1231 Encounter for screening mammogram for malignant neoplasm of breast: Secondary | ICD-10-CM

## 2024-01-28 ENCOUNTER — Ambulatory Visit: Admitting: Family Medicine

## 2024-01-28 ENCOUNTER — Other Ambulatory Visit (HOSPITAL_COMMUNITY): Payer: Self-pay

## 2024-01-28 ENCOUNTER — Encounter: Payer: Self-pay | Admitting: Family Medicine

## 2024-01-28 VITALS — BP 148/88 | HR 71 | Wt 186.0 lb

## 2024-01-28 DIAGNOSIS — E1165 Type 2 diabetes mellitus with hyperglycemia: Secondary | ICD-10-CM

## 2024-01-28 DIAGNOSIS — Z794 Long term (current) use of insulin: Secondary | ICD-10-CM | POA: Diagnosis not present

## 2024-01-28 DIAGNOSIS — I10 Essential (primary) hypertension: Secondary | ICD-10-CM

## 2024-01-28 MED ORDER — LOSARTAN POTASSIUM-HCTZ 100-12.5 MG PO TABS
1.0000 | ORAL_TABLET | Freq: Every day | ORAL | 1 refills | Status: DC
  Filled 2024-01-28: qty 30, 30d supply, fill #0

## 2024-01-28 MED ORDER — LANTUS SOLOSTAR 100 UNIT/ML ~~LOC~~ SOPN
20.0000 [IU] | PEN_INJECTOR | Freq: Every day | SUBCUTANEOUS | 3 refills | Status: DC
  Filled 2024-01-28: qty 15, 75d supply, fill #0

## 2024-01-28 MED ORDER — FREESTYLE LIBRE 3 SENSOR MISC
5 refills | Status: DC
  Filled 2024-01-28: qty 2, 28d supply, fill #0

## 2024-01-28 NOTE — Assessment & Plan Note (Signed)
 Refilled insulin  and CGM monitor, will need A1c at next visit Needs eye exam, will reach out to congregational RN team to help schedule

## 2024-01-28 NOTE — Assessment & Plan Note (Signed)
 Not at goal, increase Hyzaar  100mg -12.5 mg, BMP today, f/u in 2 weeks to reassess BP Leg swelling improved off of amlodipine 

## 2024-01-28 NOTE — Patient Instructions (Addendum)
 It was wonderful to see you today.  Please bring ALL of your medications with you to every visit.   Today we talked about:  Twatangiye imiti mishya Hyzaar  (losartan  100mg  - hydrochlorothiazide  12.5 mg). [place] indi miti ya BP hanyuma ufate iyi nshya hanyuma utangire kuyifata. Nyamuneka uzane imiti yose kugirango ukurikirane gahunda.  Tuzakubona mubyumweru 2 kugirango usuzume BP. Twakoze laboratoire.  We started a new medication Hyzaar  (losartan  100mg  - hydrochlorothiazide  12.5 mg). Please STOP your other BP medications and pick up this new one and start taking it. Please bring all medications to your follow up appointment.  We will see you back in 2 weeks to recheck BP. We did lab work.   Thank you for choosing [facility].   Please call [phone] with any questions about today's appointment.  Please arrive at least 15 minutes prior to your scheduled appointments.   If you had blood work today, I will send you a MyChart message or a letter if results are normal. Otherwise, I will give you a call.    If you need additional refills before your next appointment, please call your pharmacy first.   Rollene Keeling, MD  Family Medicine

## 2024-01-28 NOTE — Progress Notes (Signed)
    SUBJECTIVE:   CHIEF COMPLAINT / HPI:   HTN- at last visit switched to Hyzaar  50 mg -12.5 mg to avoid amlodipine  due to pts leg swelling. Work up for leg swelling including BNP and UPC ratio was normal. Leg swelling has improved off of the amlodipine , almost gone now. Last took her medication yesterday as she takes it at 1 pm daily. NO side effects.  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 Also gets cold of her fingers, they do not change color. 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  Went to mammogram on 01/25/24, awaiting results.   PERTINENT  PMH / PSH: HTN,   OBJECTIVE:   BP (!) 148/88   Pulse 71   Wt 186 lb (84.4 kg)   LMP 09/07/2018   SpO2 100%   BMI 30.95 kg/m   General: A&O, NAD HEENT: No sign of trauma, EOM grossly intact Cardiac: RRR, no m/r/g Respiratory: CTAB, normal WOB, no w/c/r GI: Soft, NTTP, non-distended  Extremities: NTTP, trace ankle edema bilaterally Neuro: Normal gait, moves all four extremities appropriately. Psych: Appropriate mood and affect   ASSESSMENT/PLAN:   Assessment & Plan Essential hypertension Not at goal, increase Hyzaar  100mg -12.5 mg, BMP today, f/u in 2 weeks to reassess BP Leg swelling improved off of amlodipine  Type 2 diabetes mellitus with hyperglycemia, with long-term current use of insulin  (HCC) Refilled insulin  and CGM monitor, will need A1c at next visit Needs eye exam, will reach out to congregational RN team to help schedule   Recommended Hep B and pneumonia vaccines, pt declines at this time. Await mammogram results.  Rollene FORBES Keeling, MD [facility]

## 2024-01-29 LAB — BASIC METABOLIC PANEL WITH GFR
BUN/Creatinine Ratio: 13 (ref 12–28)
BUN: 9 mg/dL (ref 8–27)
CO2: 21 mmol/L (ref 20–29)
Calcium: 9.4 mg/dL (ref 8.7–10.3)
Chloride: 103 mmol/L (ref 96–106)
Creatinine, Ser: 0.67 mg/dL (ref 0.57–1.00)
Glucose: 139 mg/dL — ABNORMAL HIGH (ref 70–99)
Potassium: 5.2 mmol/L (ref 3.5–5.2)
Sodium: 143 mmol/L (ref 134–144)
eGFR: 99 mL/min/1.73 (ref >59)

## 2024-01-31 ENCOUNTER — Ambulatory Visit: Payer: Self-pay | Admitting: Family Medicine

## 2024-01-31 ENCOUNTER — Other Ambulatory Visit (HOSPITAL_COMMUNITY): Payer: Self-pay

## 2024-01-31 ENCOUNTER — Telehealth: Payer: Self-pay | Admitting: Family Medicine

## 2024-01-31 NOTE — Telephone Encounter (Signed)
 Attempted to call patient x2, lab results showing K 5.2 and would like to change her BP medication and not increase Hyzaar . VM not set up, unable to reach pt.  Called pharmacy, confirmed patient did pick it up three days ago.   Will continue to try to reach patient.  Rollene Keeling MD

## 2024-01-31 NOTE — Congregational Nurse Program (Signed)
  Dept: [phone]   Congregational Nurse Program Note  Date of Encounter: 01/31/2024  Past Medical History: Past Medical History:  Diagnosis Date   Acute appendicitis 11/21/2018   Back pain 11/27/2015   Diabetes mellitus without complication (HCC)    GERD (gastroesophageal reflux disease)    H. pylori infection 07/25/2019   Headache 12/15/2016   Hypertension    Palpitations 11/27/2015   Patient speaks only [place] 07/25/2019   Refugee health examination 11/27/2015   S/P laparoscopic appendectomy 11/21/2018   Scars, cultural scarification 11/27/2015    Encounter Details:  Community Questionnaire - 01/31/24 1408       Questionnaire   Ask client: Do you give verbal consent for me to treat you today? Yes    Student Assistance N/A    Location Patient Served  [facility]    Encounter Setting Phone/Text/Email    Population Status Migrant/Refugee    Insurance Medicaid    Insurance/Financial Assistance Referral N/A    Medication Have Medication Insecurities    Medical Provider Yes    Screening Referrals Made N/A    Medical Referrals Made N/A    Medical Appointment Completed [facility]    CNP Interventions Educate;Case Management;Counsel;Advocate/Support    Screenings CN Performed N/A    ED Visit Averted N/A    Life-Saving Intervention Made N/A         I have reached out to patient as  instructed by Dr Donzetta. She did not pick up on regular cellular calling  but picked up [employer].  I was able to communicate with patient in Swahili without the use of interpreter.I informed her that Dr Donzetta has ordered to stop  Hyzaar  100-12.5 mg  and go back to the previous lower dose of Hyzaar  50-12.5 mg. Patient reported that she had already taken today's dose. Reassured that all is well but to resume the old pill bottle of lower dose from tomorrow. Patient verbalized understanding.  Upcoming appointment reviewed with patient.All questions answered.  Deborah Lamichael Youkhana  RN BSN PCCN  [facility] [phone]-cell [phone]-office

## 2024-01-31 NOTE — Telephone Encounter (Signed)
 Congregational RN able to reach out to patient about stopping Hyzaar  100-12.5 and go back to 50-12.5 mg. Will repeat BMP at follow up.  Rollene Keeling MD

## 2024-02-15 ENCOUNTER — Encounter: Payer: Self-pay | Admitting: Pharmacist

## 2024-02-15 ENCOUNTER — Ambulatory Visit: Admitting: Pharmacist

## 2024-02-15 ENCOUNTER — Other Ambulatory Visit (HOSPITAL_COMMUNITY): Payer: Self-pay

## 2024-02-15 VITALS — BP 145/77 | HR 68 | Wt 186.0 lb

## 2024-02-15 DIAGNOSIS — I10 Essential (primary) hypertension: Secondary | ICD-10-CM | POA: Diagnosis not present

## 2024-02-15 DIAGNOSIS — Z794 Long term (current) use of insulin: Secondary | ICD-10-CM | POA: Diagnosis not present

## 2024-02-15 DIAGNOSIS — E1165 Type 2 diabetes mellitus with hyperglycemia: Secondary | ICD-10-CM

## 2024-02-15 MED ORDER — VALSARTAN-HYDROCHLOROTHIAZIDE 160-25 MG PO TABS
1.0000 | ORAL_TABLET | Freq: Every day | ORAL | 6 refills | Status: DC
  Filled 2024-02-15: qty 30, 30d supply, fill #0
  Filled 2024-03-16: qty 30, 30d supply, fill #1

## 2024-02-15 MED ORDER — FREESTYLE LIBRE 3 PLUS SENSOR MISC
11 refills | Status: DC
  Filled 2024-02-15 – 2024-02-29 (×2): qty 2, 30d supply, fill #0

## 2024-02-15 MED ORDER — INSULIN PEN NEEDLE 31G X 5 MM MISC
1.0000 | 99 refills | Status: DC | PRN
  Filled 2024-02-15: qty 100, 34d supply, fill #0

## 2024-02-15 MED ORDER — LANTUS SOLOSTAR 100 UNIT/ML ~~LOC~~ SOPN
18.0000 [IU] | PEN_INJECTOR | Freq: Every day | SUBCUTANEOUS | 3 refills | Status: DC
  Filled 2024-02-15: qty 15, 83d supply, fill #0

## 2024-02-15 NOTE — Assessment & Plan Note (Signed)
 Hypertension longstanding 1 year (since 2024) currently taking ARB/thiazide combination - no longer taking amlodipine . Blood pressure goal of <130/80 mmHg. Medication adherence is good. Bilateral lower extremity swelling remains but patient reports it is less despite discontinuation of amlodipine .  Higher dose thiazide attempted to assist with lower extremity swelling.  -Change from losartan  50mg /hydrochlorothiazide  12.5mg  to valsartan  160mg  /hydrochlorothiazide  25mg  - once daily - BMET today

## 2024-02-15 NOTE — Patient Instructions (Addendum)
 Byari byiza bing uyu munsi!  Igenzura ryawe ririmo gukora neza monta garnet beckers.    Intego yawe isukari yamaraso ni 80-130 mbere yo [place] na munsi ya 180 nyuma yo [place].  Elicia imiti: [place] imiti mishya yumuvuduko wamaraso - ibinini 1 kumunsi [place]agar[place] imiti yumuvuduko [employer].   Kugabanya urugero rwa insuline kuva mubice 20 buri munsi [place] kuri 18 buri munsi  Komeza indi miti yose.   Komeza akazi keza hamwe nimirire no gukora siporo. Intego y'ibiryo [employer], imbuto n'inyama zinanutse ([place], [place], [place]). Gerageza kugabanya gufata umunyu urya imboga nshyashya cyangwa zikonje ([place]), kwoza imboga zafunzwe [place] yo guteka kandi ntukongere umunyu wongeyeho kumafunguro.   It was nice to see you today!  Your control is doing much better than 1 year ago.    Your goal blood sugar is 80-130 before eating and less than 180 after eating.  Medication Changes: START New Blood pressure medication - 1 tablet daily Stop previous blood pressure medication.   Decrease Insulin  dose from 20 units daily to 18 units daily  Continue all other medication the same.   Keep up the good work with diet and exercise. Aim for a diet full of vegetables, fruit and lean meats (chicken, [place], fish). Try to limit salt intake by eating fresh or frozen vegetables (instead of canned), rinse canned vegetables prior to cooking and do not add any additional salt to meals.

## 2024-02-15 NOTE — Assessment & Plan Note (Signed)
 Diabetes longstanding for 1 year (since 2024) currently well controlled with intermittent and rare episodes of low glucose. Patient is able to verbalize appropriate hypoglycemia management plan. Medication adherence appears to be excellent.  -Decreased dose of basal insulin  Lantus  (insulin  glargine) from 20 to 18 units daily - taking at 1:00 PM  -Extensively discussed pathophysiology of diabetes, recommended lifestyle interventions, dietary effects on blood sugar control.  Goal is to avoid eating to maintain glucose from low readings.  -Counseled on s/sx of and management of hypoglycemia.

## 2024-02-15 NOTE — Progress Notes (Signed)
 S:     Chief Complaint  Patient presents with   Medication Management    Diabetes follow-up   63 y.o. female who presents for diabetes evaluation, education, and management. Patient arrives in good spirits and presents without any assistance. Patient is accompanied by [employer] interpreter Kallie Calk.    Patient was referred and last seen by Primary Care Provider, Dr. Donzetta, on 01/28/2024.    PMH is significant for T2DM, HTN, GERD.    Patient reports Diabetes was diagnosed in 2024.    Current diabetes medications include: insulin  glargine 20 units daily Current hypertension medications include: losartan  50/hydrochlorothiazide  12.5mg  daily   Patient reports adherence to taking all medications as prescribed.    Do you feel that your medications are working for you? yes Have you been experiencing any side effects to the medications prescribed? no Do you have any problems obtaining medications due to transportation or finances? no Insurance coverage: [place] Medicaid   Patient reports hypoglycemic events more than two weeks ago and describes appropriate strategies to manage and prevent low glucose readings. She believes the low may be due to insulin  dose.    Patient-reported exercise habits: Working again at [employer] - States she enjoys working.      O:   Review of Systems  All other systems reviewed and are negative.   Physical Exam Vitals reviewed.  Constitutional:      Appearance: Normal appearance.  Pulmonary:     Effort: Pulmonary effort is normal.  Musculoskeletal:     Right lower leg: Edema (1+) present.     Left lower leg: Edema (1+) present.  Neurological:     Mental Status: She is alert.  Psychiatric:        Mood and Affect: Mood normal.        Thought Content: Thought content normal.    Libre3 CGM Download today 02/15/2024 % Time CGM is active: 93% Average Glucose: 141 mg/dL Glucose Management Indicator: 6.7   Glucose Variability: 21.1% (goal <36%) Time in Goal:  - Time in range 70-180: 89% - Time above range: 11% - Time below range: 0% Observed patterns: Patient notices lows at 5:00-5:30 prior to evening meal ~~ at that time.   Lab Results  Component Value Date   HGBA1C 6.3 11/17/2023   Vitals:   02/15/24 0847 02/15/24 0913  BP: (!) 145/77 (!) 145/77  Pulse: 68   SpO2: 100%     Lipid Panel     Component Value Date/Time   LDLDIRECT 125 (H) 03/09/2023 0226    Patient is participating in a Managed Medicaid Plan:  Yes  - [employer]  A/P: Diabetes longstanding for 1 year (since 2024) currently well controlled with intermittent and rare episodes of low glucose. Patient is able to verbalize appropriate hypoglycemia management plan. Medication adherence appears to be excellent.  -Decreased dose of basal insulin  Lantus  (insulin  glargine) from 20 to 18 units daily - taking at 1:00 PM  -Extensively discussed pathophysiology of diabetes, recommended lifestyle interventions, dietary effects on blood sugar control.  Goal is to avoid eating to maintain glucose from low readings.  -Counseled on s/sx of and management of hypoglycemia.   Hypertension longstanding 1 year (since 2024) currently taking ARB/thiazide combination - no longer taking amlodipine . Blood pressure goal of <130/80 mmHg. Medication adherence is good. Bilateral lower extremity swelling remains but patient reports it is less despite discontinuation of amlodipine .  Higher dose thiazide attempted to assist with lower extremity swelling.  -Change  from losartan  50mg /hydrochlorothiazide  12.5mg  to valsartan  160mg  /hydrochlorothiazide  25mg  - once daily - BMET today   Written patient instructions provided. Patient verbalized understanding of treatment plan.  Total time in face to face counseling 32 minutes.    Follow-up:  Pharmacist PRN PCP clinic visit in [place] in ~ 4 weeks  03/27/2024 Patient seen with Dr. Camie Dixons, DO  BMET all  values normal range and stable with exception glucose which was ~ 150

## 2024-02-16 LAB — BASIC METABOLIC PANEL WITH GFR
BUN/Creatinine Ratio: 23 (ref 12–28)
BUN: 15 mg/dL (ref 8–27)
CO2: 25 mmol/L (ref 20–29)
Calcium: 9.7 mg/dL (ref 8.7–10.3)
Chloride: 98 mmol/L (ref 96–106)
Creatinine, Ser: 0.64 mg/dL (ref 0.57–1.00)
Glucose: 152 mg/dL — ABNORMAL HIGH (ref 70–99)
Potassium: 4.7 mmol/L (ref 3.5–5.2)
Sodium: 134 mmol/L (ref 134–144)
eGFR: 100 mL/min/1.73 (ref >59)

## 2024-02-16 NOTE — Progress Notes (Signed)
 Reviewed and agree with Dr Macky Lower plan.

## 2024-02-29 ENCOUNTER — Other Ambulatory Visit (HOSPITAL_COMMUNITY): Payer: Self-pay

## 2024-02-29 ENCOUNTER — Ambulatory Visit: Admitting: Pharmacist

## 2024-03-16 ENCOUNTER — Other Ambulatory Visit (HOSPITAL_COMMUNITY): Payer: Self-pay

## 2024-03-17 ENCOUNTER — Ambulatory Visit (HOSPITAL_COMMUNITY)
Admission: EM | Admit: 2024-03-17 | Discharge: 2024-03-17 | Disposition: A | Discharge status: Home / Self Care | Attending: Family Medicine | Admitting: Family Medicine

## 2024-03-17 ENCOUNTER — Encounter (HOSPITAL_COMMUNITY): Payer: Self-pay | Admitting: *Deleted

## 2024-03-17 DIAGNOSIS — R002 Palpitations: Secondary | ICD-10-CM | POA: Diagnosis not present

## 2024-03-17 NOTE — ED Provider Notes (Signed)
 [facility]  Note:  This document was prepared using Dragon voice recognition software and may include unintentional dictation errors.  MRN: [id number] DOB: 10-17-1960  Subjective:   Deborah Sampson is a 63 y.o. female presenting for 1 episode of heart palpitations that occurred this morning around 4 AM.  Patient reports that she woke from her sleep with severely increased heart rate.  Patient became anxious and was concerned that her blood pressure may be causing the symptoms to occur.  Patient reports that symptoms have subsided and she is not experiencing any chest pain, shortness of breath, weakness, dizziness.  Patient states she does take medication for blood pressure but does not have any way to check her pressure at home.  Patient has follow-up appointment with her primary care provider on March 27, 2024.  Patient was unable to go back to sleep after her symptoms occurred due to anxiety which prompted her to come to urgent care for evaluation.  No current facility-administered medications for this encounter.  Current Outpatient Medications:    insulin  glargine (LANTUS  SOLOSTAR) 100 UNIT/ML Solostar Pen, Inject 18 Units into the skin daily. Note dose adjustment, Disp: 15 mL, Rfl: 3   pantoprazole  (PROTONIX ) 40 MG tablet, Take 1 tablet (40 mg total) by mouth daily., Disp: 90 tablet, Rfl: 1   valsartan -hydrochlorothiazide  (DIOVAN -HCT) 160-25 MG tablet, Take 1 tablet by mouth daily., Disp: 30 tablet, Rfl: 6   Accu-Chek Softclix Lancets lancets, Use as instructed (Patient not taking: Reported on 02/15/2024), Disp: 100 each, Rfl: 12   Blood Glucose Monitoring Suppl (ACCU-CHEK AVIVA PLUS) w/Device KIT, Use to check glucose (Patient not taking: Reported on 02/15/2024), Disp: 1 kit, Rfl: 0   Blood Glucose Monitoring Suppl (ACCU-CHEK GUIDE) w/Device KIT, Use to check blood sugar 3 times per day (Patient not taking: Reported on 02/15/2024), Disp: 1 kit, Rfl: 0   Continuous Glucose  Sensor (FREESTYLE LIBRE 3 PLUS SENSOR) MISC, Change sensor every 15 days., Disp: 2 each, Rfl: 11   glucose blood (ACCU-CHEK AVIVA PLUS) test strip, Use as instructed (Patient not taking: Reported on 02/15/2024), Disp: 100 each, Rfl: 12   glucose blood (ACCU-CHEK GUIDE TEST) test strip, Test 3 (three) times daily. (Patient not taking: Reported on 02/15/2024), Disp: 100 each, Rfl: 12   Insulin  Pen Needle 31G X 5 MM MISC, Use with insulin , Disp: 100 each, Rfl: PRN   polyethylene glycol powder (MIRALAX ) 17 GM/SCOOP powder, Take 17 g by mouth daily as needed for mild constipation or moderate constipation (constipation, may use twice a day as needed)., Disp: 255 g, Rfl: 5   Allergies  Allergen Reactions   Penicillins Rash    Past Medical History:  Diagnosis Date   Acute appendicitis 11/21/2018   Back pain 11/27/2015   Diabetes mellitus without complication (HCC)    GERD (gastroesophageal reflux disease)    H. pylori infection 07/25/2019   Headache 12/15/2016   Hypertension    Palpitations 11/27/2015   Patient speaks only [place] 07/25/2019   Refugee health examination 11/27/2015   S/P laparoscopic appendectomy 11/21/2018   Scars, cultural scarification 11/27/2015     Past Surgical History:  Procedure Laterality Date   APPENDECTOMY     LAPAROSCOPIC APPENDECTOMY N/A 11/21/2018   Procedure: APPENDECTOMY LAPAROSCOPIC;  Surgeon: Ebbie Cough, MD;  Location: [facility] OR;  Service: General;  Laterality: N/A;    Family History  Family history unknown: Yes    Social History   Tobacco Use   Smoking status: Former    Current  packs/day: 0.00    Types: Cigarettes    Quit date: 04/05/2014    Years since quitting: 9.9   Smokeless tobacco: Never  Vaping Use   Vaping status: Never Used  Substance Use Topics   Alcohol use: Not Currently   Drug use: Never    ROS Refer to HPI for ROS details.  Objective:   Vitals: BP (!) 141/76   Pulse 70 Comment: regular  Temp 98.2 F (36.8  C) (Oral)   Resp 16   LMP 09/07/2018   SpO2 100%   Physical Exam Vitals and nursing note reviewed.  Constitutional:      General: She is not in acute distress.    Appearance: She is well-developed. She is not ill-appearing or toxic-appearing.  HENT:     Head: Normocephalic and atraumatic.     Mouth/Throat:     Mouth: Mucous membranes are moist.  Eyes:     Extraocular Movements: Extraocular movements intact.     Conjunctiva/sclera: Conjunctivae normal.  Cardiovascular:     Rate and Rhythm: Normal rate and regular rhythm.     Heart sounds: No murmur heard.    No friction rub. No gallop.  Pulmonary:     Effort: Pulmonary effort is normal. No respiratory distress.     Breath sounds: No stridor. No wheezing, rhonchi or rales.  Chest:     Chest wall: No tenderness.  Skin:    General: Skin is warm and dry.  Neurological:     General: No focal deficit present.     Mental Status: She is alert and oriented to person, place, and time.  Psychiatric:        Mood and Affect: Mood normal.        Behavior: Behavior normal.        Thought Content: Thought content normal.        Judgment: Judgment normal.     Procedures  No results found for this or any previous visit (from the past 24 hours).  No results found.   Assessment and Plan :     Discharge Instructions       1. Palpitations (Primary) - ED EKG completed in UC shows normal sinus rhythm with a ventricular rate of 68 bpm, normal EKG, no STEMI. - Continue to monitor for any repeat of previous symptoms.  If you experience any palpitations, chest pain, severe tachycardia, shortness of breath, weakness, dizziness, blurred vision call 911 or follow-up in the emergency department immediately for further evaluation. - Take prescribed medication as prescribed for control of hypertension and diabetes. - Follow-up with primary care physician on September 8th as scheduled for further evaluation and management.      Dorinda Stehr B  Hobart Marte   Maalle Starrett, [place], [place] 03/17/24 1104

## 2024-03-17 NOTE — ED Triage Notes (Signed)
 Via AM[employer] video interpreter #[id number]: Pt had episode of feeling rapid HR this AM @ 0400 after leaving from work and lasting x approx 30 min. Denies any sensation of rapid HR at present. Denies any chest pain.

## 2024-03-17 NOTE — Discharge Instructions (Addendum)
  1. Palpitations (Primary) - ED EKG completed in UC shows normal sinus rhythm with a ventricular rate of 68 bpm, normal EKG, no STEMI. - Continue to monitor for any repeat of previous symptoms.  If you experience any palpitations, chest pain, severe tachycardia, shortness of breath, weakness, dizziness, blurred vision call 911 or follow-up in the emergency department immediately for further evaluation. - Take prescribed medication as prescribed for control of hypertension and diabetes. - Follow-up with primary care physician on September 8th as scheduled for further evaluation and management.

## 2024-03-27 ENCOUNTER — Other Ambulatory Visit (HOSPITAL_COMMUNITY): Payer: Self-pay

## 2024-03-27 ENCOUNTER — Ambulatory Visit (INDEPENDENT_AMBULATORY_CARE_PROVIDER_SITE_OTHER): Admitting: Family Medicine

## 2024-03-27 ENCOUNTER — Encounter: Payer: Self-pay | Admitting: Family Medicine

## 2024-03-27 VITALS — BP 136/82 | HR 75 | Wt 187.4 lb

## 2024-03-27 DIAGNOSIS — K5909 Other constipation: Secondary | ICD-10-CM

## 2024-03-27 DIAGNOSIS — K279 Peptic ulcer, site unspecified, unspecified as acute or chronic, without hemorrhage or perforation: Secondary | ICD-10-CM

## 2024-03-27 DIAGNOSIS — Z794 Long term (current) use of insulin: Secondary | ICD-10-CM

## 2024-03-27 DIAGNOSIS — E1165 Type 2 diabetes mellitus with hyperglycemia: Secondary | ICD-10-CM

## 2024-03-27 DIAGNOSIS — K219 Gastro-esophageal reflux disease without esophagitis: Secondary | ICD-10-CM

## 2024-03-27 DIAGNOSIS — I10 Essential (primary) hypertension: Secondary | ICD-10-CM

## 2024-03-27 LAB — POCT GLYCOSYLATED HEMOGLOBIN (HGB A1C): HbA1c, POC (controlled diabetic range): 6.8 % (ref 0.0–7.0)

## 2024-03-27 MED ORDER — FREESTYLE LIBRE 3 PLUS SENSOR MISC
2 refills | Status: DC
  Filled 2024-03-27: qty 2, 30d supply, fill #0
  Filled 2024-04-28: qty 2, 30d supply, fill #1
  Filled 2024-05-29: qty 2, 30d supply, fill #2
  Filled 2024-07-12: qty 2, 30d supply, fill #3

## 2024-03-27 MED ORDER — PANTOPRAZOLE SODIUM 40 MG PO TBEC
40.0000 mg | DELAYED_RELEASE_TABLET | Freq: Every day | ORAL | 3 refills | Status: AC
  Filled 2024-03-27: qty 90, 90d supply, fill #0

## 2024-03-27 MED ORDER — FAMOTIDINE 20 MG PO TABS
20.0000 mg | ORAL_TABLET | Freq: Every day | ORAL | 3 refills | Status: AC
  Filled 2024-03-27: qty 90, 90d supply, fill #0

## 2024-03-27 MED ORDER — VALSARTAN-HYDROCHLOROTHIAZIDE 160-25 MG PO TABS
1.0000 | ORAL_TABLET | Freq: Every day | ORAL | 3 refills | Status: DC
  Filled 2024-03-27 – 2024-04-14 (×2): qty 90, 90d supply, fill #0

## 2024-03-27 MED ORDER — POLYETHYLENE GLYCOL 3350 17 GM/SCOOP PO POWD
17.0000 g | Freq: Every day | ORAL | 5 refills | Status: AC | PRN
  Filled 2024-03-27: qty 238, 7d supply, fill #0

## 2024-03-27 NOTE — Patient Instructions (Addendum)
 It was wonderful to see you today.  Please bring ALL of your medications with you to every visit.   Today we talked about:  - We checked your lab work today. Your A1c is 6.8 - this is great! Continue to avoid ibuprofen , aleve , or NSAIDs.  We started pepcid  or famotidine  20mg  at night for your GERD.   Thank you for choosing [facility].   Please call [phone] with any questions about today's appointment.  Please arrive at least 15 minutes prior to your scheduled appointments.   If you had blood work today, I will send you a MyChart message or a letter if results are normal. Otherwise, I will give you a call.   If you had a referral placed, they will call you to set up an appointment. Please give us  a call if you don't hear back in the next 2 weeks.   If you need additional refills before your next appointment, please call your pharmacy first.  Don't forget to check out the [facility] in the [facility] at [address] [phone] Affordable prices on prescriptions and over-the-counter items, as well as services like vaccinations and medication home delivery.   Rollene Keeling, MD  Family Medicine

## 2024-03-27 NOTE — Progress Notes (Addendum)
    SUBJECTIVE:   CHIEF COMPLAINT / HPI:   Discussed the use of AI scribe software for clinical note transcription with the patient, who gave verbal consent to proceed. Kinyarwanda interpreter used for entirety of visit.   History of Present Illness Deborah Sampson is a 63 year old female with diabetes and hypertension who presents with abdominal pain.  Epigastric pain and gastrointestinal symptoms - Heavy pressure in the epigastric region, sometimes with a burning sensation - Pain is not meal-related and can occur at any time, not eating chocolate or peppermint or caffeine  but does like tomatoes - Pain episodes are accompanied by sweating - No vomiting - No blood in stool - Occasional constipation, no diarrhea - Last bowel movement was the night before the visit and was not hard  T2DM on insulin  - Diabetes managed with 18 units of insulin  daily - Insulin  administered around 1 PM before work - Blood glucose levels occasionally drop to around 80 mg/dL but only once or twice in past month, can tell when monitor beeps and will eat something - check lipid panel, likely needs statin  Gastroesophageal reflux symptoms and management - Pantoprazole  taken daily for acid reflux - Continued burning stomach pain despite pantoprazole  therapy - No NSAID use  Hypertension management - Diovan  taken for blood pressure control - Blood pressure is effectively controlled with current regimen  Constipation management - Miralax , one scoop daily, used to manage constipation, is out, has constipation occassionally    PERTINENT  PMH / PSH: HTN, T2DM on insulin , GERD  OBJECTIVE:   BP 136/82   Pulse 75   Wt 187 lb 6.4 oz (85 kg)   LMP 09/07/2018   SpO2 100%   BMI 31.18 kg/m   Physical Exam General: A&O, NAD HEENT: No sign of trauma, EOM grossly intact Cardiac: RRR, no m/r/g Respiratory: CTAB, normal WOB, no w/c/r GI: Soft, NTTP, non-distended , no guarding or rebound, no CVA  tenderness Extremities: NTTP, no peripheral edema. Neuro: Normal gait, moves all four extremities appropriately. Psych: Appropriate mood and affect     ASSESSMENT/PLAN:   Assessment & Plan Epigastric pain and gastroesophageal reflux disease Current treatment with pantoprazole  provides partial relief. Benign abdominal exam.  - Prescribe Pepcid  for evening use to manage symptoms. - Continue pantoprazole  in the morning. - Advised dietary modifications to avoid caffeine , tomatoes, and acidic foods. - Recommend Tylenol  for pain management instead of NSAIDs.  Type 2 diabetes mellitus with hyperglycemia Most blood glucose logs are in the 120s to 250s, with a few readings in the 90s. No recent hypoglycemic episodes since insulin  dose adjustment to 18 units. She is aware of hypoglycemia symptoms and manages them appropriately. - Continue Lantus  18 units subcutaneously daily. - A1c 6.8 today, continue current regimen - Urine albumin/creatinine ratio collected today  Essential hypertension Blood pressure is well-controlled on current medication regimen. Recent change to Diovan  has been effective. - Continue Diovan  for blood pressure management. Checking BMP today.  Constipation Intermittent constipation with recent normal bowel movement. Current use of Miralax  as needed. UTD on colonoscopy in 2021. - Refill Miralax  for use as needed to manage constipation.  General Health Maintenance Declined pneumonia and flu vaccinations despite recommendation to prevent severe infections during winter.      Rollene FORBES Keeling, MD [facility]

## 2024-03-27 NOTE — Addendum Note (Signed)
 Addended by: DONZETTA QUANT E on: 03/27/2024 11:10 AM   Modules accepted: Orders

## 2024-03-27 NOTE — Addendum Note (Signed)
 Addended by: DONZETTA QUANT E on: 03/27/2024 11:11 AM   Modules accepted: Orders

## 2024-03-28 ENCOUNTER — Other Ambulatory Visit (HOSPITAL_COMMUNITY): Payer: Self-pay

## 2024-03-28 ENCOUNTER — Ambulatory Visit: Payer: Self-pay | Admitting: Family Medicine

## 2024-03-28 DIAGNOSIS — E1169 Type 2 diabetes mellitus with other specified complication: Secondary | ICD-10-CM

## 2024-03-28 LAB — LIPID PANEL
Chol/HDL Ratio: 9.4 ratio — ABNORMAL HIGH (ref 0.0–4.4)
Cholesterol, Total: 255 mg/dL — ABNORMAL HIGH (ref 100–199)
HDL: 27 mg/dL — ABNORMAL LOW (ref >39)
LDL Chol Calc (NIH): 95 mg/dL (ref 0–99)
Triglycerides: 786 mg/dL (ref 0–149)
VLDL Cholesterol Cal: 133 mg/dL — ABNORMAL HIGH (ref 5–40)

## 2024-03-28 LAB — BASIC METABOLIC PANEL WITH GFR
BUN/Creatinine Ratio: 19 (ref 12–28)
BUN: 13 mg/dL (ref 8–27)
CO2: 23 mmol/L (ref 20–29)
Calcium: 9.7 mg/dL (ref 8.7–10.3)
Chloride: 99 mmol/L (ref 96–106)
Creatinine, Ser: 0.69 mg/dL (ref 0.57–1.00)
Glucose: 191 mg/dL — ABNORMAL HIGH (ref 70–99)
Potassium: 4.5 mmol/L (ref 3.5–5.2)
Sodium: 138 mmol/L (ref 134–144)
eGFR: 99 mL/min/1.73 (ref >59)

## 2024-03-28 LAB — MICROALBUMIN / CREATININE URINE RATIO
Creatinine, Urine: 54 mg/dL
Microalb/Creat Ratio: 8 mg/g{creat} (ref 0–29)
Microalbumin, Urine: 4.1 ug/mL

## 2024-03-29 ENCOUNTER — Other Ambulatory Visit (HOSPITAL_COMMUNITY): Payer: Self-pay

## 2024-03-29 MED ORDER — ATORVASTATIN CALCIUM 80 MG PO TABS
80.0000 mg | ORAL_TABLET | Freq: Every day | ORAL | 3 refills | Status: DC
  Filled 2024-03-29: qty 90, 90d supply, fill #0

## 2024-03-29 NOTE — Telephone Encounter (Signed)
 Called patient with Kinyarwanda interpretor.  Confirmed I was speaking with patient. Discussed elevated cholesterol. Recommended starting atorvastatin  80 mg daily. She was in agreement and knows location of pharmacy.   Confirmed she has picked up the pepcid . Notes it is helping and her abdominal epigastric burning pain is improved. Discussed ED/return precautions. Confirmed she has clinic number.   Rollene Keeling MD

## 2024-04-14 ENCOUNTER — Other Ambulatory Visit (HOSPITAL_COMMUNITY): Payer: Self-pay

## 2024-04-28 ENCOUNTER — Other Ambulatory Visit (HOSPITAL_COMMUNITY): Payer: Self-pay

## 2024-05-12 ENCOUNTER — Ambulatory Visit: Payer: Self-pay | Admitting: Family Medicine

## 2024-05-29 ENCOUNTER — Other Ambulatory Visit (HOSPITAL_COMMUNITY): Payer: Self-pay

## 2024-06-09 ENCOUNTER — Encounter: Payer: Self-pay | Admitting: Family Medicine

## 2024-06-09 ENCOUNTER — Ambulatory Visit (INDEPENDENT_AMBULATORY_CARE_PROVIDER_SITE_OTHER): Payer: Self-pay | Admitting: Family Medicine

## 2024-06-09 ENCOUNTER — Other Ambulatory Visit (HOSPITAL_COMMUNITY): Payer: Self-pay

## 2024-06-09 VITALS — BP 138/74 | HR 73 | Ht 65.0 in | Wt 184.6 lb

## 2024-06-09 DIAGNOSIS — E1169 Type 2 diabetes mellitus with other specified complication: Secondary | ICD-10-CM

## 2024-06-09 DIAGNOSIS — E785 Hyperlipidemia, unspecified: Secondary | ICD-10-CM

## 2024-06-09 DIAGNOSIS — R200 Anesthesia of skin: Secondary | ICD-10-CM

## 2024-06-09 DIAGNOSIS — E1165 Type 2 diabetes mellitus with hyperglycemia: Secondary | ICD-10-CM

## 2024-06-09 DIAGNOSIS — G44209 Tension-type headache, unspecified, not intractable: Secondary | ICD-10-CM

## 2024-06-09 DIAGNOSIS — Z794 Long term (current) use of insulin: Secondary | ICD-10-CM

## 2024-06-09 MED ORDER — LANTUS SOLOSTAR 100 UNIT/ML ~~LOC~~ SOPN
18.0000 [IU] | PEN_INJECTOR | Freq: Every day | SUBCUTANEOUS | 3 refills | Status: AC
  Filled 2024-06-09: qty 15, 83d supply, fill #0

## 2024-06-09 MED ORDER — INSULIN PEN NEEDLE 31G X 5 MM MISC
1.0000 | 99 refills | Status: AC | PRN
  Filled 2024-06-09: qty 100, 34d supply, fill #0

## 2024-06-09 MED ORDER — ACETAMINOPHEN 500 MG PO TABS
1000.0000 mg | ORAL_TABLET | Freq: Three times a day (TID) | ORAL | 1 refills | Status: DC | PRN
  Filled 2024-06-09: qty 60, 10d supply, fill #0

## 2024-06-09 NOTE — Progress Notes (Signed)
    SUBJECTIVE:   CHIEF COMPLAINT / HPI:   Discussed the use of AI scribe software for clinical note transcription with the patient, who gave verbal consent to proceed.  History of Present Illness Deborah Sampson is a 63 year old female with diabetes who presents with arm numbness and headaches.  Right upper extremity paresthesia - Constant numbness in right pointer finger where she had a previous cut - Numbness sometimes occurs after laying on the arm of her whole arm but then wears off - No loss of arm function - No association with headaches, trauma, neck pain, or shoulder pain  Cephalgia - Increased frequency of headaches this month - Headaches are sharp and intermittent - Headaches resolve spontaneously - No associated nausea, vomiting, or vision changes, or weakness - Has not tried any medications for them  Diabetes mellitus management - Uses 18 units of insulin  for glycemic control - Occasional hypoglycemia with blood sugar levels as low as 51 mg/dL - Smaller, frequent meals help stabilize blood sugar - Maintains adequate nutrition and hydration  HLD- started atorvastatin  80 mg daily at last visit, has been taking. Can repeat lipids in 1 month.    OBJECTIVE:   LMP 09/07/2018   Physical Exam General: alert & oriented, no apparent distress, well groomed HEENT: normocephalic, atraumatic, EOM grossly intact, oral mucosa moist, neck supple Respiratory: normal respiratory effort GI: non-distended, soft, non-tender to palpation Skin: no rashes, no jaundice Psych: appropriate mood and affect  Neuro: Memory: Intact .  CN II: PERRL CN III, IV,VI: EOMI CV V: Normal sensation in V1, V2, V3 CVII: Symmetric smile and brow raise CN VIII: Normal hearing CN IX,X: Symmetric palate raise  CN XI: 5/5 shoulder shrug CN XII: Symmetric tongue protrusion  UE and LE strength 5/5  Normal visual fields bilaterally. Sensation normal upper and lower ext's bilaterally except numbness  of R pointer finger tip. Strength 5/5 in upper and lower ext's bilaterally.   ASSESSMENT/PLAN:   Assessment & Plan Type 2 diabetes mellitus with hyperglycemia, with long-term current use of insulin  (HCC) - Continue current insulin  dose of 18 units. - Advised eating multiple small meals or snacks, especially at work, to prevent high blood sugar spikes. Tension headache Prn apap prescribed, headache diary, no red flag symptoms Hyperlipidemia associated with type 2 diabetes mellitus (HCC) Repeat lipid panel in 1 mo, f/u scheduled with Dr Amalia Numbness of finger Peripheral finger numbness, status post healed cut Intermittent numbness in the tip of the pointer finger, likely benign and related to previous cut. No significant concern for nerve damage. - Reassured that numbness is likely benign and related to previous cut.  Rollene FORBES Keeling, MD [facility]

## 2024-06-09 NOTE — Assessment & Plan Note (Signed)
-   Continue current insulin  dose of 18 units. - Advised eating multiple small meals or snacks, especially at work, to prevent high blood sugar spikes.

## 2024-06-09 NOTE — Patient Instructions (Addendum)
 It was wonderful to see you today.  Please bring ALL of your medications with you to every visit.   Today we talked about:  - You can take tylenol  1000mg  every 8 hours as needed for headache.   Thank you for choosing [facility].   Please call [phone] with any questions about today's appointment.  Please arrive at least 15 minutes prior to your scheduled appointments.   If you had blood work today, I will send you a MyChart message or a letter if results are normal. Otherwise, I will give you a call.   If you had a referral placed, they will call you to set up an appointment. Please give us  a call if you don't hear back in the next 2 weeks.   If you need additional refills before your next appointment, please call your pharmacy first.  Don't forget to check out the [facility] in the [facility] at [address] [phone] Affordable prices on prescriptions and over-the-counter items, as well as services like vaccinations and medication home delivery.   Rollene Keeling, MD  Family Medicine

## 2024-06-12 ENCOUNTER — Ambulatory Visit: Payer: Self-pay | Admitting: Pharmacist

## 2024-06-12 ENCOUNTER — Other Ambulatory Visit (HOSPITAL_COMMUNITY): Payer: Self-pay

## 2024-06-12 ENCOUNTER — Encounter: Payer: Self-pay | Admitting: Pharmacist

## 2024-06-12 VITALS — BP 124/86 | HR 75 | Wt 182.8 lb

## 2024-06-12 DIAGNOSIS — E1169 Type 2 diabetes mellitus with other specified complication: Secondary | ICD-10-CM

## 2024-06-12 DIAGNOSIS — Z794 Long term (current) use of insulin: Secondary | ICD-10-CM

## 2024-06-12 DIAGNOSIS — E1165 Type 2 diabetes mellitus with hyperglycemia: Secondary | ICD-10-CM

## 2024-06-12 DIAGNOSIS — E785 Hyperlipidemia, unspecified: Secondary | ICD-10-CM

## 2024-06-12 DIAGNOSIS — I10 Essential (primary) hypertension: Secondary | ICD-10-CM

## 2024-06-12 MED ORDER — ATORVASTATIN CALCIUM 80 MG PO TABS
80.0000 mg | ORAL_TABLET | Freq: Every day | ORAL | 3 refills | Status: AC
  Filled 2024-06-12: qty 90, 90d supply, fill #0

## 2024-06-12 MED ORDER — VALSARTAN-HYDROCHLOROTHIAZIDE 160-25 MG PO TABS
1.0000 | ORAL_TABLET | Freq: Every day | ORAL | 3 refills | Status: AC
  Filled 2024-06-12 – 2024-07-12 (×2): qty 90, 90d supply, fill #0

## 2024-06-12 NOTE — Patient Instructions (Addendum)
 Byari byiza bing uyu munsi!  Intego yawe ni isukari iri hagati ya 80 na 130 mbere yo kurya no munsi ya 180 nyuma yo kurya.  Guhindura imiti: Komeza indi miti yose uko yakabaye.  Komeza gukora indyo nziza no gukora siporo. Igamize ku indyo honeywell, imbuto n'inyama zitarimo ibinure (inkoko, itunguru, Stuart). Gerageza kugabanya umunyu urya imboga mbisi cyangwa zikonje (aho kurya mu macupa), oza imboga zo mu macupa mbere yo guteka kandi ntushyiremo umunyu wiyongereye ku mafunguro.   It was nice to see you today!  Your goal blood sugar is 80-130 before eating and less than 180 after eating.  Medication Changes: Continue all other medication the same.   Keep up the good work with diet and exercise. Aim for a diet full of vegetables, fruit and lean meats (chicken, turkey, fish). Try to limit salt intake by eating fresh or frozen vegetables (instead of canned), rinse canned vegetables prior to cooking and do not add any additional salt to meals.

## 2024-06-12 NOTE — Assessment & Plan Note (Addendum)
 Diabetes longstanding currently controlled with last A1c 6.8 on Lantus  (insulin  glargine). Patient is  able to verbalize appropriate hypoglycemia management plan. Medication adherence appears good. Patient comes to office today for CGM assistance as her reader has not been working and she was in need of a new sensor.   - New sensor and CGM READER provided as sample.  -Continued Lantus  (insulin  glargine) at 18 units daily

## 2024-06-12 NOTE — Progress Notes (Signed)
 S:     Chief Complaint  Patient presents with   Medication Management    CGM assistance   63 y.o. female who presents for diabetes evaluation, education, and management. Patient arrives in good spirits and presents without any assistance. Interpreter present GLENWOOD Shires [id number] and Ronnald [phone] and Del [phone]  Patient was referred and last seen by Primary Care Provider, Dr. Donzetta, on 06/09/2024.   Patient reports Diabetes was diagnosed in 2024.   Current diabetes medications include: Lantus  (insulin  glargine) 18 units daily Current hypertension medications include: valsartan -hydrochlorothiazide  160-25mg  Current hyperlipidemia medications include: atorvastatin  80 mg daily  Patient reports adherence to taking all medications as prescribed.   Insurance coverage: none  Patient denies hypoglycemic events.  Patient reports nocturia (nighttime urination) 1-2 times a night. Patient denies neuropathy (nerve pain). Patient denies visual changes. Patient denies self foot exams.   O:  Review of Systems  All other systems reviewed and are negative.  Physical Exam Vitals reviewed.  Neurological:     Mental Status: She is alert.  Psychiatric:        Mood and Affect: Mood normal.        Behavior: Behavior normal.    Lab Results  Component Value Date   HGBA1C 6.8 03/27/2024   Vitals:   06/12/24 0933  BP: 124/86  Pulse: 75  SpO2: 100%    Lipid Panel     Component Value Date/Time   CHOL 255 (H) 03/27/2024 1149   TRIG 786 (HH) 03/27/2024 1149   HDL 27 (L) 03/27/2024 1149   CHOLHDL 9.4 (H) 03/27/2024 1149   LDLCALC 95 03/27/2024 1149   LDLDIRECT 125 (H) 03/09/2023 0226    Clinical Atherosclerotic Cardiovascular Disease (ASCVD): No  The 10-year ASCVD risk score (Arnett DK, et al., 2019) is: 25.9%   Values used to calculate the score:     Age: 37 years     Clincally relevant sex: Female     Is Non-Hispanic African American: Yes     Diabetic: Yes     Tobacco smoker:  No     Systolic Blood Pressure: 124 mmHg     Is BP treated: Yes     HDL Cholesterol: 27 mg/dL     Total Cholesterol: 255 mg/dL   A/P: Diabetes longstanding currently controlled with last A1c 6.8 on Lantus  (insulin  glargine). Patient is  able to verbalize appropriate hypoglycemia management plan. Medication adherence appears good. Patient comes to office today for CGM assistance as her reader has not been working and she was in need of a new sensor.   - New sensor and CGM READER provided as sample.  -Continued Lantus  (insulin  glargine) at 18 units daily  -Patient educated on purpose, proper use, and potential adverse effects.  -Extensively discussed pathophysiology of diabetes, recommended lifestyle interventions, dietary effects on blood sugar control.  -Counseled on s/sx of and management of hypoglycemia.   ASCVD risk - primary  prevention in patient with diabetes. Last LDL is 95 not at goal of <70  mg/dL. ASCVD risk factors include diabetes and 10-year ASCVD risk score of 59.2. high intensity statin indicated. Patient reports that she is out of the medication and refill was sent to pharmacy.  -Continued Atorvastatin  80 mg   Hypertension longstanding currently controlled with in office pressure of 124/86. Blood pressure goal of <130/80  mmHg. Medication adherence is good. Patient reports that she is out of the medication and refill was sent to pharmacy.  -Continued  Valsartan -hydrochlorothiazide  160-25 mg  Written patient instructions provided. Patient verbalized understanding of treatment plan.  Total time in face to face counseling 35 minutes.    Follow-up:  PCP clinic visit in 06/23/2024 Patient seen with Lawson Mao, PharmD Candidate - PY3 student

## 2024-06-12 NOTE — Assessment & Plan Note (Signed)
 Hypertension longstanding currently controlled with in office pressure of 124/86. Blood pressure goal of <130/80  mmHg. Medication adherence is good. Patient reports that she is out of the medication and refill was sent to pharmacy.  -Continued  Valsartan -hydrochlorothiazide  160-25 mg.

## 2024-06-12 NOTE — Progress Notes (Signed)
 Reviewed and agree with Dr Rennis plan.

## 2024-06-23 ENCOUNTER — Ambulatory Visit: Payer: Self-pay | Admitting: Family Medicine

## 2024-06-23 NOTE — Progress Notes (Deleted)
  Patient Name: Deborah Sampson Date of Birth: 1961-05-30 Date of Visit: 06/23/24 PCP: Donzetta Rollene BRAVO, MD  Chief Complaint: form completion   Subjective: Deborah Sampson is a pleasant 63 y.o. with medical history significant for T2DM, GERD, HTN presenting today for completion of N-648 form.   The patient speaks Cheryl as their primary language.  An interpreter was used for the entire visit.   The purpose of this visit was explained with to the patient and family members. The patient was interviewed alone.   Identification confirmed and documented on N-648.   Refugee Health Screener-15 Score: *** Distress thermometer: *** RUDAS Score: ***  (22 or less indicates cognitive impairment)   Independent with ADL Function:  Ambulating: {yes/no:20286::Yes} Feeding:{yes/no:20286::Yes} Bathing:{yes/no:20286::Yes} Dressing:{yes/no:20286::Yes} Toileting: {yes/no:20286::Yes} Transferring: {yes/no:20286::Yes}  Independent with Instrumental ADL Function   Finances:{yes/no:20286::Yes} Transportation: {yes/no:20286::Yes} Meal preparation: {yes/no:20286::Yes} Household chores: {yes/no:20286::Yes} Communication with others: {yes/no:20286::Yes} Medications: {yes/no:20286::Yes}   PMH:  T2DM HTN GERD   PSH: Appendectomy  Social History: Witness to trauma: *** Witness to violence: *** Years in [place] : *** Prior number of attempts to attain citizenship: *** Have you failed citizenship test before? *** Have you previously taken English classes? ***     ROS: Per HPI.   I have reviewed the patient's medical, surgical, family, and social history as appropriate.  There were no vitals filed for this visit. ***  HEENT: Sclera anicteric. Dentition is moderate. Appears well hydrated. Neck: Supple Cardiac: Regular rate and rhythm. Normal S1/S2. No murmurs, rubs, or gallops appreciated. Lungs: Clear bilaterally to ascultation.  Abdomen: Normoactive bowel sounds. No  tenderness to deep or light palpation. No rebound or guarding.  Extremities: Warm, well perfused without edema.  Skin: Warm, dry Psych: Pleasant and appropriate   There are no diagnoses linked to this encounter.   Patient signed [phone] Interpreter signed [phone]    Rollene Donzetta, MD  [facility] Teaching Service

## 2024-07-10 ENCOUNTER — Ambulatory Visit: Payer: Self-pay | Admitting: Pharmacist

## 2024-07-12 ENCOUNTER — Other Ambulatory Visit (HOSPITAL_COMMUNITY): Payer: Self-pay

## 2024-08-11 ENCOUNTER — Other Ambulatory Visit (HOSPITAL_COMMUNITY): Payer: Self-pay

## 2024-08-11 ENCOUNTER — Encounter: Payer: Self-pay | Admitting: Family Medicine

## 2024-08-11 ENCOUNTER — Ambulatory Visit: Payer: Self-pay | Admitting: Family Medicine

## 2024-08-11 VITALS — BP 115/62 | HR 72 | Ht 65.0 in | Wt 188.8 lb

## 2024-08-11 DIAGNOSIS — E1165 Type 2 diabetes mellitus with hyperglycemia: Secondary | ICD-10-CM

## 2024-08-11 DIAGNOSIS — Z794 Long term (current) use of insulin: Secondary | ICD-10-CM

## 2024-08-11 DIAGNOSIS — E781 Pure hyperglyceridemia: Secondary | ICD-10-CM

## 2024-08-11 DIAGNOSIS — I10 Essential (primary) hypertension: Secondary | ICD-10-CM

## 2024-08-11 LAB — POCT GLYCOSYLATED HEMOGLOBIN (HGB A1C): HbA1c, POC (controlled diabetic range): 8 % — AB (ref 0.0–7.0)

## 2024-08-11 MED ORDER — FREESTYLE LIBRE 3 PLUS SENSOR MISC
2 refills | Status: AC
  Filled 2024-08-11: qty 2, 30d supply, fill #0

## 2024-08-11 NOTE — Assessment & Plan Note (Addendum)
 At goal, Continue valsartan -hydrochlorothiazide , BMP up to date

## 2024-08-11 NOTE — Progress Notes (Signed)
" ° ° °  SUBJECTIVE:   CHIEF COMPLAINT / HPI:   Discussed the use of AI scribe software for clinical note transcription with the patient, who gave verbal consent to proceed.  History of Present Illness Deborah Sampson is a 64 year old female with diabetes and hypertension who presents for a follow-up visit.  Hyperglycemia and diabetes management - Uses 18 units of insulin  daily and CGM readings show: Glucose rises into the 200s after large meals and up to 300s after eating cassava; attempts to lower glucose by drinking water. - Has experienced low glucose readings around 90. - Has run out of glucose sensors and requires additional supply.  Hypertension management - Continues valsartan  hydrochlorothiazide  daily. - Has not checked blood pressure recently.  Hyperlipidemia management - Recently started atorvastatin  80 mg daily after last visit for cholesterol.  Gastroesophageal reflux symptoms - Intermittently uses an acid reducer, either Protonix  or Pepcid , for acid symptoms. - Uncertain which acid reducer is currently being taken.  Cold exposure and clothing needs - Works in a cold environment requiring steel-toe boots; current boots are uncomfortable and cold. - Requests prescription for different boots due to discomfort and cold exposure. - Requests assistance obtaining warm clothing (socks, gloves, hats) for herself and her two children due to cold weather.     PERTINENT  PMH / PSH: T2DM on insulin , HLD, hypertriglyceridemia, PUD, HTN  OBJECTIVE:   BP 115/62   Pulse 72   Ht 5' 5 (1.651 m)   Wt 188 lb 12.8 oz (85.6 kg)   LMP 09/07/2018   SpO2 100%   BMI 31.42 kg/m   Physical Exam General: A&O, NAD HEENT: No sign of trauma, EOM grossly intact Cardiac: RRR, no m/r/g Respiratory: CTAB, normal WOB, no w/c/r GI: Soft, NTTP, non-distended  Extremities: NTTP, no peripheral edema. Neuro: Normal gait, moves all four extremities appropriately. Psych: Appropriate mood and  affect       ASSESSMENT/PLAN:   Assessment & Plan Type 2 diabetes mellitus with hyperglycemia, with long-term current use of insulin  (HCC) On Lantus  18 units daily, A1c today was 8.54m but with fastings in 90s do not want to increase basal due to risk of hypoglycemia - - Advised dietary modifications: smaller portions of cassava, pair with protein. Hypertriglyceridemia Recheck lipids today, continue atorvastatin  80 mg daily Essential hypertension At goal, Continue valsartan -hydrochlorothiazide , BMP up to date   General Health Maintenance Due for repeat Pap smear. Discussed importance for cervical cancer screening. - Scheduled Pap smear for February 9th at 8:50 AM.  Refugee status- may qualify for [phone] exam. Will schedule longer visit on 2/9 as she missed prior appt to discuss and evaluate for N648. Will request in person interpretor.     Deborah FORBES Keeling, MD [facility] "

## 2024-08-11 NOTE — Patient Instructions (Addendum)
 It was wonderful to see you today.  Please bring ALL of your medications with you to every visit.    Our plans for today:   - Today we checked your A1c and lipids VISIT SUMMARY: Today, we discussed your diabetes, hypertension, cholesterol, acid reflux, and cold exposure. We reviewed your medications and made some adjustments to your treatment plan.  YOUR PLAN: TYPE 2 DIABETES MELLITUS: Your blood sugar levels are fluctuating, especially after meals. -Continue taking 18 units of insulin  daily. -Try to eat smaller portions of cassava and pair it with protein to help manage your blood sugar levels. -We will ensure you have a supply of glucose sensors.  ESSENTIAL HYPERTENSION: Your blood pressure is well-controlled with your current medication. -Continue taking valsartan  hydrochlorothiazide  once daily.  HYPERTRIGLYCERIDEMIA: You are currently taking atorvastatin  to manage your cholesterol levels. -Continue taking atorvastatin  80 mg daily. -We have ordered blood work to check your cholesterol levels.  GASTROESOPHAGEAL REFLUX SYMPTOMS: You have been experiencing acid reflux and are unsure which medication you are currently taking. -Continue using either Protonix  or Pepcid  as needed for acid symptoms.  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 -We will provide a prescription for different boots to help with your discomfort and cold exposure. -We will assist you in obtaining warm clothing for you and your children.  GENERAL HEALTH MAINTENANCE: You are due for a repeat Pap smear. -Your Pap smear is scheduled for February 9th at 8:50 AM.    Contains text generated by [employer].   Thank you for choosing [facility].   Please call [phone] with any questions about today's appointment.  Please arrive at least 15 minutes prior to your scheduled appointments.   If you had blood work today, I will send you a  MyChart message or a letter if results are normal. Otherwise, I will give you a call.   If you had a referral placed, they will call you to set up an appointment. Please give us  a call if you don't hear back in the next 2 weeks.   If you need additional refills before your next appointment, please call your pharmacy first.  Don't forget to check out the [facility] in [facility] at [address] [phone] Affordable prices on prescriptions and over-the-counter items, as well as services like vaccinations and medication home delivery.   Rollene Keeling, MD  Family Medicine

## 2024-08-11 NOTE — Assessment & Plan Note (Addendum)
 On Lantus  18 units daily, A1c today was 8.57m but with fastings in 90s do not want to increase basal due to risk of hypoglycemia - - Advised dietary modifications: smaller portions of cassava, pair with protein.

## 2024-08-12 LAB — LIPID PANEL
Chol/HDL Ratio: 6 ratio — ABNORMAL HIGH (ref 0.0–4.4)
Cholesterol, Total: 204 mg/dL — ABNORMAL HIGH (ref 100–199)
HDL: 34 mg/dL — ABNORMAL LOW
LDL Chol Calc (NIH): 101 mg/dL — ABNORMAL HIGH (ref 0–99)
Triglycerides: 408 mg/dL — ABNORMAL HIGH (ref 0–149)
VLDL Cholesterol Cal: 69 mg/dL — ABNORMAL HIGH (ref 5–40)

## 2024-08-15 ENCOUNTER — Telehealth: Payer: Self-pay | Admitting: Family Medicine

## 2024-08-15 ENCOUNTER — Ambulatory Visit: Payer: Self-pay | Admitting: Family Medicine

## 2024-08-15 ENCOUNTER — Other Ambulatory Visit (HOSPITAL_COMMUNITY): Payer: Self-pay

## 2024-08-15 DIAGNOSIS — E781 Pure hyperglyceridemia: Secondary | ICD-10-CM

## 2024-08-15 DIAGNOSIS — E1169 Type 2 diabetes mellitus with other specified complication: Secondary | ICD-10-CM

## 2024-08-15 MED ORDER — EZETIMIBE 10 MG PO TABS
10.0000 mg | ORAL_TABLET | Freq: Every day | ORAL | 3 refills | Status: AC
  Filled 2024-08-15: qty 90, 90d supply, fill #0

## 2024-08-15 NOTE — Telephone Encounter (Signed)
 Triglycerides improved but LDL still high 101. would like to add Zetia  10mg  daily while continuing atorvastatin  80 mg.    Called patient to discuss labs with Kinyarwanda interpretor, Explained to patient and answered all questions. Zetia  10 mg daily sent to pharmacy.    Rollene Donzetta Sar, MD

## 2024-08-28 ENCOUNTER — Ambulatory Visit: Payer: Self-pay | Admitting: Family Medicine
